# Patient Record
Sex: Female | Born: 1950
Health system: Southern US, Community
[De-identification: ages and names within clinical notes are randomized; demographics above are authoritative.]

## PROBLEM LIST (undated history)

## (undated) DIAGNOSIS — Z803 Family history of malignant neoplasm of breast: Secondary | ICD-10-CM

## (undated) DIAGNOSIS — I499 Cardiac arrhythmia, unspecified: Secondary | ICD-10-CM

## (undated) DIAGNOSIS — I10 Essential (primary) hypertension: Secondary | ICD-10-CM

## (undated) DIAGNOSIS — F419 Anxiety disorder, unspecified: Secondary | ICD-10-CM

## (undated) DIAGNOSIS — Z8049 Family history of malignant neoplasm of other genital organs: Secondary | ICD-10-CM

## (undated) DIAGNOSIS — N189 Chronic kidney disease, unspecified: Secondary | ICD-10-CM

## (undated) HISTORY — DX: Family history of malignant neoplasm of other genital organs: Z80.49

## (undated) HISTORY — DX: Hereditary hemochromatosis: E83.110

## (undated) HISTORY — DX: Family history of malignant neoplasm of breast: Z80.3

## (undated) HISTORY — DX: Essential (primary) hypertension: I10

## (undated) HISTORY — PX: WRIST SURGERY: SHX841

---

## 1981-07-04 HISTORY — PX: BREAST EXCISIONAL BIOPSY: SUR124

## 1998-12-30 ENCOUNTER — Other Ambulatory Visit: Admission: RE | Admit: 1998-12-30 | Discharge: 1998-12-30 | Payer: Self-pay | Admitting: Family Medicine

## 1999-09-10 ENCOUNTER — Encounter: Admission: RE | Admit: 1999-09-10 | Discharge: 1999-09-10 | Payer: Self-pay | Admitting: Family Medicine

## 1999-09-10 ENCOUNTER — Encounter: Payer: Self-pay | Admitting: Family Medicine

## 2000-09-11 ENCOUNTER — Encounter: Admission: RE | Admit: 2000-09-11 | Discharge: 2000-09-11 | Payer: Self-pay | Admitting: Family Medicine

## 2000-09-11 ENCOUNTER — Encounter: Payer: Self-pay | Admitting: Family Medicine

## 2001-07-11 ENCOUNTER — Other Ambulatory Visit: Admission: RE | Admit: 2001-07-11 | Discharge: 2001-07-11 | Payer: Self-pay | Admitting: Family Medicine

## 2001-07-12 ENCOUNTER — Encounter: Payer: Self-pay | Admitting: Family Medicine

## 2001-07-12 ENCOUNTER — Ambulatory Visit (HOSPITAL_COMMUNITY): Admission: RE | Admit: 2001-07-12 | Discharge: 2001-07-12 | Payer: Self-pay | Admitting: Family Medicine

## 2001-09-12 ENCOUNTER — Encounter: Payer: Self-pay | Admitting: Family Medicine

## 2001-09-12 ENCOUNTER — Encounter: Admission: RE | Admit: 2001-09-12 | Discharge: 2001-09-12 | Payer: Self-pay | Admitting: Family Medicine

## 2002-09-17 ENCOUNTER — Encounter: Payer: Self-pay | Admitting: Family Medicine

## 2002-09-17 ENCOUNTER — Encounter: Admission: RE | Admit: 2002-09-17 | Discharge: 2002-09-17 | Payer: Self-pay | Admitting: Family Medicine

## 2002-11-28 ENCOUNTER — Encounter: Payer: Self-pay | Admitting: Family Medicine

## 2002-11-28 ENCOUNTER — Encounter: Admission: RE | Admit: 2002-11-28 | Discharge: 2002-11-28 | Payer: Self-pay | Admitting: Family Medicine

## 2003-08-27 ENCOUNTER — Other Ambulatory Visit: Admission: RE | Admit: 2003-08-27 | Discharge: 2003-08-27 | Payer: Self-pay | Admitting: Family Medicine

## 2003-10-31 ENCOUNTER — Ambulatory Visit (HOSPITAL_COMMUNITY): Admission: RE | Admit: 2003-10-31 | Discharge: 2003-10-31 | Payer: Self-pay | Admitting: Gastroenterology

## 2004-08-27 ENCOUNTER — Other Ambulatory Visit: Admission: RE | Admit: 2004-08-27 | Discharge: 2004-08-27 | Payer: Self-pay | Admitting: Family Medicine

## 2005-02-11 ENCOUNTER — Encounter: Admission: RE | Admit: 2005-02-11 | Discharge: 2005-02-11 | Payer: Self-pay | Admitting: Family Medicine

## 2006-03-14 ENCOUNTER — Encounter: Admission: RE | Admit: 2006-03-14 | Discharge: 2006-03-14 | Payer: Self-pay | Admitting: Family Medicine

## 2007-03-19 ENCOUNTER — Encounter: Admission: RE | Admit: 2007-03-19 | Discharge: 2007-03-19 | Payer: Self-pay | Admitting: Family Medicine

## 2008-03-19 ENCOUNTER — Encounter: Admission: RE | Admit: 2008-03-19 | Discharge: 2008-03-19 | Payer: Self-pay | Admitting: Family Medicine

## 2009-03-20 ENCOUNTER — Encounter: Admission: RE | Admit: 2009-03-20 | Discharge: 2009-03-20 | Payer: Self-pay | Admitting: Family Medicine

## 2009-03-26 ENCOUNTER — Encounter: Admission: RE | Admit: 2009-03-26 | Discharge: 2009-03-26 | Payer: Self-pay | Admitting: Family Medicine

## 2009-04-27 ENCOUNTER — Other Ambulatory Visit: Admission: RE | Admit: 2009-04-27 | Discharge: 2009-04-27 | Payer: Self-pay | Admitting: Family Medicine

## 2009-09-18 ENCOUNTER — Encounter: Admission: RE | Admit: 2009-09-18 | Discharge: 2009-09-18 | Payer: Self-pay | Admitting: Family Medicine

## 2010-03-26 ENCOUNTER — Encounter: Admission: RE | Admit: 2010-03-26 | Discharge: 2010-03-26 | Payer: Self-pay | Admitting: Family Medicine

## 2010-11-19 NOTE — Op Note (Signed)
NAMEMCKENNA, GAMM                       ACCOUNT NO.:  [id number]   MEDICAL RECORD NO.:  [id number]                   PATIENT TYPE:  AMB   LOCATION:  ENDO                                 FACILITY:  [facility]   PHYSICIAN:  Petra Kuba, M.D.                 DATE OF BIRTH:  1951-04-24   DATE OF PROCEDURE:  10/31/2003  DATE OF DISCHARGE:                                 OPERATIVE REPORT   PROCEDURE:  Colonoscopy.   INDICATIONS FOR PROCEDURE:  Screening.   Consent was signed after risks, benefits, methods, and options were  thoroughly discussed in the office.   MEDICINES USED:  Demerol 70, Versed 7.   DESCRIPTION OF PROCEDURE:  Rectal inspection was pertinent for external  hemorrhoids, small. Digital exam was negative. The pediatric video  adjustable colonoscope was inserted and with abdominal pressure able to be  advanced around the colon to the cecum.  No abnormality was seen on  insertion. The cecum was identified by the appendiceal orifice and the  ileocecal valve. The prep was adequate. There was minimal liquid stool that  required washing and suctioning. On slow withdrawal through the colon, no  abnormalities were seen specifically no polyps, tumors, masses or  diverticula.  Once back in the rectum, anal rectal pullthrough and  retroflexion confirmed some small hemorrhoids.  The scope was reinserted a  short ways up the left side of the colon, air was suctioned, scope removed.  The patient tolerated the procedure well. There was no obvious or immediate  complications.   ENDOSCOPIC DIAGNOSIS:  1. Internal and external small hemorrhoids.  2. Otherwise within normal limits to the cecum.   PLAN:  Yearly rectals and guaiacs per Dr. Cliffton Asters.  Happy to see back p.r.n.  otherwise repeat screening in five years.                                               Petra Kuba, M.D.    MEM/MEDQ  D:  10/31/2003  T:  10/31/2003  Job:  [id number]   cc:   Stacie Acres. White, M.D.  [address]  [place]  [place] [address]  Fax: [phone]

## 2011-03-08 ENCOUNTER — Other Ambulatory Visit: Payer: Self-pay | Admitting: Family Medicine

## 2011-03-08 DIAGNOSIS — Z1231 Encounter for screening mammogram for malignant neoplasm of breast: Secondary | ICD-10-CM

## 2011-03-29 ENCOUNTER — Ambulatory Visit
Admission: RE | Admit: 2011-03-29 | Discharge: 2011-03-29 | Disposition: A | Discharge status: Home / Self Care | Payer: PRIVATE HEALTH INSURANCE | Source: Ambulatory Visit | Attending: Family Medicine | Admitting: Family Medicine

## 2011-03-29 DIAGNOSIS — Z1231 Encounter for screening mammogram for malignant neoplasm of breast: Secondary | ICD-10-CM

## 2012-04-12 ENCOUNTER — Other Ambulatory Visit: Payer: Self-pay | Admitting: Family Medicine

## 2012-04-12 DIAGNOSIS — Z1231 Encounter for screening mammogram for malignant neoplasm of breast: Secondary | ICD-10-CM

## 2012-05-09 ENCOUNTER — Ambulatory Visit
Admission: RE | Admit: 2012-05-09 | Discharge: 2012-05-09 | Disposition: A | Discharge status: Home / Self Care | Payer: BC Managed Care – PPO | Source: Ambulatory Visit | Attending: Family Medicine | Admitting: Family Medicine

## 2012-05-09 DIAGNOSIS — Z1231 Encounter for screening mammogram for malignant neoplasm of breast: Secondary | ICD-10-CM

## 2013-08-09 ENCOUNTER — Other Ambulatory Visit: Payer: Self-pay

## 2013-08-09 DIAGNOSIS — Z1231 Encounter for screening mammogram for malignant neoplasm of breast: Secondary | ICD-10-CM

## 2013-08-28 ENCOUNTER — Ambulatory Visit
Admission: RE | Admit: 2013-08-28 | Discharge: 2013-08-28 | Disposition: A | Discharge status: Home / Self Care | Payer: Managed Care, Other (non HMO) | Source: Ambulatory Visit

## 2013-08-28 DIAGNOSIS — Z1231 Encounter for screening mammogram for malignant neoplasm of breast: Secondary | ICD-10-CM

## 2013-11-20 ENCOUNTER — Other Ambulatory Visit (HOSPITAL_COMMUNITY)
Admission: RE | Admit: 2013-11-20 | Discharge: 2013-11-20 | Disposition: A | Discharge status: Home / Self Care | Payer: 59 | Source: Ambulatory Visit | Attending: Family Medicine | Admitting: Family Medicine

## 2013-11-20 ENCOUNTER — Other Ambulatory Visit: Payer: Self-pay | Admitting: Family Medicine

## 2013-11-20 DIAGNOSIS — Z0000 Encounter for general adult medical examination without abnormal findings: Secondary | ICD-10-CM | POA: Diagnosis not present

## 2013-12-05 ENCOUNTER — Telehealth: Payer: Self-pay | Admitting: Genetic Counselor

## 2013-12-05 NOTE — Telephone Encounter (Signed)
LEFT MESSAGE FOR PATIENT TO RETURN CALL TO SCHEDULE GENETIC APPT.  °

## 2013-12-11 ENCOUNTER — Telehealth: Payer: Self-pay | Admitting: Genetic Counselor

## 2013-12-11 NOTE — Telephone Encounter (Signed)
LEFT MESSAGE FOR PATIENT TO RETURN CALL TO SCHEDULE GENETIC APPT.  °

## 2013-12-24 ENCOUNTER — Telehealth: Payer: Self-pay | Admitting: Genetic Counselor

## 2013-12-24 NOTE — Telephone Encounter (Signed)
LEFT MESSAGE FOR PATIENT TO RETURN CALL TO SCHEDULE GENETIC APPT.  °

## 2014-01-30 ENCOUNTER — Encounter: Payer: Self-pay | Admitting: Genetic Counselor

## 2014-01-30 ENCOUNTER — Ambulatory Visit (HOSPITAL_BASED_OUTPATIENT_CLINIC_OR_DEPARTMENT_OTHER): Payer: Managed Care, Other (non HMO) | Admitting: Genetic Counselor

## 2014-01-30 ENCOUNTER — Other Ambulatory Visit: Payer: Managed Care, Other (non HMO)

## 2014-01-30 DIAGNOSIS — IMO0002 Reserved for concepts with insufficient information to code with codable children: Secondary | ICD-10-CM

## 2014-01-30 DIAGNOSIS — Z803 Family history of malignant neoplasm of breast: Secondary | ICD-10-CM | POA: Insufficient documentation

## 2014-01-30 DIAGNOSIS — Z8049 Family history of malignant neoplasm of other genital organs: Secondary | ICD-10-CM | POA: Insufficient documentation

## 2014-01-30 NOTE — Progress Notes (Signed)
Patient Name: Shelby Harrington Patient Age: 63 y.o. Encounter Date: 01/30/2014  Referring Physician: [name], MD    Shelby Harrington, a 63 y.o. female, is being seen at the [facility] due to a family history of breast and uterine cancers.  She presents to clinic today to discuss the possibility of a hereditary predisposition to cancer and discuss whether genetic testing is warranted.  HISTORY OF PRESENT ILLNESS: Shelby Harrington has no personal history of cancer and is in generally good health. She states she undergoes a yearly mammogram, clinical breast exam and gynecologic exam. She stated that a colonoscopy about 5 years ago revealed 3 polyps. We do not have pathology records available today.  Past Medical History  Diagnosis Date  . Family history of malignant neoplasm of breast   . Family history of uterine cancer     History   Social History  . Marital Status: Married    Spouse Name: N/A    Number of Children: N/A  . Years of Education: N/A   Social History Main Topics  . Smoking status: Not on file  . Smokeless tobacco: Not on file  . Alcohol Use: Not on file  . Drug Use: Not on file  . Sexual Activity: Not on file   Other Topics Concern  . Not on file   Social History Narrative  . No narrative on file     FAMILY HISTORY:   During the visit, a 4-generation pedigree was obtained. Significant diagnoses include the following:  Family History  Problem Relation Age of Onset  . Breast cancer Mother 43    deceased at 37  . Skin cancer Father     currently 32; no full siblings  . Uterine cancer Sister 71    currently 31  . Cancer Maternal Grandmother     "female cancer"; deceased 27    Shelby Harrington's only sister had uterine cancer at a young age (37). They are not in contact and she does not believe her sister had genetic testing. Shelby Harrington mother had no sisters; only two brothers. Her father had no full siblings at all.  Ms.  Harrington ancestry is English. There is no known Jewish ancestry and no consanguinity.  ASSESSMENT AND PLAN: Shelby Harrington is a 63 y.o. female with a family history of breast and uterine cancers. This history is not highly suggestive of a hereditary predisposition to cancer, but given her mother's young age at breast cancer diagnosis and paucity of women, genetic testing is recommended. We discussed BRCA1 and BRCA2, but also testing for Lynch syndrome due to her sister's uterine cancer and the "female cancer" she reported in her maternal grandmother. We reviewed the characteristics, features and inheritance patterns of hereditary cancer syndromes. We also discussed genetic testing, including the other appropriate family members to test (her sister), the process of testing, insurance coverage and implications of results.   Shelby Harrington wished to pursue genetic testing and a blood sample will be sent to [facility] for analysis of the 9 genes on the GYNPlus gene panel: BRCA1, BRCA2, EPCAM, MLH1, MSH2, MSH6, PMS2, PTEN, and TP53. We discussed the implications of a positive, negative and/ or Variant of Uncertain Significance (VUS) result. Results should be available in approximately 4-5 weeks, at which point we will contact her and address implications for her as well as address genetic testing for at-risk family members, if needed.    We encouraged Shelby Harrington to remain in contact with Cancer Genetics  annually so that we can update the family history and inform her of any changes in cancer genetics and testing that may be of benefit for this family. Ms.  Harrington questions were answered to her satisfaction today.   Thank you for the referral and allowing [place] to share in the care of your patient.   The patient was seen for a total of 30 minutes, greater than 50% of which was spent face-to-face counseling. This patient was discussed with the overseeing provider who agrees with the above.   Shelby Berg,  MS, [place] Certified Genetic Counseor phone: [phone] [email or web address]

## 2014-02-21 ENCOUNTER — Encounter: Payer: Self-pay | Admitting: Genetic Counselor

## 2014-02-21 NOTE — Progress Notes (Signed)
Referring Physician: [name], MD   Shelby Harrington was called today to discuss genetic test results. Please see the Genetics note from her visit on 01/30/14 for a detailed discussion of her personal and family history.  GENETIC TESTING: At the time of Shelby Harrington's visit, we recommended she pursue genetic testing of multiple genes on the GYNplus gene panel. This test, which included sequencing and deletion/duplication analysis of 9 genes, was performed at [facility]. Testing was normal and did not reveal a mutation in these genes. The genes tested were BRCA1, BRCA2, EPCAM, MLH1, MSH2, MSH6, PMS2, PTEN, and TP53.  We discussed with Shelby Harrington that since the current test is not perfect, it is possible there may be a gene mutation that current testing cannot detect, but that chance is small. We also discussed that it is possible that a different genetic factor, which was not part of this testing or has not yet been discovered, is responsible for the cancer diagnoses in the family. Lastly, it may be that there is an identifiable mutation in her family that she did not inherit. We continue to recommend that her sister who had uterine cancer at age 62 get tested. Unfortunately, Shelby Harrington is estranged from her sister.  CANCER SCREENING: This normal result is generally reassuring and indicates that Shelby Harrington does not likely have an increased risk of cancer due to a mutation in one of these genes. However, her risk is elevated about that of the general population simply due to the family history. We recommended Shelby Harrington continue to follow the cancer screening guidelines provided by her primary physician which includes a yearly mammogram, clinical breast exam and gynecologic exam. She stated that her colonoscopy 5 years ago revealed 3 polyps. We will defer to her GI as to when to have her next screening.  FAMILY MEMBERS: As stated, we recommended further genetic testing in Shelby Harrington family  as such testing might help [place] be even more confident in interpreting Shelby Harrington's own results. Genetic testing is best initiated in someone who has had cancer.  In this family, her sister who had uterine cancer would be the most informative person to test. Please let us know if we can help facilitate testing. Genetic counselors can be located in other cities, by visiting the website of the [employer] (ArtistMovie.se) and Field seismologist for a Dietitian by zip code.  Lastly, we discussed with Shelby Harrington that cancer genetics is a rapidly advancing field and it is possible that new genetic tests will be appropriate for her in the future. We encouraged her to remain in contact with [place] on an annual basis so we can update her personal and family histories, and let her know of advances in cancer genetics that may benefit the family. Our contact number was provided. Shelby Harrington questions were answered to her satisfaction today, and she knows she is welcome to call anytime with additional questions.    Steele Berg, MS, [place] Certified Genetic Counseor phone: [phone] [email or web address]

## 2014-05-21 ENCOUNTER — Encounter: Payer: Self-pay | Admitting: *Deleted

## 2014-10-13 ENCOUNTER — Other Ambulatory Visit: Payer: Self-pay

## 2014-10-13 DIAGNOSIS — Z1231 Encounter for screening mammogram for malignant neoplasm of breast: Secondary | ICD-10-CM

## 2014-10-22 ENCOUNTER — Ambulatory Visit
Admission: RE | Admit: 2014-10-22 | Discharge: 2014-10-22 | Disposition: A | Discharge status: Home / Self Care | Payer: 59 | Source: Ambulatory Visit

## 2014-10-22 DIAGNOSIS — Z1231 Encounter for screening mammogram for malignant neoplasm of breast: Secondary | ICD-10-CM

## 2015-02-05 ENCOUNTER — Encounter: Payer: Self-pay | Admitting: Physical Therapy

## 2015-02-05 ENCOUNTER — Ambulatory Visit: Payer: 59 | Attending: Urology | Admitting: Physical Therapy

## 2015-02-05 DIAGNOSIS — N8189 Other female genital prolapse: Secondary | ICD-10-CM

## 2015-02-05 NOTE — Therapy (Signed)
[facility] [address], [place], [place], [address] Phone: [phone]   Fax:  [phone]  Physical Therapy Evaluation  Patient Details  Name: Shelby Harrington MRN: [id number] Date of Birth: 1951-02-05 Referring Provider:  Bjorn Loser, MD  Encounter Date: 02/05/2015      PT End of Session - 02/05/15 1308    Visit Number 1   Date for PT Re-Evaluation 04/30/15   PT Start Time 1230   PT Stop Time 1308   PT Time Calculation (min) 38 min   Activity Tolerance Patient tolerated treatment well   Behavior During Therapy [facility] for tasks assessed/performed      Past Medical History  Diagnosis Date  . Family history of malignant neoplasm of breast   . Family history of uterine cancer   . Hypertension     History reviewed. No pertinent past surgical history.  There were no vitals filed for this visit.  Visit Diagnosis:  Pelvic floor weakness in female - Plan: PT plan of care cert/re-cert      Subjective Assessment - 02/05/15 1237    Subjective Patient reports urinary leakage for 5 years.  Patient reports leakage is getting worse over time and tired of dealing of it. Patient wears 1 pad per day. Pain with intercourse due to dryness.    Patient Stated Goals No leakage of urine   Currently in Pain? Yes   Pain Score 2    Pain Location Vagina   Pain Orientation Mid   Pain Descriptors / Indicators --  friction   Pain Type Chronic pain   Pain Onset More than a month ago   Pain Frequency Intermittent   Aggravating Factors  intercourse   Pain Relieving Factors no intercourse   Multiple Pain Sites No            [facility] PT Assessment - 02/05/15 0001    Assessment   Medical Diagnosis N39.3 female stress incontinence   Onset Date/Surgical Date 07/04/09   Prior Therapy None   Precautions   Precautions None   Balance Screen   Has the patient fallen in the past 6 months No   Has the patient had a decrease in  activity level because of a fear of falling?  No   Is the patient reluctant to leave their home because of a fear of falling?  No   Prior Function   Level of Independence Independent   Vocation Full time employment   Vocation Requirements sitting   Leisure recumbent bike 3-4x/wk for 35-45 min.    Cognition   Overall Cognitive Status Within Functional Limits for tasks assessed   Observation/Other Assessments   Focus on Therapeutic Outcomes (FOTO)  47% limitation for urinary problem   Posture/Postural Control   Posture/Postural Control Postural limitations   Postural Limitations Rounded Shoulders;Forward head   ROM / Strength   AROM / PROM / Strength AROM;Strength   AROM   Lumbar Extension decreased by 25%   Strength   Right Hip External Rotation  --  4/5   Right Hip Internal Rotation  --  4/5   Left Hip External Rotation  --  4/5   Left Hip Internal Rotation  --  4/5   Palpation   Palpation comment Pelvis in correct alignment   Balance   Balance Assessed --  single leg stance bil. 3 sec.                 Pelvic Floor Special Questions -  02/05/15 0001    Prior Pregnancies Yes   Number of Pregnancies 2   Number of Vaginal Deliveries 2   Episiotomy Performed Yes   Currently Sexually Active Yes   Is this Painful Yes  not all the time   Marinoff Scale discomfort that does not affect completion   Urinary Leakage Yes   Pad use 1   Activities that cause leaking Coughing;Sneezing   Urinary urgency Yes  when get up to go to the bathroom she waited too long   Urinary frequency every 3 hours   Fluid intake drinks mostly water   Falling out feeling (prolapse) No   Pelvic Floor Internal Exam Patient confirms identification and approves physical therapist to assess pelvic floor muscle strength and muscle integrity.    Exam Type Vaginal   Palpation tenderness located on right iliococcygeus and right puborectalis   Strength weak squeeze, no lift   Strength # of reps 3    Strength # of seconds 4                  PT Education - 02/05/15 1300    Education provided Yes   Education Details pelvic floor contraction; bladder irritants   Person(s) Educated Patient   Methods Explanation;Demonstration;Tactile cues;Verbal cues;Handout   Comprehension Verbalized understanding;Returned demonstration          PT Short Term Goals - 02/05/15 1315    PT SHORT TERM GOAL #1   Title understand what bladder irritants are and how they affect the bladder.    Time 4   Period Weeks   Status New   PT SHORT TERM GOAL #2   Title pain with intercourse decreased >/= 25%   Time 4   Period Weeks   Status New           PT Long Term Goals - 02/05/15 1316    PT LONG TERM GOAL #1   Title independent with HEP and understand how to progress herself.    Time 12   Period Weeks   Status New   PT LONG TERM GOAL #2   Title urinary leakage with coughing and sneezing decreased >/= 75%   Time 12   Period Weeks   Status New   PT LONG TERM GOAL #3   Title not have to wear a pad due to decreased urinary leakage   Time 12   Period Weeks   Status New   PT LONG TERM GOAL #4   Title pain with intercourse decreased >/= 50%   Time 12   Period Weeks   Status New   PT LONG TERM GOAL #5   Title pelvic floor strength >/= 4/5   Time 12   Period Weeks   Status New               Plan - 02/05/15 1310    Clinical Impression Statement Patient is a 64 year old female with stress incontinence for the past 5 years.  Patient reports she has pain with intercourse at level 2/10 vaginally due to dryness.  Pelvic floor strength is 2/5 with more posterior contraction and when patient laughs she will bear down.  Palpable tenderness located in right iliococcygeus and right puborectalis and posterior introitus. Patient reports she wears 1 pad per day. Patient leaks urine with coughing and sneezing and when she has been waiting to go to the bathroom too long. FOTO score for urinary  problem is 47% limitation.  Patient would benefit from physical  therapy to improve pelvic floor strenth to reduce urinary leakage and decrease pelvic pain with intercourse.    Pt will benefit from skilled therapeutic intervention in order to improve on the following deficits Pain;Decreased strength   Rehab Potential Excellent   PT Frequency 1x / week   PT Duration 12 weeks   PT Treatment/Interventions Biofeedback;Neuromuscular re-education;Therapeutic exercise;Therapeutic activities;Patient/family education;Manual techniques         Problem List Patient Active Problem List   Diagnosis Date Noted  . Family history of malignant neoplasm of breast   . Family history of uterine cancer     Londell Noll,PT 02/05/2015, 1:20 PM  [facility] [address], [place], [place], [address] Phone: [phone]   Fax:  [phone]

## 2015-02-05 NOTE — Patient Instructions (Signed)
Adduction: Hip - Knees Together (Hook-Lying)   Lie with hips and knees bent, towel roll between knees. Push knees together. Hold for _5__ seconds. Rest for _5__ seconds. Repeat _10__ times. Do _3__ times a day.  Then 5 quick flicks Copyright  VHI. All rights reserved.  Certain foods and liquids will decrease the pH making the urine more acidic.  Urinary urgency increases when the urine has a low pH.  Most common irritants: alcohol, carbonated beverages and caffinated beverages.  Foods to avoid: apple juice, apples, ascorbic acid, canteloupes, chili, citrus fruits, coffee, cranberries, grapes, guava, peaches, pepper, pineapple, plums, strawberries, tea, tomatoes, and vinegar.  Drinking plenty of water may help to increase the pH and dilute out any of the effects of specific irritants.  Foods that are NOT irritating to the bladder include: Pears, papayas, sun-brewed teas, watermelons, non-citrus herbal teas, apricots, kava and low-acid instant drinks (Postum)  [facility] [address], [place], [place] [address] Phone # [phone] Fax [phone]

## 2015-02-19 ENCOUNTER — Ambulatory Visit: Payer: 59 | Admitting: Physical Therapy

## 2015-02-19 ENCOUNTER — Encounter: Payer: Self-pay | Admitting: Physical Therapy

## 2015-02-19 DIAGNOSIS — N8189 Other female genital prolapse: Secondary | ICD-10-CM | POA: Diagnosis not present

## 2015-02-19 NOTE — Therapy (Signed)
[facility] [address] [place], [place], [address] Phone: [phone]   Fax:  [phone]  Physical Therapy Treatment  Patient Details  Name: Shelby Harrington MRN: [id number] Date of Birth: [birth year] Referring Provider:  Bjorn Loser, MD  Encounter Date: 02/19/2015      PT End of Session - 02/19/15 0802    Visit Number 2   Date for PT Re-Evaluation 04/30/15   PT Start Time 0800   PT Stop Time 0840   PT Time Calculation (min) 40 min   Activity Tolerance Patient tolerated treatment well   Behavior During Therapy [facility] for tasks assessed/performed      Past Medical History  Diagnosis Date  . Family history of malignant neoplasm of breast   . Family history of uterine cancer   . Hypertension     History reviewed. No pertinent past surgical history.  There were no vitals filed for this visit.  Visit Diagnosis:  Pelvic floor weakness in female      Subjective Assessment - 02/19/15 0803    Subjective I was only able to do exercises 2 times per day. My leakage feels better. My pads are more dry   Patient Stated Goals No leakage of urine   Currently in Pain? Yes   Pain Score 2    Pain Location Vagina   Pain Orientation Mid   Pain Descriptors / Indicators --  friction    Pain Type Chronic pain   Pain Onset More than a month ago   Pain Frequency Intermittent   Aggravating Factors  intercourse   Pain Relieving Factors No intercourse   Multiple Pain Sites No                      Pelvic Floor Special Questions - 02/19/15 0001    Pelvic Floor Internal Exam Patient confirms identification and approves physical therapist to assess pelvic floor muscle strength and muscle integrity.    Exam Type Vaginal   Palpation tenderness located in right obturator internist and puborectalis   Strength fair squeeze, definite lift  lift and circular           [facility] Adult PT Treatment/Exercise -  02/19/15 0001    Self-Care   Self-Care Other Self-Care Comments  lubricants and moisturizers for vaginal area, bladder irrita   Exercises   Exercises Lumbar   Lumbar Exercises: Supine   Ab Set 10 reps;5 seconds  pelvic floor contraction   Clam 10 reps  left, right, with pelvic floor contraction   Manual Therapy   Manual Therapy Internal Pelvic Floor   Internal Pelvic Floor to right obturator internist and puborectalis                PT Education - 02/19/15 0805    Education Details drinking water, bladder irritants, lubricants, moisturizers, pelvic floor contraction with abdominal bracing, pelvic floor contraction with clam, pelvic floor contraction with marching   Person(s) Educated Patient   Methods Explanation;Demonstration;Verbal cues;Handout   Comprehension Verbalized understanding;Returned demonstration          PT Short Term Goals - 02/19/15 0254    PT SHORT TERM GOAL #1   Title understand what bladder irritants are and how they affect the bladder.    Time 4   Period Weeks   Status Achieved   PT SHORT TERM GOAL #2   Title pain with intercourse decreased >/= 25%   Time 4   Period Weeks  Status On-going           PT Long Term Goals - 02/05/15 1316    PT LONG TERM GOAL #1   Title independent with HEP and understand how to progress herself.    Time 12   Period Weeks   Status New   PT LONG TERM GOAL #2   Title urinary leakage with coughing and sneezing decreased >/= 75%   Time 12   Period Weeks   Status New   PT LONG TERM GOAL #3   Title not have to wear a pad due to decreased urinary leakage   Time 12   Period Weeks   Status New   PT LONG TERM GOAL #4   Title pain with intercourse decreased >/= 50%   Time 12   Period Weeks   Status New   PT LONG TERM GOAL #5   Title pelvic floor strength >/= 4/5   Time 12   Period Weeks   Status New               Plan - 02/19/15 0834    Clinical Impression Statement Patient is a 64 year old  female with stress incontinence.  Patient pelvic floor strength has increased to 3/5 with circular contraction and lift.  Patient has achieved STG #1 due to understanding what bladder irritants are.  Patient is learning abdominal exercises. Patient reports decreased  urinary leakage but no change with coughing. Patient will benefit from physical therapy to increase pelvic floor strength and reduce urinary leakage.    Pt will benefit from skilled therapeutic intervention in order to improve on the following deficits Pain;Decreased strength   Rehab Potential Excellent   Clinical Impairments Affecting Rehab Potential None   PT Frequency 1x / week   PT Duration 12 weeks   PT Treatment/Interventions Biofeedback;Neuromuscular re-education;Therapeutic exercise;Therapeutic activities;Patient/family education;Manual techniques   PT Next Visit Plan soft tissue work, abdominal exercise, hip exercise   PT Home Exercise Plan progress as needed   Recommended Other Services None   Consulted and Agree with Plan of Care Patient        Problem List Patient Active Problem List   Diagnosis Date Noted  . Family history of malignant neoplasm of breast   . Family history of uterine cancer     Sharisse Rantz,PT 02/19/2015, 8:39 AM  [facility] [address] [place], [place], [address] Phone: [phone]   Fax:  [phone]

## 2015-02-19 NOTE — Patient Instructions (Addendum)
Lubrication . Used for intercourse to reduce friction . Avoid ones that have glycerin, warming gels, tingling gels, icing or cooling gel, scented . May need to be reapplied once or several times during sexual activity . Can be applied to both partners genitals prior to vaginal penetration to minimize friction or irritation . Prevent irritation and mucosal tears that cause post coital pain and increased the risk of vaginal and urinary tract infections . Oil-based lubricants cannot be used with condoms due to breaking them down.  Least likely to irritate vaginal tissue.  . Plant based-lubes are safe . Silicone-based lubrication are thicker and last long and used for post-menopausal women Types of Lubricants . Good Clean Love . Slippery Stuff . Sylk . Blossom Organics . Samul Dada . Coconut oil . Aloe Vera . Sliquid  Moisturizers . They are used in the vagina to hydrate the mucous membrane that make up the vaginal canal. . Designed to keep a more normal acid balance (ph) . Once placed in the vagina, it will last between two to three days.  . Use 2-3 times per week at bedtime and last longer than 60 min. . Ingredients to avoid is glycerin and fragrance, can increase chance of infection . Should not be used just before sex due to causing irritation . Most are gels administered either in a tampon-shaped applicator or as a vaginal suppository. They are non-hormonal.   Types of Moisturizers . Replens- inserted . Luvena- inserted . Vitamin E vaginal suppositories . Moist Again- inserted . Feminease- inserted . Coconut oil- can break down condoms . Hyalo- inserted  Things to avoid in the vaginal area . Do not use things to irritate the vulvar area . No lotions . No soaps; can use Aveeno or Calendula cleanser if needed. Must be gentle . No deodorants . No douches . Good to sleep without underwear to let the vaginal area to air out . No scrubbing: spread the lips to let warm water rinse  over labias and pat dry .   Lower abdominal/core stability exercises  1. Practice your breathing technique: Inhale through your nose expanding your belly and rib cage. Try not to breathe into your chest. Exhale slowly and gradually out your mouth feeling a sense of softness to your body. Practice multiple times. This can be performed unlimited.  2. Finding the lower abdominals. Laying on your back with the knees bent, place your fingers just below your belly button. Using your breathing technique from above, on your exhale gently pull the belly button away from your fingertips without tensing any other muscles. Practice this 5x. Next, as you exhale, draw belly button inwards and hold onto it...then feel as if you are pulling that muscle across your pelvis like you are tightening a belt. This can be hard to do at first so be patient and practice. Do 5-10 reps 1-3 x day. Always recognize quality over quantity; if your abdominal muscles become tired you will notice you may tighten/contract other muscles. This is the time to take a break.   Practice this first laying on your back, then in sitting, progressing to standing and finally adding it to all your daily movements.   3. Finding your pelvic floor. Using the breathing technique above, when your exhale, this time draw your pelvic floor muscles up as if you were attempting to stop the flow of urination. Be careful NOT to tense any other muscles. This can be hard, BE PATIENT. Try to hold up to 10 seconds  repeating 10x. Try 2x a day. Once you feel you are doing this well, add this contraction to exercise #2. First contracting your pelvic floor followed by lower abdominals.   4. Adding leg movements. Add the following leg movements to challenge your ability to keep your core stable:  1. Single leg drop outs: Laying on your back with knees bent feet flat. Inhale,  dropping one knee outward KEEPING YOUR PELVIS STILL. Exhale as you bring the leg back,  simultaneously performing your lower abdominal contraction. Do 5-10 on each leg.   2. Marching: While keeping your pelvis still, lift the right foot a few inches, put it down then lift left foot. This will mimic a march. Start slow to establish control. Once you have control you may speed it up. Do 10-20x. You MUST keep your lower abdominlas contracted while you march. Breathe naturally    [facility] [address], [place], [place] [address] Phone # [phone] Fax [phone]

## 2015-03-04 ENCOUNTER — Encounter: Payer: 59 | Admitting: Physical Therapy

## 2015-03-11 ENCOUNTER — Encounter: Payer: Self-pay | Admitting: Physical Therapy

## 2015-03-11 ENCOUNTER — Ambulatory Visit: Payer: 59 | Attending: Urology | Admitting: Physical Therapy

## 2015-03-11 DIAGNOSIS — N8189 Other female genital prolapse: Secondary | ICD-10-CM | POA: Diagnosis not present

## 2015-03-11 NOTE — Therapy (Signed)
[facility] [address] [place], [place], [address] Phone: [phone]   Fax:  [phone]  Physical Therapy Treatment  Patient Details  Name: Shelby Harrington MRN: [id number] Date of Birth: 1950-12-12 Referring Provider:  Bjorn Loser, MD  Encounter Date: 03/11/2015      PT End of Session - 03/11/15 1235    Visit Number 3   Date for PT Re-Evaluation 04/30/15   PT Start Time 1230   PT Stop Time 1310   PT Time Calculation (min) 40 min   Activity Tolerance Patient tolerated treatment well   Behavior During Therapy [facility] for tasks assessed/performed      Past Medical History  Diagnosis Date  . Family history of malignant neoplasm of breast   . Family history of uterine cancer   . Hypertension     History reviewed. No pertinent past surgical history.  There were no vitals filed for this visit.  Visit Diagnosis:  Pelvic floor weakness in female      Subjective Assessment - 03/11/15 1235    Subjective Things are good.  2 days last week without urinary leakage. Today is my last visit due to insurance not going to pay.    Patient Stated Goals No leakage of urine   Currently in Pain? No/denies            [facility] PT Assessment - 03/11/15 0001    Assessment   Medical Diagnosis N39.3 female stress incontinence   Onset Date/Surgical Date 07/04/09   Prior Therapy None   Precautions   Precautions None   Balance Screen   Has the patient fallen in the past 6 months No   Has the patient had a decrease in activity level because of a fear of falling?  No   Is the patient reluctant to leave their home because of a fear of falling?  No   Prior Function   Level of Independence Independent   Cognition   Overall Cognitive Status Within Functional Limits for tasks assessed   Observation/Other Assessments   Focus on Therapeutic Outcomes (FOTO)  46% limitation                              PT Education - 03/11/15 1302    Education provided Yes   Education Details pelvic floor strengthening and how to progress her exercises, how to use a tampon to see if she is contracting pelvic floor correctly, hip theraband exercises   Person(s) Educated Patient   Methods Explanation;Demonstration;Tactile cues;Verbal cues;Handout   Comprehension Returned demonstration;Verbalized understanding          PT Short Term Goals - 03/11/15 1313    PT SHORT TERM GOAL #1   Title understand what bladder irritants are and how they affect the bladder.    Time 4   Period Weeks   Status Achieved   PT SHORT TERM GOAL #2   Title pain with intercourse decreased >/= 25%   Time 4   Period Weeks   Status Achieved           PT Long Term Goals - 03/11/15 1305    PT LONG TERM GOAL #1   Title independent with HEP and understand how to progress herself.    Time 12   Period Weeks   Status Achieved   PT LONG TERM GOAL #2   Title urinary leakage with coughing and sneezing decreased >/= 75%  Time 12   Period Weeks   Status Achieved   PT LONG TERM GOAL #3   Title not have to wear a pad due to decreased urinary leakage   Time 12   Period Weeks   Status Not Met  1 pad   PT LONG TERM GOAL #4   Title pain with intercourse decreased >/= 50%   Time 12   Period Weeks   Status Achieved   PT LONG TERM GOAL #5   Title pelvic floor strength >/= 4/5   Time 12   Period Weeks   Status Partially Met               Problem List Patient Active Problem List   Diagnosis Date Noted  . Family history of malignant neoplasm of breast   . Family history of uterine cancer     Wing Gfeller,PT 03/11/2015, 1:15 PM  [facility] [address] [place], [place], [address] Phone: [phone]   Fax:  [phone]  PHYSICAL THERAPY DISCHARGE SUMMARY  Visits from Start of Care: 3 Current functional level related to goals / functional  outcomes: See above. Patient will wear 1 pad instead of no pads.  Pelvic floor strength is 3/5 instead of 4/5.    Remaining deficits: See above.    Education / Equipment: HEP Plan: Patient agrees to discharge.  Patient goals were partially met. Patient is being discharged due to financial reasons. Thank you for the referral. Earlie Counts, PT 03/11/2015 1:17 PM

## 2015-03-11 NOTE — Patient Instructions (Signed)
Quick Contraction: Gravity Resisted (Sitting)   Sitting, quickly squeeze then fully relax pelvic floor. Perform __1_ sets of _5__. Rest for __1_ seconds between sets. Do _2__ times a day.  Copyright  VHI. All rights reserved.  Slow Contraction: Gravity Resisted (Sitting)   Sitting, slowly squeeze pelvic floor for _10-15__ seconds. Rest for _5__ seconds. Repeat __10_ times. Do _3__ times a day.  Copyright  VHI. All rights reserved.   Once you can do the above exercises without difficulty progress to standing.  Quick Contraction: Gravity Resisted (Standing)   Standing, quickly squeeze then fully relax pelvic floor. Perform _1__ sets of _5__. Rest for _1__ seconds between sets. Do __2_ times a day.  Copyright  VHI. All rights reserved.  Slow Contraction: Gravity Resisted (Standing)   Standing, slowly squeeze pelvic floor for _10__ seconds. Rest for _5__ seconds. Repeat _10__ times. Do __2_ times a day.  Copyright  VHI. All rights reserved.    Functional tasks: Cough: Phase 4 (Standing)   Squeeze pelvic floor and hold. Inhale. Lean shoulders forward. Cough repeatedly. Relax. Repeat _3__ times. Do _1__ times a day.  Copyright  VHI. All rights reserved.  Laugh: Phase 4 (Sitting)   Squeeze pelvic floor and hold. Inhale. Lean shoulders forward. Laugh repeatedly. Relax. Repeat _3__ times. Do _1__ times a day.  Copyright  VHI. All rights reserved.  Sneeze: Phase 4 (Sitting)   Squeeze pelvic floor and hold. Inhale. Lean shoulders forward. Pretend to sneeze repeatedly. Relax. Repeat _3__ times. Do _1__ times a day.  Copyright  VHI. All rights reserved.      Strengthening: Hip Abduction - Resisted   With tubing around right leg, other side toward anchor, extend leg out from side. Repeat _10___ times per set. Do _1___ sets per session. Do __1__ sessions per day.  http://orth.exer.us/634   Copyright  VHI. All rights reserved.  Strengthening: Hip Extension -  Resisted   With tubing around right ankle, face anchor and pull leg straight back. Repeat __10__ times per set. Do ___1_ sets per session. Do __1__ sessions per day.  http://orth.exer.us/636   Copyright  VHI. All rights reserved.  Strengthening: Hip Adduction - Resisted   With tubing around left leg, bring leg across body. Repeat _10___ times per set. Do _1___ sets per session. Do _1___ sessions per day.  http://orth.exer.us/632   Copyright  VHI. All rights reserved.  [facility] [address] [place] [employer] [place], [place] [address] Phone # [phone] Fax [phone]

## 2015-08-04 ENCOUNTER — Other Ambulatory Visit: Payer: Self-pay | Admitting: Family Medicine

## 2015-08-04 ENCOUNTER — Ambulatory Visit
Admission: RE | Admit: 2015-08-04 | Discharge: 2015-08-04 | Disposition: A | Discharge status: Home / Self Care | Payer: 59 | Source: Ambulatory Visit | Attending: Family Medicine | Admitting: Family Medicine

## 2015-08-04 DIAGNOSIS — R059 Cough, unspecified: Secondary | ICD-10-CM

## 2015-08-04 DIAGNOSIS — R05 Cough: Secondary | ICD-10-CM

## 2015-08-25 ENCOUNTER — Other Ambulatory Visit: Payer: Self-pay | Admitting: Family Medicine

## 2015-08-25 ENCOUNTER — Ambulatory Visit
Admission: RE | Admit: 2015-08-25 | Discharge: 2015-08-25 | Disposition: A | Discharge status: Home / Self Care | Payer: 59 | Source: Ambulatory Visit | Attending: Family Medicine | Admitting: Family Medicine

## 2015-08-25 DIAGNOSIS — J189 Pneumonia, unspecified organism: Secondary | ICD-10-CM

## 2015-09-15 ENCOUNTER — Encounter: Payer: Self-pay | Admitting: Internal Medicine

## 2015-09-15 ENCOUNTER — Ambulatory Visit (INDEPENDENT_AMBULATORY_CARE_PROVIDER_SITE_OTHER): Payer: 59 | Admitting: Internal Medicine

## 2015-09-15 VITALS — BP 160/90 | HR 74 | Ht 59.75 in | Wt 162.0 lb

## 2015-09-15 DIAGNOSIS — J45991 Cough variant asthma: Secondary | ICD-10-CM | POA: Diagnosis not present

## 2015-09-15 DIAGNOSIS — R0600 Dyspnea, unspecified: Secondary | ICD-10-CM | POA: Diagnosis not present

## 2015-09-15 MED ORDER — BENZONATATE 200 MG PO CAPS: 200.0000 mg | ORAL_CAPSULE | Freq: Three times a day (TID) | ORAL | Status: DC | PRN

## 2015-09-15 NOTE — Progress Notes (Signed)
Subjective:    Patient ID: Shelby Harrington, female    DOB: [birth year]    MRN: [id number]  HPI  32 yowf never smoker ? 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    09/15/2015  1st [facility] office visit/ Shelby Harrington   Chief Complaint  Patient presents with  . Pulmonary Consult    Referred by Dr. [name]. Pt c/o SOB and cough x 1 yr. Cough has been worse for the past month. Cough is non prod and worse when she lies down and when she talks alot. She gets winded walking up and incline or doing housework such as making her bed.       doe chronic / daily   = MMRC1 = can walk nl pace, flat grade, can't hurry or go uphills or steps s sob  And so far no better on symbicort though hfa technique not ideal (see a/p) and on ppi but not sure of dose, taking pc  No obvious other patterns in day to day or daytime variabilty or assoc excess/ purulent sputum or mucus plugs   or cp or chest tightness, subjective wheeze overt sinus or hb symptoms. No unusual exp hx or h/o childhood pna/ asthma or knowledge of premature birth.  Sleeping ok without nocturnal  or early am exacerbation  of respiratory  c/o's or need for noct saba. Also denies any obvious fluctuation of symptoms with weather or environmental changes or other aggravating or alleviating factors except as outlined above   Current Medications, Allergies, Complete Past Medical History, Past Surgical History, Family History, and Social History were reviewed in [employer] record.              Review of Systems  Constitutional: Negative for  fever, chills and unexpected weight change.  HENT: Positive for sore throat. Negative for congestion, dental problem, ear pain, nosebleeds, postnasal drip, rhinorrhea, sinus pressure, sneezing, trouble swallowing and voice change.   Eyes: Negative for visual disturbance.  Respiratory: Positive for cough and shortness of breath. Negative for choking.   Cardiovascular: Negative for chest pain and leg swelling.  Gastrointestinal: Negative for vomiting, abdominal pain and diarrhea.  Genitourinary: Negative for difficulty urinating.  Musculoskeletal: Negative for arthralgias.  Skin: Negative for rash.  Neurological: Negative for tremors, syncope and headaches.  Hematological: Does not bruise/bleed easily.       Objective:   Physical Exam  amb wf nad  Wt Readings from Last 3 Encounters:  09/15/15 162 lb (73.483 kg)    Vital signs reviewed  HEENT: nl dentition, turbinates, and oropharynx. Nl external ear canals without cough reflex   NECK :  without JVD/Nodes/TM/ nl carotid upstrokes bilaterally   LUNGS: no acc muscle use,  Nl contour chest which is clear to A and P bilaterally without cough on insp or exp maneuvers   CV:  RRR  no s3 or murmur or increase in P2, no edema   ABD:  soft and nontender with nl inspiratory excursion in the supine position. No bruits or organomegaly, bowel sounds nl  MS:  Nl gait/ ext  warm without deformities, calf tenderness, cyanosis or clubbing No obvious joint restrictions   SKIN: warm and dry without lesions    NEURO:  alert, approp, nl sensorium with  no motor deficits       I personally reviewed images and agree with radiology impression as follows:  CXR:  08/25/15 No acute cardiopulmonary disease.        Assessment & Plan:

## 2015-09-15 NOTE — Patient Instructions (Addendum)
Omeprazole should be 40 mg Take 30-60 min before first meal of the day and take pepcid ac 20 mg one hour before bed with zyrtec   GERD (REFLUX)  is an extremely common cause of respiratory symptoms just like yours , many times with no obvious heartburn at all.    It can be treated with medication, but also with lifestyle changes including elevation of the head of your bed (ideally with 6 inch  bed blocks),  Smoking cessation, avoidance of late meals, excessive alcohol, and avoid fatty foods, chocolate, peppermint, colas, red wine, and acidic juices such as orange juice.  NO MINT OR MENTHOL PRODUCTS SO NO COUGH DROPS  USE SUGARLESS CANDY INSTEAD (Jolley ranchers or Stover's or Life Savers) or even ice chips will also do - the key is to swallow to prevent all throat clearing. NO OIL BASED VITAMINS - use powdered substitutes.  Continue symbicort 160 Take 2 puffs first thing in am and then another 2 puffs about 12 hours later (take pm dose at least 15 min before your exercise and if not better after 2 weeks stop it  Work on inhaler technique:  relax and gently blow all the way out then take a nice smooth deep breath back in, triggering the inhaler at same time you start breathing in.  Hold for up to 5 seconds if you can. Blow out thru nose. Rinse and gargle with water when done  For cough > tessilon 200 mg up to every 8 hours as needed (won't make you sleepy)  Please schedule a follow up office visit in 4 weeks, sooner if needed

## 2015-09-16 ENCOUNTER — Encounter: Payer: Self-pay | Admitting: Internal Medicine

## 2015-09-16 DIAGNOSIS — R0600 Dyspnea, unspecified: Secondary | ICD-10-CM | POA: Insufficient documentation

## 2015-09-16 DIAGNOSIS — J45991 Cough variant asthma: Secondary | ICD-10-CM | POA: Insufficient documentation

## 2015-09-16 NOTE — Assessment & Plan Note (Signed)
- The proper method of use, as well as anticipated side effects, of a metered-dose inhaler are discussed and demonstrated to the patient. Improved effectiveness after extensive coaching during this visit to a level of approximately 75 % from a baseline of 50 %   The most common causes of chronic cough in immunocompetent adults include the following: upper airway cough syndrome (UACS), previously referred to as postnasal drip syndrome (PNDS), which is caused by variety of rhinosinus conditions; (2) asthma; (3) GERD; (4) chronic bronchitis from cigarette smoking or other inhaled environmental irritants; (5) nonasthmatic eosinophilic bronchitis; and (6) bronchiectasis.   These conditions, singly or in combination, have accounted for up to 94% of the causes of chronic cough in prospective studies.   Other conditions have constituted no >6% of the causes in prospective studies These have included bronchogenic carcinoma, chronic interstitial pneumonia, sarcoidosis, left ventricular failure, ACEI-induced cough, and aspiration from a condition associated with pharyngeal dysfunction.    Chronic cough is often simultaneously caused by more than one condition. A single cause has been found from 38 to 82% of the time, multiple causes from 18 to 62%. Multiply caused cough has been the result of three diseases up to 42% of the time.       Based on hx and exam, this is most likely:   Cough variant asthma refractory to symbicort  vs  Upper airway cough syndrome, so named because it's frequently impossible to sort out how much is  CR/sinusitis with freq throat clearing (which can be related to primary GERD)   vs  causing  secondary (" extra esophageal")  GERD from wide swings in gastric pressure that occur with throat clearing, often  promoting self use of mint and menthol lozenges that reduce the lower esophageal sphincter tone and exacerbate the problem further in a cyclical fashion.   These are the same pts (now  being labeled as having "irritable larynx syndrome" by some cough centers) who not infrequently have a history of having failed to tolerate ace inhibitors,  dry powder inhalers or biphosphonates or report having atypical reflux symptoms that don't respond to standard doses of PPI , and are easily confused as having aecopd or asthma flares by even experienced allergists/ pulmonologists.   The first step is to maximize GERD Rx and eliminate cyclical coughing with tessilon if possible  then regroup in 4 weeks.  To sort out whether this is cough variant asthma rec max rx with symbicort x 2 weeks then try back off- may need methacholine challenge testing to sort out.  I had an extended discussion with the patient reviewing all relevant studies completed to date and  lasting 35 min  1) Explained: The standardized cough guidelines published in Chest by Lissa Morales in 2006 are still the best available and consist of a multiple step process (up to 12!) , not a single office visit,  and are intended  to address this problem logically,  with an alogrithm dependent on response to empiric treatment at  each progressive step  to determine a specific diagnosis with  minimal addtional testing needed. Therefore if adherence is an issue or can't be accurately verified,  it's very unlikely the standard evaluation and treatment will be successful here.    Furthermore, response to therapy (other than acute cough suppression, which should only be used short term with avoidance of narcotic containing cough syrups if possible), can be a gradual process for which the patient may perceive immediate benefit.  Unlike going  to an eye doctor where the best perscription is almost always the first one and is immediately effective, this is almost never the case in the management of chronic cough syndromes. Therefore the patient needs to commit up front to consistently adhere to recommendations  for up to 6 weeks of therapy directed at  the likely underlying problem(s) before the response can be reasonably evaluated.     2) Each maintenance medication was reviewed in detail including most importantly the difference between maintenance and prns and under what circumstances the prns are to be triggered using an action plan format that is not reflected in the computer generated alphabetically organized AVS.    Please see instructions for details which were reviewed in writing and the patient given a copy highlighting the part that I personally wrote and discussed at today's ov.   See instructions for specific recommendations which were reviewed directly with the patient who was given a copy with highlighter outlining the key components.

## 2015-09-16 NOTE — Assessment & Plan Note (Signed)
Reported lifelong and will need pfts to sort out once we have the cough under control but in the meantime encouraged continued daily (if possible) ex on recumbent bike which neutralizes the component that is related to obesity.

## 2015-09-21 ENCOUNTER — Encounter: Payer: Self-pay | Admitting: Hematology & Oncology

## 2015-09-30 ENCOUNTER — Other Ambulatory Visit: Payer: Self-pay

## 2015-09-30 DIAGNOSIS — Z1231 Encounter for screening mammogram for malignant neoplasm of breast: Secondary | ICD-10-CM

## 2015-10-08 ENCOUNTER — Ambulatory Visit: Payer: 59

## 2015-10-08 ENCOUNTER — Encounter: Payer: Self-pay | Admitting: Hematology & Oncology

## 2015-10-08 ENCOUNTER — Other Ambulatory Visit (HOSPITAL_BASED_OUTPATIENT_CLINIC_OR_DEPARTMENT_OTHER): Payer: 59

## 2015-10-08 ENCOUNTER — Ambulatory Visit (HOSPITAL_BASED_OUTPATIENT_CLINIC_OR_DEPARTMENT_OTHER): Payer: 59 | Admitting: Hematology & Oncology

## 2015-10-08 LAB — COMPREHENSIVE METABOLIC PANEL
ALBUMIN: 4 g/dL (ref 3.5–5.0)
ALK PHOS: 96 U/L (ref 40–150)
ALT: 29 U/L (ref 0–55)
AST: 28 U/L (ref 5–34)
Anion Gap: 8 mEq/L (ref 3–11)
BILIRUBIN TOTAL: 0.3 mg/dL (ref 0.20–1.20)
BUN: 17.4 mg/dL (ref 7.0–26.0)
CO2: 27 meq/L (ref 22–29)
Calcium: 9.4 mg/dL (ref 8.4–10.4)
Chloride: 109 mEq/L (ref 98–109)
Creatinine: 1.1 mg/dL (ref 0.6–1.1)
EGFR: 55 mL/min/{1.73_m2} — AB (ref >90)
GLUCOSE: 89 mg/dL (ref 70–140)
Potassium: 4.1 mEq/L (ref 3.5–5.1)
SODIUM: 143 meq/L (ref 136–145)
TOTAL PROTEIN: 7.3 g/dL (ref 6.4–8.3)

## 2015-10-08 LAB — CBC WITH DIFFERENTIAL (CANCER CENTER ONLY)
BASO#: 0.1 10*3/uL (ref 0.0–0.2)
BASO%: 0.7 % (ref 0.0–2.0)
EOS ABS: 0.3 10*3/uL (ref 0.0–0.5)
EOS%: 2.9 % (ref 0.0–7.0)
HEMATOCRIT: 48.7 % — AB (ref 34.8–46.6)
HEMOGLOBIN: 17.3 g/dL — AB (ref 11.6–15.9)
LYMPH#: 2.9 10*3/uL (ref 0.9–3.3)
LYMPH%: 33 % (ref 14.0–48.0)
MCH: 31.9 pg (ref 26.0–34.0)
MCHC: 35.5 g/dL (ref 32.0–36.0)
MCV: 90 fL (ref 81–101)
MONO#: 0.7 10*3/uL (ref 0.1–0.9)
MONO%: 7.8 % (ref 0.0–13.0)
NEUT#: 4.9 10*3/uL (ref 1.5–6.5)
NEUT%: 55.6 % (ref 39.6–80.0)
Platelets: 192 10*3/uL (ref 145–400)
RBC: 5.43 10*6/uL — AB (ref 3.70–5.32)
RDW: 12.4 % (ref 11.1–15.7)
WBC: 8.7 10*3/uL (ref 3.9–10.0)

## 2015-10-08 LAB — CHCC SATELLITE - SMEAR

## 2015-10-08 NOTE — Progress Notes (Signed)
Referral MD  Reason for Referral: Heterozygous hemochromatosis-H63D  Chief Complaint  Patient presents with  . Other    New Patient  : My blood is high.   HPI: Ms. Doughten is a very nice 65 year old white female. She has been in pretty good health. She currently is doing with some cough. She may have some reflux.  She has been is found to have a mutation for hemochromatosis. Not sure as to why this test was actually done. I cannot find any iron studies that have been done on her. However, she was found to be heterozygous for one minor mutations for hemochromatosis. She has one gene for H63D mutation.  She has been noted to have some erythrocytosis. Go back to 2014, she had a minimally elevated hemoglobin and hematocrit. Her platelet count and white cell count were okay.  She stopped her monthly cycles back when she was 65 years old.  There is no obvious family history of hemochromatosis. Her mother died of breast cancer. There is uterine cancer in the family.  In June 2015, her hemoglobin was 15.6 hematocrit 47.8.  She's had no joint problems outside of back issues.  She does not smoke. She has an occasional glass of wine.  She does have some secondhand smoke exposure. This is back when she started working.  She really has not had any surgery.  Overall, her performance status is ECOG 0.    Past Medical History  Diagnosis Date  . Family history of malignant neoplasm of breast   . Family history of uterine cancer   . Hypertension   :  No past surgical history on file.:   Current outpatient prescriptions:  .  aspirin 81 MG tablet, Take 81 mg by mouth daily., Disp: , Rfl:  .  budesonide-formoterol (SYMBICORT) 160-4.5 MCG/ACT inhaler, Inhale 2 puffs into the lungs 2 (two) times daily., Disp: , Rfl:  .  CALCIUM PO, Take 1 capsule by mouth daily., Disp: , Rfl:  .  cetirizine (ZYRTEC) 10 MG tablet, Take 10 mg by mouth daily., Disp: , Rfl:  .  Cholecalciferol (SM VITAMIN D3)  4000 units CAPS, Take 1 capsule by mouth daily., Disp: , Rfl:  .  cyclobenzaprine (FLEXERIL) 10 MG tablet, Take 10 mg by mouth at bedtime as needed for muscle spasms. , Disp: , Rfl:  .  esomeprazole (NEXIUM) 40 MG capsule, Take 40 mg by mouth daily at 12 noon., Disp: , Rfl:  .  fluticasone (FLONASE) 50 MCG/ACT nasal spray, Place 2 sprays into both nostrils daily., Disp: , Rfl:  .  Glucosamine-Chondroitin (GLUCOSAMINE CHONDR COMPLEX PO), Take 1 capsule by mouth daily., Disp: , Rfl:  .  metoprolol succinate (TOPROL-XL) 25 MG 24 hr tablet, Take 25 mg by mouth daily., Disp: , Rfl:  .  Multiple Vitamins-Minerals (CENTRUM SILVER PO), Take 1 tablet by mouth daily., Disp: , Rfl:  .  benzonatate (TESSALON) 200 MG capsule, Take 1 capsule (200 mg total) by mouth 3 (three) times daily as needed for cough. (Patient not taking: Reported on 10/08/2015), Disp: 40 capsule, Rfl: 2:  :  Allergies  Allergen Reactions  . Codeine   . Latex   . Penicillins   . Prednisone Palpitations  :  Family History  Problem Relation Age of Onset  . Breast cancer Mother 51    deceased at 23  . Hypertension Mother   . Skin cancer Father     currently 22; no full siblings  . Hypertension Father   . Diabetes  Father   . Uterine cancer Sister 40    currently 66  . Emphysema Mother     smoked  :  Social History   Social History  . Marital Status: Married    Spouse Name: N/A  . Number of Children: N/A  . Years of Education: N/A   Occupational History  . Not on file.   Social History Main Topics  . Smoking status: Never Smoker   . Smokeless tobacco: Never Used  . Alcohol Use: 0.0 oz/week    0 Standard drinks or equivalent per week     Comment: glass of wine on the weekends  . Drug Use: No  . Sexual Activity: Not on file   Other Topics Concern  . Not on file   Social History Narrative  :  Pertinent items are noted in HPI.  Exam: @[facility]   Well-developed and well-nourished white female in no obvious  distress. Vital signs show a temperature of 98.2. Pulse 60. Blood pressure 166/77. Weight is 106 1 pounds. Head and neck exam shows no ocular or oral lesions. There are no palpable cervical or supraclavicular lymph nodes. Lungs are clear. Cardiac exam regular rate and rhythm with no murmurs, rubs or bruits. Abdomen is soft. She has good bowel sounds. There is no fluid wave. There is no palpable liver or spleen tip. Back exam shows no tenderness over the spine, ribs or hips. Extremities shows no clubbing, cyanosis or edema. Neurological exam shows no focal neurological deficits. Skin exam shows no rashes, ecchymoses or petechia.       Recent Labs  10/08/15 1316  WBC 8.7  HGB 17.3*  HCT 48.7*  PLT 192   No results for input(s): NA, K, CL, CO2, GLUCOSE, BUN, CREATININE, CALCIUM in the last 72 hours.  Blood smear review:  None   Pathology: None     Assessment and Plan:  Ms. Hoselton is a 65 year old white female. She is heterozygous for a minor mutation for hemochromatosis.  She does have some slight erythrocytosis.  I really don't suspect that she has any hematologic malignancy. I don't believe she has any myeloproliferative disorder. Her hemoglobin has been slightly elevated.  We will have to see what her iron studies show. This will be helpful.  We will see what her ferritin is. I cannot imagine that it would be all that high. Again she is only heterozygous for a minor mutation so I would not think that she would be retaining too much iron.   I told her that her 2 daughters by would need to be checked at some point. Since they are having their monthly cycles, I would not think that they would be having issues with iron overload.  I really don't think she is a bone marrow test. I don't think we had to send off any type of genetic studies for polycythemia.  We will have to see what her ferritin and iron studies show. We can certainly get her back depending on the levels of ferritin and  her iron saturation.  I spent about 45 minutes with her.

## 2015-10-09 LAB — IRON AND TIBC
%SAT: 26 % (ref 21–57)
Iron: 91 ug/dL (ref 41–142)
TIBC: 343 ug/dL (ref 236–444)
UIBC: 252 ug/dL (ref 120–384)

## 2015-10-09 LAB — RETICULOCYTES: Reticulocyte Count: 1.3 % (ref 0.6–2.6)

## 2015-10-09 LAB — AFP TUMOR MARKER: AFP, Serum, Tumor Marker: 2.8 ng/mL (ref 0.0–8.3)

## 2015-10-09 LAB — FERRITIN: Ferritin: 88 ng/ml (ref 9–269)

## 2015-10-09 LAB — ERYTHROPOIETIN: Erythropoietin: 18.7 m[IU]/mL — ABNORMAL HIGH (ref 2.6–18.5)

## 2015-10-12 LAB — HEMOGLOBINOPATHY EVALUATION
HEMOGLOBIN A2 QUANTITATION: 2.3 % (ref 0.7–3.1)
HEMOGLOBIN F QUANTITATION: 0 % (ref 0.0–2.0)
HGB A: 97.7 % (ref 94.0–98.0)
HGB C: 0 %
HGB S: 0 %

## 2015-10-15 LAB — HEMOCHROMATOSIS DNA-PCR(C282Y,H63D)

## 2015-10-23 ENCOUNTER — Telehealth: Payer: Self-pay | Admitting: *Deleted

## 2015-10-23 ENCOUNTER — Encounter: Payer: Self-pay | Admitting: Internal Medicine

## 2015-10-23 ENCOUNTER — Ambulatory Visit (INDEPENDENT_AMBULATORY_CARE_PROVIDER_SITE_OTHER): Payer: 59 | Admitting: Internal Medicine

## 2015-10-23 VITALS — BP 148/90 | HR 60 | Ht 60.0 in | Wt 161.8 lb

## 2015-10-23 DIAGNOSIS — J45991 Cough variant asthma: Secondary | ICD-10-CM

## 2015-10-23 LAB — NITRIC OXIDE: NITRIC OXIDE: 8

## 2015-10-23 MED ORDER — BUDESONIDE-FORMOTEROL FUMARATE 80-4.5 MCG/ACT IN AERO: INHALATION_SPRAY | RESPIRATORY_TRACT | Status: DC

## 2015-10-23 NOTE — Progress Notes (Signed)
Subjective:    Patient ID: Shelby Harrington, female    DOB: 22-Aug-1950    MRN: [id number]    Brief patient profile:  59 yowf never smoker ? Born prematurely with "bronchitis at birth" and poor ex tolerance always last to finish any physical activity in groups as long as she can remember but able to do recumbent bike since summer 2016 and in meantime in Feb 2016 developed a daytime and hs cough pattern that has persisted despite symbicort 160 which did not really improve ex tol so referred to pulmonary clinic 09/15/2015 by Shelby Harrington for further pulmonary eval.     History of Present Illness  09/15/2015  1st [place] [facility] office visit/ Shelby Harrington   Chief Complaint  Patient presents with  . Pulmonary Consult    Referred by Shelby Harrington. Pt c/o SOB and cough x 1 yr. Cough has been worse for the past month. Cough is non prod and worse when she lies down and when she talks alot. She gets winded walking up and incline or doing housework such as making her bed.   doe chronic / daily   = MMRC1 = can walk nl pace, flat grade, can't hurry or go uphills or steps s sob  And so far no better on symbicort though hfa technique not ideal (see a/p) and on ppi but not sure of dose, taking pc rec Omeprazole should be 40 mg Take 30-60 min before first meal of the day and take pepcid ac 20 mg one hour before bed with zyrtec  GERD   Continue symbicort 160 Take 2 puffs first thing in am and then another 2 puffs about 12 hours later (take pm dose at least 15 min before your exercise and if not better after 2 weeks stop it Work on inhaler technique:   For cough > tessilon 200 mg up to every 8 hours as needed (won't make you sleepy)    10/23/2015  f/u ov/Shelby Harrington re:  ppi ac / pepcid 20 mg not on tessilon  Chief Complaint  Patient presents with  . Follow-up    Breathing is unchanged. She states she is coughing less. No new co's today.   still constant sense of something stuck in throat and needing to clear  it  Kouffman Reflux v Neurogenic Cough Differentiator Reflux Comments  Do you awaken from a sound sleep coughing violently?                            With trouble breathing? occ assoc with sob    Do you have choking episodes when you cannot  Get enough air, gasping for air ?              past   Do you usually cough when you lie down into  The bed, or when you just lie down to rest ?                          Yes    Do you usually cough after meals or eating?         no   Do you cough when (or after) you bend over?    no   GERD SCORE     Kouffman Reflux v Neurogenic Cough Differentiator Neurogenic   Do you more-or-less cough all day long? Not now   Does change of temperature make you cough? yes  Does laughing or chuckling cause you to cough? no   Do fumes (perfume, automobile fumes, burned  Toast, etc.,) cause you to cough ?      no   Does speaking, singing, or talking on the phone cause you to cough   ?               Yes    Neurogenic/Airway score       No obvious patterns in day to day or daytime variability or assoc excess/ purulent sputum or mucus plugs   or cp or chest tightness, subjective wheeze or overt sinus or hb symptoms. No unusual exp hx or h/o childhood pna/ asthma or knowledge of premature birth.  Sleeping ok without nocturnal  or early am exacerbation  of respiratory  c/o's or need for noct saba. Also denies any obvious fluctuation of symptoms with weather or environmental changes or other aggravating or alleviating factors except as outlined above   Current Medications, Allergies, Complete Past Medical History, Past Surgical History, Family History, and Social History were reviewed in [employer] record.  ROS  The following are not active complaints unless bolded sore throat, dysphagia, dental problems, itching, sneezing,  nasal congestion or excess/ purulent secretions, ear ache,   fever, chills, sweats, unintended wt loss, classically pleuritic  or exertional cp, hemoptysis,  orthopnea pnd or leg swelling, presyncope, palpitations, abdominal pain, anorexia, nausea, vomiting, diarrhea  or change in bowel or bladder habits, change in stools or urine, dysuria,hematuria,  rash, arthralgias, visual complaints, headache, numbness, weakness or ataxia or problems with walking or coordination,  change in mood/affect or memory.                       Objective:   Physical Exam  amb wf nad  Wt Readings from Last 3 Encounters:  10/23/15 161 lb 12.8 oz (73.392 kg)  10/08/15 161 lb (73.029 kg)  09/15/15 162 lb (73.483 kg)    Vital signs reviewed   HEENT: nl dentition, turbinates, and oropharynx. Nl external ear canals without cough reflex   NECK :  without JVD/Nodes/TM/ nl carotid upstrokes bilaterally   LUNGS: no acc muscle use,  Nl contour chest which is clear to A and P bilaterally without cough on insp or exp maneuvers   CV:  RRR  no s3 or murmur or increase in P2, no edema   ABD:  soft and nontender with nl inspiratory excursion in the supine position. No bruits or organomegaly, bowel sounds nl  MS:  Nl gait/ ext warm without deformities, calf tenderness, cyanosis or clubbing No obvious joint restrictions   SKIN: warm and dry without lesions    NEURO:  alert, approp, nl sensorium with  no motor deficits       I personally reviewed images and agree with radiology impression as follows:  CXR:  08/25/15 No acute cardiopulmonary disease.        Assessment & Plan:

## 2015-10-23 NOTE — Telephone Encounter (Signed)
Patient calling to get lab results and see when her next appointment needs to be scheduled.  Per Dr Marin Olp, her labs looked good with no areas of concern at this time. He would like patient to return in 3 months with labs. Message sent to scheduler and patient aware of plan.

## 2015-10-23 NOTE — Patient Instructions (Addendum)
Omeprazole continue  40 mg Take 30-60 min before first meal of the day and take pepcid ac 20 mg one hour before bed    Stop zyrtec and For drainage / throat tickle try take CHLORPHENIRAMINE  4 mg - take one every 4 hours as needed - available over the counter- may cause drowsiness so start with just a bedtime dose or two and see how you tolerate it before trying in daytime    Continue symbicort 80 Take 1-2 puffs first thing in am and then another 2 puffs about 12 hours later (take pm dose at least 15 min before your exercise and if not better after 2 weeks stop it  Work on inhaler technique:  relax and gently blow all the way out then take a nice smooth deep breath back in, triggering the inhaler at same time you start breathing in.  Hold for up to 5 seconds if you can. Blow out thru nose. Rinse and gargle with water when done  For cough > tessilon 200 mg up to every 6 hours as needed (won't make you sleepy and goal is to eliminate all coughing and all  throat clearing   Please schedule a follow up office visit in 4 weeks, sooner if needed

## 2015-10-24 NOTE — Assessment & Plan Note (Addendum)
10/23/2015  extensive coaching HFA effectiveness =    75% > try change symbicort to 80 2bid NO 10/23/2015  = 11 - Spirometry 10/23/2015  wnl including fef 25-75 p symbicort with reported ongoing doe - 10/23/2015  Walked RA x 3 laps @ 185 ft each stopped due to  Sob/cough s desat at very fast  pace   Symptoms are completely non-specific per cough questionaire and markedly disproportionate to objective findings and not clear this is a lung problem but pt does appear to have difficult airway management issues. DDX of  difficult airways management almost all start with A and  include Adherence, Ace Inhibitors, Acid Reflux, Active Sinus Disease, Alpha 1 Antitripsin deficiency, Anxiety masquerading as Airways dz,  ABPA,  Allergy(esp in young), Aspiration (esp in elderly), Adverse effects of meds,  Active smokers, A bunch of PE's (a small clot burden can't cause this syndrome unless there is already severe underlying pulm or vascular dz with poor reserve) plus two Bs  = Bronchiectasis and Beta blocker use..and one C= CHF  Adherence is always the initial "prime suspect" and is a multilayered concern that requires a "trust but verify" approach in every patient - starting with knowing how to use medications, especially inhalers, correctly, keeping up with refills and understanding the fundamental difference between maintenance and prns vs those medications only taken for a very short course and then stopped and not refilled.  - The proper method of use, as well as anticipated side effects, of a metered-dose inhaler are discussed and demonstrated to the patient. Improved effectiveness after extensive coaching during this visit to a level of approximately 75 % from a baseline of 50 %  - not adherent with tessilon > try again with goal of elimination of all throat clearing  ? Acid (or non-acid) GERD > always difficult to exclude as up to 75% of pts in some series report no assoc GI/ Heartburn symptoms> rec continue max  (24h)  acid suppression and diet restrictions/ reviewed     ? Allergy/asthma > very unlikely this represents refractory allergic asthma specifically or even asthma in general so try reducing symbicort to 80  1-2 bid with goal of weaning off completely if not helping  ? Anxiety > usually at the bottom of this list of usual suspects but should be much higher on this pt's based on H and P   ? BB > very unlikely just on low dose toprol  I had an extended discussion with the patient reviewing all relevant studies completed to date and  lasting 15 to 20 minutes of a 25 minute visit    Each maintenance medication was reviewed in detail including most importantly the difference between maintenance and prns and under what circumstances the prns are to be triggered using an action plan format that is not reflected in the computer generated alphabetically organized AVS.    Please see instructions for details which were reviewed in writing and the patient given a copy highlighting the part that I personally wrote and discussed at today's ov.

## 2015-10-26 ENCOUNTER — Ambulatory Visit
Admission: RE | Admit: 2015-10-26 | Discharge: 2015-10-26 | Disposition: A | Discharge status: Home / Self Care | Payer: 59 | Source: Ambulatory Visit

## 2015-10-26 DIAGNOSIS — Z1231 Encounter for screening mammogram for malignant neoplasm of breast: Secondary | ICD-10-CM

## 2015-11-24 ENCOUNTER — Ambulatory Visit (INDEPENDENT_AMBULATORY_CARE_PROVIDER_SITE_OTHER): Payer: 59 | Admitting: Internal Medicine

## 2015-11-24 ENCOUNTER — Encounter: Payer: Self-pay | Admitting: Internal Medicine

## 2015-11-24 VITALS — BP 132/82 | HR 72 | Ht 60.0 in | Wt 161.0 lb

## 2015-11-24 DIAGNOSIS — J45991 Cough variant asthma: Secondary | ICD-10-CM

## 2015-11-24 MED ORDER — GABAPENTIN 100 MG PO CAPS: 100.0000 mg | ORAL_CAPSULE | Freq: Three times a day (TID) | ORAL | Status: DC

## 2015-11-24 NOTE — Progress Notes (Signed)
Subjective:    Patient ID: Shelby Harrington, female    DOB: [birth year]    MRN: [id number]    Brief patient profile:  17 yowf never smoker ? Born prematurely with "bronchitis at birth" and poor ex tolerance always last to finish any physical activity in groups as long as she can remember but able to do recumbent bike since summer 2016 and in meantime in Feb 2016 developed a daytime and hs cough pattern that has persisted despite symbicort 160 which did not really improve ex tol so referred to pulmonary clinic 09/15/2015 by Dr Shelby Harrington for further pulmonary eval.     History of Present Illness  09/15/2015  1st [place] [facility] office visit/ Shelby Harrington   Chief Complaint  Patient presents with  . Pulmonary Consult    Referred by Dr. Harlan Harrington. Pt c/o SOB and cough x 1 yr. Cough has been worse for the past month. Cough is non prod and worse when she lies down and when she talks alot. She gets winded walking up and incline or doing housework such as making her bed.   doe chronic / daily   = MMRC1 = can walk nl pace, flat grade, can't hurry or go uphills or steps s sob  And so far no better on symbicort though hfa technique not ideal (see a/p) and on ppi but not sure of dose, taking pc rec Omeprazole should be 40 mg Take 30-60 min before first meal of the day and take pepcid ac 20 mg one hour before bed with zyrtec  GERD   Continue symbicort 160 Take 2 puffs first thing in am and then another 2 puffs about 12 hours later (take pm dose at least 15 min before your exercise and if not better after 2 weeks stop it Work on inhaler technique:   For cough > tessilon 200 mg up to every 8 hours as needed (won't make you sleepy)    10/23/2015  f/u ov/Shelby Harrington re:  ppi ac / pepcid 20 mg not on tessilon  Chief Complaint  Patient presents with  . Follow-up    Breathing is unchanged. She states she is coughing less. No new co's today.   still constant sense of something stuck in throat and needing to clear  it rec Omeprazole continue  40 mg Take 30-60 min before first meal of the day and take pepcid ac 20 mg one hour before bed   Stop zyrtec and For drainage / throat tickle try take CHLORPHENIRAMINE  4 mg - take one every 4 hours as needed - available over the counter- may cause drowsiness so start with just a bedtime dose or two and see how you tolerate it before trying in daytime   Continue symbicort 80 Take 1-2 puffs first thing in am and then another 2 puffs about 12 hours later (take pm dose at least 15 min before your exercise and if not better after 2 weeks stop it Work on inhaler technique:  relax and gently blow all the way out then take a nice smooth deep breath back in, triggering the inhaler at same time you start breathing in.  Hold for up to 5 seconds if you can. Blow out thru nose. Rinse and gargle with water when done For cough > tessilon 200 mg up to every 6 hours as needed (won't make you sleepy and goal is to eliminate all coughing and all  throat clearing    11/24/2015  f/u ov/Shelby Harrington re: cough since  feb 2016 on symb 80 2bid and max gerd rx  Chief Complaint  Patient presents with  . Follow-up    pt c/o sob w/activity, cont non prod cough, occ chest heaviness.   did not try the h1 daytime at all  But overall better    Kouffman Reflux v Neurogenic Cough Differentiator Reflux Comments  Do you awaken from a sound sleep coughing violently?                            With trouble breathing? gone    Do you have choking episodes when you cannot  Get enough air, gasping for air ?              gone   Do you usually cough when you lie down into  The bed, or when you just lie down to rest ?                          Better ,min   Do you usually cough after meals or eating?         no   Do you cough when (or after) you bend over?    no   GERD SCORE     Kouffman Reflux v Neurogenic Cough Differentiator Neurogenic   Do you more-or-less cough all day long? No   Does change of temperature  make you cough? Some still   Does laughing or chuckling cause you to cough? occ   Do fumes (perfume, automobile fumes, burned  Toast, etc.,) cause you to cough ?      no   Does speaking, singing, or talking on the phone cause you to cough   ?               Yes talking    Neurogenic/Airway score       No obvious patterns in day to day or daytime variability or assoc excess/ purulent sputum or mucus plugs   or cp or chest tightness, subjective wheeze or overt sinus or hb symptoms. No unusual exp hx or h/o childhood pna/ asthma or knowledge of premature birth.  Sleeping ok without nocturnal  or early am exacerbation  of respiratory  c/o's or need for noct saba. Also denies any obvious fluctuation of symptoms with weather or environmental changes or other aggravating or alleviating factors except as outlined above   Current Medications, Allergies, Complete Past Medical History, Past Surgical History, Family History, and Social History were reviewed in [employer] record.  ROS  The following are not active complaints unless bolded sore throat, dysphagia, dental problems, itching, sneezing,  nasal congestion or excess/ purulent secretions, ear ache,   fever, chills, sweats, unintended wt loss, classically pleuritic or exertional cp, hemoptysis,  orthopnea pnd or leg swelling, presyncope, palpitations, abdominal pain, anorexia, nausea, vomiting, diarrhea  or change in bowel or bladder habits, change in stools or urine, dysuria,hematuria,  rash, arthralgias, visual complaints, headache, numbness, weakness or ataxia or problems with walking or coordination,  change in mood/affect or memory.                    Objective:   Physical Exam  amb wf nad still occ throat clearing   11/24/2015        161  10/23/15 161 lb 12.8 oz (73.392 kg)  10/08/15 161 lb (73.029 kg)  09/15/15 162 lb (73.483 kg)  Vital signs reviewed   HEENT: nl dentition, turbinates, and oropharynx. Nl  external ear canals without cough reflex   NECK :  without JVD/Nodes/TM/ nl carotid upstrokes bilaterally   LUNGS: no acc muscle use,  Nl contour chest which is clear to A and P bilaterally without cough on insp or exp maneuvers   CV:  RRR  no s3 or murmur or increase in P2, no edema   ABD:  soft and nontender with nl inspiratory excursion in the supine position. No bruits or organomegaly, bowel sounds nl  MS:  Nl gait/ ext warm without deformities, calf tenderness, cyanosis or clubbing No obvious joint restrictions   SKIN: warm and dry without lesions    NEURO:  alert, approp, nl sensorium with  no motor deficits       I personally reviewed images and agree with radiology impression as follows:  CXR:  08/25/15 No acute cardiopulmonary disease.        Assessment & Plan:   Outpatient Encounter Prescriptions as of 11/24/2015  Medication Sig  . aspirin 81 MG tablet Take 81 mg by mouth daily.  . benzonatate (TESSALON) 200 MG capsule Take 1 capsule (200 mg total) by mouth 3 (three) times daily as needed for cough.  Marland Kitchen CALCIUM PO Take 1 capsule by mouth daily.  . Cholecalciferol (SM VITAMIN D3) 4000 units CAPS Take 1 capsule by mouth daily.  . cyclobenzaprine (FLEXERIL) 10 MG tablet Take 10 mg by mouth at bedtime as needed for muscle spasms.   . famotidine (PEPCID) 20 MG tablet Take 20 mg by mouth at bedtime.  . fluticasone (FLONASE) 50 MCG/ACT nasal spray Place 2 sprays into both nostrils daily.  . Glucosamine-Chondroitin (GLUCOSAMINE CHONDR COMPLEX PO) Take 1 capsule by mouth daily.  . metoprolol succinate (TOPROL-XL) 25 MG 24 hr tablet Take 25 mg by mouth daily.  . Multiple Vitamins-Minerals (CENTRUM SILVER PO) Take 1 tablet by mouth daily.  Marland Kitchen omeprazole (PRILOSEC) 40 MG capsule Take 40 mg by mouth daily before breakfast.  . [DISCONTINUED] budesonide-formoterol (SYMBICORT) 80-4.5 MCG/ACT inhaler Take 2 puffs first thing in am and then another 2 puffs about 12 hours later.  .  gabapentin (NEURONTIN) 100 MG capsule Take 1 capsule (100 mg total) by mouth 3 (three) times daily. One three times daily  . [DISCONTINUED] cetirizine (ZYRTEC) 10 MG tablet Take 10 mg by mouth daily. Reported on 11/24/2015   No facility-administered encounter medications on file as of 11/24/2015.

## 2015-11-24 NOTE — Patient Instructions (Addendum)
Try off symbicort and on the chlorpheniramine during the day and if still coughing after memorial day the next step is:   Add gabapentin 100 mg three times daily x 2 weeks then call if not better at that point - if better stay on it for 6 weeks and return her to simplify your treatment plan

## 2015-11-26 ENCOUNTER — Encounter: Payer: Self-pay | Admitting: Internal Medicine

## 2015-11-26 NOTE — Assessment & Plan Note (Signed)
10/23/2015  extensive coaching HFA effectiveness =    75% > try change symbicort to 80 2bid NO 10/23/2015  = 11 - Spirometry 10/23/2015  wnl including fef 25-75 p symbicort with reported ongoing doe - 10/23/2015  Walked RA x 3 laps @ 185 ft each stopped due to  Sob/cough s desat at very fast  pace  - 11/24/2015 trial off symbicort due to uacs features   Not really clear the symbicort helped here and the cough has much more prominent upper airway features at present which favors uacs over asthma   Upper airway cough syndrome, so named because it's frequently impossible to sort out how much is  CR/sinusitis with freq throat clearing (which can be related to primary GERD)   vs  causing  secondary (" extra esophageal")  GERD from wide swings in gastric pressure that occur with throat clearing, often  promoting self use of mint and menthol lozenges that reduce the lower esophageal sphincter tone and exacerbate the problem further in a cyclical fashion.   These are the same pts (now being labeled as having "irritable larynx syndrome" by some cough centers) who not infrequently have a history of having failed to tolerate ace inhibitors,  dry powder inhalers or biphosphonates or report having atypical reflux symptoms that don't respond to standard doses of PPI , and are easily confused as having aecopd or asthma flares by even experienced allergists/ pulmonologists.   rec try off symb/ on 1st gen H1 per guidelines and then trial of gabapentin with methacholine challenge an option is flares off symbicort  I had an extended discussion with the patient reviewing all relevant studies completed to date and  lasting 15 to 20 minutes of a 25 minute visit    Each maintenance medication was reviewed in detail including most importantly the difference between maintenance and prns and under what circumstances the prns are to be triggered using an action plan format that is not reflected in the computer generated  alphabetically organized AVS.    Please see instructions for details which were reviewed in writing and the patient given a copy highlighting the part that I personally wrote and discussed at today's ov.

## 2015-12-03 ENCOUNTER — Telehealth: Payer: Self-pay | Admitting: Internal Medicine

## 2015-12-03 NOTE — Telephone Encounter (Signed)
Try off symbicort and on the chlorpheniramine during the day and if still coughing after memorial day the next step is:   Add gabapentin 100 mg three times daily x 2 weeks then call if not better at that point - if better stay on it for 6 weeks and return her to simplify your treatment plan   I spoke with the pt  She is still c/o cough despite taking chlortabs  She states "it's not bad but still there" She has not started the gabapentin yet, so I advised her again of the above instructions and that med is already at [facility] waiting on her  She verbalized understanding  Will give this 2 wks and call if not improving

## 2015-12-07 ENCOUNTER — Telehealth: Payer: Self-pay | Admitting: Internal Medicine

## 2015-12-07 NOTE — Telephone Encounter (Signed)
Spoke with pt. She had a question about whether she should continue Chlorpheniramine. Advised her that she could use this medication as needed. She agreed and verbalized understanding. Nothing further was needed.

## 2015-12-23 ENCOUNTER — Telehealth: Payer: Self-pay | Admitting: Internal Medicine

## 2015-12-23 DIAGNOSIS — J45991 Cough variant asthma: Secondary | ICD-10-CM

## 2015-12-23 NOTE — Telephone Encounter (Signed)
Patient says that she takes the Gabapentin, it stops the cough for about 6 hours, then the cough comes right back after 6 hours.   Patient Instructions     Try off symbicort and on the chlorpheniramine during the day and if still coughing after memorial day the next step is:   Add gabapentin 100 mg three times daily x 2 weeks then call if not better at that point - if better stay on it for 6 weeks and return her to simplify your treatment plan

## 2015-12-24 MED ORDER — GABAPENTIN 100 MG PO CAPS: 100.0000 mg | ORAL_CAPSULE | Freq: Four times a day (QID) | ORAL | Status: DC

## 2015-12-24 NOTE — Telephone Encounter (Signed)
rec qid dosing and f/u ov to titrate further/ regroup - may need extra pills to get her through to [facility]

## 2015-12-24 NOTE — Telephone Encounter (Signed)
Pt returning michelle's call. [phone] ext [phone]

## 2015-12-24 NOTE — Telephone Encounter (Signed)
Left message for patient to call back  

## 2015-12-24 NOTE — Telephone Encounter (Signed)
This is actually a "starter dose" (ie the lowest dose) and as long as doing well on it in these low doses would continue until next ov, but I'm happy to see her sooner if she has additional concerns

## 2015-12-24 NOTE — Telephone Encounter (Signed)
Patient notified of Dr. Gustavus Bryant recommendations.  First available appointment is not until July 25th.  Patient wants to know if July would be too long for her to be taking a higher dose of this medication.  She wants to know if Dr. Melvyn Novas needs to see her sooner.  If so, Dr. Melvyn Novas, where can we add to your schedule?  Patient prefers earliest morning or latest in the evening.   Dr. Melvyn Novas, please advise.

## 2015-12-24 NOTE — Telephone Encounter (Signed)
Patient notified of Dr. Gustavus Bryant recommendations. Patient scheduled to see Dr. Melvyn Novas on 01/27/16 at 430pm. Patient states she will call back if she has trouble and will be seen sooner. Nothing further needed.

## 2016-01-22 ENCOUNTER — Other Ambulatory Visit: Payer: Self-pay

## 2016-01-25 ENCOUNTER — Encounter: Payer: Self-pay | Admitting: Hematology & Oncology

## 2016-01-25 ENCOUNTER — Ambulatory Visit (HOSPITAL_BASED_OUTPATIENT_CLINIC_OR_DEPARTMENT_OTHER): Payer: 59 | Admitting: Hematology & Oncology

## 2016-01-25 ENCOUNTER — Other Ambulatory Visit (HOSPITAL_BASED_OUTPATIENT_CLINIC_OR_DEPARTMENT_OTHER): Payer: 59

## 2016-01-25 HISTORY — DX: Hereditary hemochromatosis: E83.110

## 2016-01-25 LAB — CMP (CANCER CENTER ONLY)
ALBUMIN: 3.9 g/dL (ref 3.3–5.5)
ALK PHOS: 89 U/L — AB (ref 26–84)
ALT: 32 U/L (ref 10–47)
AST: 30 U/L (ref 11–38)
BILIRUBIN TOTAL: 0.8 mg/dL (ref 0.20–1.60)
BUN, Bld: 17 mg/dL (ref 7–22)
CALCIUM: 9.4 mg/dL (ref 8.0–10.3)
CO2: 31 meq/L (ref 18–33)
CREATININE: 1.1 mg/dL (ref 0.6–1.2)
Chloride: 105 mEq/L (ref 98–108)
Glucose, Bld: 92 mg/dL (ref 73–118)
Potassium: 5.2 mEq/L — ABNORMAL HIGH (ref 3.3–4.7)
SODIUM: 140 meq/L (ref 128–145)
TOTAL PROTEIN: 6.8 g/dL (ref 6.4–8.1)

## 2016-01-25 LAB — CBC WITH DIFFERENTIAL (CANCER CENTER ONLY)
BASO#: 0.1 10*3/uL (ref 0.0–0.2)
BASO%: 0.7 % (ref 0.0–2.0)
EOS%: 4.6 % (ref 0.0–7.0)
Eosinophils Absolute: 0.3 10*3/uL (ref 0.0–0.5)
HEMATOCRIT: 48.1 % — AB (ref 34.8–46.6)
HEMOGLOBIN: 16.6 g/dL — AB (ref 11.6–15.9)
LYMPH#: 2.8 10*3/uL (ref 0.9–3.3)
LYMPH%: 38.7 % (ref 14.0–48.0)
MCH: 31.9 pg (ref 26.0–34.0)
MCHC: 34.5 g/dL (ref 32.0–36.0)
MCV: 93 fL (ref 81–101)
MONO#: 0.8 10*3/uL (ref 0.1–0.9)
MONO%: 11.3 % (ref 0.0–13.0)
NEUT%: 44.7 % (ref 39.6–80.0)
NEUTROS ABS: 3.2 10*3/uL (ref 1.5–6.5)
Platelets: 190 10*3/uL (ref 145–400)
RBC: 5.2 10*6/uL (ref 3.70–5.32)
RDW: 12.4 % (ref 11.1–15.7)
WBC: 7.1 10*3/uL (ref 3.9–10.0)

## 2016-01-25 NOTE — Progress Notes (Signed)
Hematology and Oncology Follow Up Visit  MALIKIA BERGS [id number] 1951/07/01 65 y.o. 01/25/2016   Principle Diagnosis:   Hemochromatosis - Heterozygous H63D mutation   Current Therapy:    Phlebotomy to keep ferritin < 100     Interim History:  Ms. Rodd is back for follow-up. We first saw her back in April. At that point time, she had a hemochromatosis genetic assay. She was only heterozygous for a minor mutation-H63D.  When we saw her, her ferritin was 88 with iron saturation of 26%.   She has had no problems. She has some swelling about her ankles. This is localized swelling that is not red or warm.  There's been no nausea or vomiting. She is still working.  She and her [place] go [place] the mountains. They enjoy this.  She told me that a nurse that comes out to the company twice a month to draw blood work. This will save her a lot of money from him to come here area and I think it would be worthwhile to get the nurse to draw blood. I gave Ms. Blanchette a prescription for what we wanted.  She's had no cough or shortness of breath. His been no rashes.  Now, her performance status is ECOG 0.  Medications:  Current Outpatient Prescriptions:  .  aspirin 81 MG tablet, Take 81 mg by mouth daily., Disp: , Rfl:  .  CALCIUM PO, Take 1 capsule by mouth daily., Disp: , Rfl:  .  Cholecalciferol (SM VITAMIN D3) 4000 units CAPS, Take 1 capsule by mouth daily., Disp: , Rfl:  .  cyclobenzaprine (FLEXERIL) 10 MG tablet, Take 10 mg by mouth at bedtime as needed for muscle spasms. , Disp: , Rfl:  .  famotidine (PEPCID) 20 MG tablet, Take 20 mg by mouth at bedtime., Disp: , Rfl:  .  gabapentin (NEURONTIN) 100 MG capsule, Take 1 capsule (100 mg total) by mouth 4 (four) times daily., Disp: 120 capsule, Rfl: 0 .  Glucosamine-Chondroitin (GLUCOSAMINE CHONDR COMPLEX PO), Take 1 capsule by mouth daily., Disp: , Rfl:  .  metoprolol succinate (TOPROL-XL) 50 MG 24 hr tablet, , Disp: , Rfl:  .   Multiple Vitamins-Minerals (CENTRUM SILVER PO), Take 1 tablet by mouth daily., Disp: , Rfl:  .  omeprazole (PRILOSEC) 40 MG capsule, Take 40 mg by mouth daily before breakfast., Disp: , Rfl:   Allergies:  Allergies  Allergen Reactions  . Codeine   . Latex   . Penicillins   . Prednisone Palpitations    Past Medical History, Surgical history, Social history, and Family History were reviewed and updated.  Review of Systems: As above  Physical Exam:  height is 5' (1.524 m) and weight is 162 lb (73.5 kg). Her oral temperature is 97.9 F (36.6 C). Her blood pressure is 168/89 (abnormal) and her pulse is 53 (abnormal). Her respiration is 18.   Wt Readings from Last 3 Encounters:  01/25/16 162 lb (73.5 kg)  11/24/15 161 lb (73 kg)  10/23/15 161 lb 12.8 oz (73.4 kg)     Head and neck exam shows no ocular or oral lesions. There are no palpable cervical or supraclavicular lymph nodes. Lungs are clear. Cardiac exam regular rate and rhythm with no murmurs, rubs or bruits. Abdomen is soft. She has good bowel sounds. There is no fluid wave. There is no palpable liver or spleen tip. Back exam shows no tenderness over the spine, ribs or hips. Extremities shows no clubbing, cyanosis or edema.  Neurological exam shows no focal neurological deficits. Skin exam shows no rashes, ecchymoses or petechia.  Lab Results  Component Value Date   WBC 7.1 01/25/2016   HGB 16.6 (H) 01/25/2016   HCT 48.1 (H) 01/25/2016   MCV 93 01/25/2016   PLT 190 01/25/2016     Chemistry      Component Value Date/Time   NA 140 01/25/2016 1146   NA 143 10/08/2015 1316   K 5.2 (H) 01/25/2016 1146   K 4.1 10/08/2015 1316   CL 105 01/25/2016 1146   CO2 31 01/25/2016 1146   CO2 27 10/08/2015 1316   BUN 17 01/25/2016 1146   BUN 17.4 10/08/2015 1316   CREATININE 1.1 01/25/2016 1146   CREATININE 1.1 10/08/2015 1316      Component Value Date/Time   CALCIUM 9.4 01/25/2016 1146   CALCIUM 9.4 10/08/2015 1316   ALKPHOS 89  (H) 01/25/2016 1146   ALKPHOS 96 10/08/2015 1316   AST 30 01/25/2016 1146   AST 28 10/08/2015 1316   ALT 32 01/25/2016 1146   ALT 29 10/08/2015 1316   BILITOT 0.80 01/25/2016 1146   BILITOT 0.30 10/08/2015 1316         Impression and Plan: Ms. Monegro is A 65 year old white female who is heterozygous for a minor hemochromatosis gene. We will see I studies show.  For now, we will have her labs drawn by the office nurse. This will make it a lot easier for Miss Ludd. I just want to make life easier for her.  We will call her with the iron studies.  Given that she is heterozygous for a minor mutation, I really don't think that she should have a lot of problems.  We can see her as needed. We will call her. The labs will be drawn every 3 months.   [name], MD 7/24/201712:31 PM

## 2016-01-26 LAB — IRON AND TIBC
%SAT: 35 % (ref 21–57)
Iron: 119 ug/dL (ref 41–142)
TIBC: 338 ug/dL (ref 236–444)
UIBC: 219 ug/dL (ref 120–384)

## 2016-01-26 LAB — RETICULOCYTES: RETICULOCYTE COUNT: 1.2 % (ref 0.6–2.6)

## 2016-01-26 LAB — FERRITIN: Ferritin: 88 ng/ml (ref 9–269)

## 2016-01-27 ENCOUNTER — Encounter: Payer: Self-pay | Admitting: Internal Medicine

## 2016-01-27 ENCOUNTER — Ambulatory Visit (INDEPENDENT_AMBULATORY_CARE_PROVIDER_SITE_OTHER): Payer: 59 | Admitting: Internal Medicine

## 2016-01-27 VITALS — BP 140/84 | HR 60 | Ht 59.75 in | Wt 164.0 lb

## 2016-01-27 DIAGNOSIS — J45991 Cough variant asthma: Secondary | ICD-10-CM | POA: Diagnosis not present

## 2016-01-27 MED ORDER — GABAPENTIN 100 MG PO CAPS: 100.0000 mg | ORAL_CAPSULE | Freq: Four times a day (QID) | ORAL | 2 refills | Status: DC

## 2016-01-27 NOTE — Patient Instructions (Signed)
Automatic = pepcid 20 mg at bedtime and gabapentin is 100 mg four times daily   For drainage / throat tickle try take CHLORPHENIRAMINE  4 mg - take one every 4 hours as needed - available over the counter- may cause drowsiness so start with just a bedtime dose or two and see how you tolerate it before trying in daytime    Please schedule a follow up office visit in 6 weeks, call sooner if needed

## 2016-01-27 NOTE — Progress Notes (Signed)
Subjective:    Patient ID: Shelby Harrington, female    DOB: 25-Apr-1951    MRN: [id number]    Brief patient profile:  67 yowf never smoker ? 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     History of Present Illness  09/15/2015  1st [facility] office visit/ [name]   Chief Complaint  Patient presents with  . Pulmonary Consult    Referred by Dr. [name]. Pt c/o SOB and cough x 1 yr. Cough has been worse for the past month. Cough is non prod and worse when she lies down and when she talks alot. She gets winded walking up and incline or doing housework such as making her bed.   doe chronic / daily   = MMRC1 = can walk nl pace, flat grade, can't hurry or go uphills or steps s sob  And so far no better on symbicort though hfa technique not ideal (see a/p) and on ppi but not sure of dose, taking pc rec Omeprazole should be 40 mg Take 30-60 min before first meal of the day and take pepcid ac 20 mg one hour before bed with zyrtec  GERD   Continue symbicort 160 Take 2 puffs first thing in am and then another 2 puffs about 12 hours later (take pm dose at least 15 min before your exercise and if not better after 2 weeks stop it Work on inhaler technique:   For cough > tessilon 200 mg up to every 8 hours as needed (won't make you sleepy)    10/23/2015  f/u ov/[name] re:  ppi ac / pepcid 20 mg not on tessilon  Chief Complaint  Patient presents with  . Follow-up    Breathing is unchanged. She states she is coughing less. No new co's today.   still constant sense of something stuck in throat and needing to clear  it rec Omeprazole continue  40 mg Take 30-60 min before first meal of the day and take pepcid ac 20 mg one hour before bed   Stop zyrtec and For drainage / throat tickle try take CHLORPHENIRAMINE  4 mg - take one every 4 hours as needed - available over the counter- may cause drowsiness so start with just a bedtime dose or two and see how you tolerate it before trying in daytime   Continue symbicort 80 Take 1-2 puffs first thing in am and then another 2 puffs about 12 hours later (take pm dose at least 15 min before your exercise and if not better after 2 weeks stop it Work on inhaler technique:  relax and gently blow all the way out then take a nice smooth deep breath back in, triggering the inhaler at same time you start breathing in.  Hold for up to 5 seconds if you can. Blow out thru nose. Rinse and gargle with water when done For cough > tessilon 200 mg up to every 6 hours as needed (won't make you sleepy and goal is to eliminate all coughing and all  throat clearing    11/24/2015  f/u ov/[name] re: cough since  feb 2016 on symb 80 2bid and max gerd rx  Chief Complaint  Patient presents with  . Follow-up    pt c/o sob w/activity, cont non prod cough, occ chest heaviness.  did not try the h1 daytime at all  But overall better    Kouffman Reflux v Neurogenic Cough Differentiator Reflux Comments  Do you awaken from a sound sleep coughing violently?                            With trouble breathing? gone    Do you have choking episodes when you cannot  Get enough air, gasping for air ?              gone   Do you usually cough when you lie down into  The bed, or when you just lie down to rest ?                          Better ,min   Do you usually cough after meals or eating?         no   Do you cough when (or after) you bend over?    no   GERD SCORE     Kouffman Reflux v Neurogenic Cough Differentiator Neurogenic   Do you more-or-less cough all day long? No   Does change of temperature  make you cough? Some still   Does laughing or chuckling cause you to cough? occ   Do fumes (perfume, automobile fumes, burned  Toast, etc.,) cause you to cough ?      no   Does speaking, singing, or talking on the phone cause you to cough   ?               Yes talking    Neurogenic/Airway score     rec Try off symbicort and on the chlorpheniramine during the day and if still coughing after memorial day the next step is: Add gabapentin 100 mg three times daily x 2 weeks then call if not better at that point - if better stay on it for 6 weeks and return her to simplify your treatment plan    01/27/2016  f/u ov/[name] re:  Chief Complaint  Patient presents with  . Follow-up    Cough has improved some, but not completely resolved. She also c/o PND.    on avg using h1 bid at most  Gabapentin 100 mg tid  Min noct cough  Breathing better off inhalers and Not limited by breathing from desired activities    No obvious day to day or daytime variability or assoc excess/ purulent sputum or mucus plugs or hemoptysis or cp or chest tightness, subjective wheeze or overt sinus or hb symptoms. No unusual exp hx or h/o childhood pna/ asthma or knowledge of premature birth.  Sleeping ok without nocturnal  or early am exacerbation  of respiratory  c/o's or need for noct saba. Also denies any obvious fluctuation of symptoms with weather or environmental changes or other aggravating or alleviating factors except as outlined above   Current Medications, Allergies, Complete Past Medical History, Past Surgical History, Family History, and Social History were reviewed in [employer] record.  ROS  The following are not active complaints unless bolded sore throat, dysphagia, dental problems, itching, sneezing,  nasal congestion or excess/ purulent secretions, ear ache,   fever, chills, sweats, unintended wt loss,  classically pleuritic or exertional cp,  orthopnea pnd or leg swelling, presyncope,  palpitations, abdominal pain, anorexia, nausea, vomiting, diarrhea  or change in bowel or bladder habits, change in stools or urine, dysuria,hematuria,  rash, arthralgias, visual complaints, headache, numbness, weakness or ataxia or problems with walking or coordination,  change in mood/affect or memory.            Objective:   Physical Exam  amb wf nad     01/27/2016  11/24/2015        161  10/23/15 161 lb 12.8 oz (73.392 kg)  10/08/15 161 lb (73.029 kg)  09/15/15 162 lb (73.483 kg)    Vital signs reviewed   HEENT: nl dentition, turbinates, and oropharynx. Nl external ear canals without cough reflex   NECK :  without JVD/Nodes/TM/ nl carotid upstrokes bilaterally   LUNGS: no acc muscle use,  Nl contour chest which is clear to A and P bilaterally without cough on insp or exp maneuvers   CV:  RRR  no s3 or murmur or increase in P2, no edema   ABD:  soft and nontender with nl inspiratory excursion in the supine position. No bruits or organomegaly, bowel sounds nl  MS:  Nl gait/ ext warm without deformities, calf tenderness, cyanosis or clubbing No obvious joint restrictions   SKIN: warm and dry without lesions    NEURO:  alert, approp, nl sensorium with  no motor deficits       I personally reviewed images and agree with radiology impression as follows:  CXR:  08/25/15 No acute cardiopulmonary disease.        Assessment & Plan:

## 2016-01-28 ENCOUNTER — Telehealth: Payer: Self-pay

## 2016-01-28 NOTE — Telephone Encounter (Signed)
-----   Message from [name], MD sent at 01/26/2016  1:37 PM EDT ----- Call - the iron level is still ok!!  NO need for a phlebotomy.  Shelby Harrington

## 2016-01-28 NOTE — Telephone Encounter (Signed)
VM states pt is not accepting messages. Will attempt to contact pt at a later time. dph

## 2016-02-02 ENCOUNTER — Encounter: Payer: Self-pay | Admitting: Internal Medicine

## 2016-02-02 NOTE — Assessment & Plan Note (Signed)
10/23/2015  extensive coaching HFA effectiveness =    75% > try change symbicort to 80 2bid NO 10/23/2015  = 11 - Spirometry 10/23/2015  wnl including fef 25-75 p symbicort with reported ongoing doe - 10/23/2015  Walked RA x 3 laps @ 185 ft each stopped due to  Sob/cough s desat at very fast  pace  - 11/24/2015 trial off symbicort due to uacs features  - 12/24/2015 called to report gabapentin 100 helps but only for 6 hours > rec qid dosing and f/u ov to titrate further> did not understand qid at ov 01/27/2016 > rechallnenge with qid and increase h1 as tol/needed    I had an extended discussion with the patient reviewing all relevant studies completed to date and  lasting 15 to 20 minutes of a 25 minute visit    Clearly improving off inhalers so this strongly favors uacs/ irritable larynx so key is elimination of cyclical cough as the first step and continue gerd rx until the cough is completely gone   Each maintenance medication was reviewed in detail including most importantly the difference between maintenance and prns and under what circumstances the prns are to be triggered using an action plan format that is not reflected in the computer generated alphabetically organized AVS.    Please see instructions for details which were reviewed in writing and the patient given a copy highlighting the part that I personally wrote and discussed at today's ov.

## 2016-03-11 ENCOUNTER — Encounter: Payer: Self-pay | Admitting: Genetic Counselor

## 2016-03-14 ENCOUNTER — Encounter: Payer: Self-pay | Admitting: Internal Medicine

## 2016-03-14 ENCOUNTER — Other Ambulatory Visit (INDEPENDENT_AMBULATORY_CARE_PROVIDER_SITE_OTHER): Payer: 59

## 2016-03-14 ENCOUNTER — Ambulatory Visit (INDEPENDENT_AMBULATORY_CARE_PROVIDER_SITE_OTHER): Payer: 59 | Admitting: Internal Medicine

## 2016-03-14 VITALS — BP 138/90 | HR 58 | Ht 59.75 in | Wt 160.8 lb

## 2016-03-14 DIAGNOSIS — J45991 Cough variant asthma: Secondary | ICD-10-CM

## 2016-03-14 LAB — CBC WITH DIFFERENTIAL/PLATELET
BASOS PCT: 0.4 % (ref 0.0–3.0)
Basophils Absolute: 0 10*3/uL (ref 0.0–0.1)
EOS ABS: 0.2 10*3/uL (ref 0.0–0.7)
EOS PCT: 3.1 % (ref 0.0–5.0)
HEMATOCRIT: 50 % — AB (ref 36.0–46.0)
HEMOGLOBIN: 17.1 g/dL — AB (ref 12.0–15.0)
LYMPHS PCT: 36.8 % (ref 12.0–46.0)
Lymphs Abs: 2.9 10*3/uL (ref 0.7–4.0)
MCHC: 34.2 g/dL (ref 30.0–36.0)
MCV: 91.6 fl (ref 78.0–100.0)
MONO ABS: 0.6 10*3/uL (ref 0.1–1.0)
Monocytes Relative: 8.2 % (ref 3.0–12.0)
NEUTROS ABS: 4.1 10*3/uL (ref 1.4–7.7)
Neutrophils Relative %: 51.5 % (ref 43.0–77.0)
PLATELETS: 191 10*3/uL (ref 150.0–400.0)
RBC: 5.46 Mil/uL — ABNORMAL HIGH (ref 3.87–5.11)
RDW: 13.1 % (ref 11.5–15.5)
WBC: 7.9 10*3/uL (ref 4.0–10.5)

## 2016-03-14 MED ORDER — MONTELUKAST SODIUM 10 MG PO TABS: 10.0000 mg | ORAL_TABLET | Freq: Every day | ORAL | 11 refills | Status: DC

## 2016-03-14 NOTE — Assessment & Plan Note (Signed)
10/23/2015  extensive coaching HFA effectiveness =    75% > try change symbicort to 80 2bid FENO 10/23/2015  = 11 - Spirometry 10/23/2015  wnl including fef 25-75 p symbicort with reported ongoing doe - 10/23/2015  Walked RA x 3 laps @ 185 ft each stopped due to  Sob/cough s desat at very fast  pace  - 11/24/2015 trial off symbicort due to uacs features  - 12/24/2015 called to report gabapentin 100 helps but only for 6 hours > rec qid dosing and f/u ov to titrate further> did not understand qid at ov 01/27/2016 > rechallnenge with qid and increase h1 as tol/needed  - Allergy profile 03/14/2016 >  Eos 0. /  IgE   - singulair trial 03/14/2016 >>>   I really see no evidence of excess pnds and not keeping her up at night but still sees benefit to prn h1 so reasonable to check allergy profile and try singulair x 6 weeks then d/c if not eliminating symptoms or need for h1  In meantime, no change in neurontin or hs h2 needed   I had an extended discussion with the patient reviewing all relevant studies completed to date and  lasting 15 to 20 minutes of a 25 minute visit    Each maintenance medication was reviewed in detail including most importantly the difference between maintenance and prns and under what circumstances the prns are to be triggered using an action plan format that is not reflected in the computer generated alphabetically organized AVS.    Please see instructions for details which were reviewed in writing and the patient given a copy highlighting the part that I personally wrote and discussed at today's ov.

## 2016-03-14 NOTE — Patient Instructions (Addendum)
.  Please remember to go to the lab  department downstairs for your tests - we will call you with the results when they are available.  Add singulair 10 mg each pm to present medications  For drainage / throat tickle try take CHLORPHENIRAMINE  4 mg - take one every 4 hours as needed -     Please schedule a follow up office visit in 6 weeks, call sooner if needed

## 2016-03-14 NOTE — Progress Notes (Signed)
Subjective:    Patient ID: Shelby Harrington, female    DOB: 08/03/1950    MRN: [id number]    Brief patient profile:  66 yowf never smoker ? Born prematurely with "bronchitis at birth" and poor ex tolerance always last to finish any physical activity in groups as long as she can remember but able to do recumbent bike since summer 2016 and in meantime in Feb 2016 developed a daytime and hs cough pattern that  persisted despite symbicort 160 which did not really improve ex tol so referred to pulmonary clinic 09/15/2015 by Dr Dema Severin for further pulmonary eval.     History of Present Illness  09/15/2015  1st [place] [facility] office visit/ Shelby Harrington   Chief Complaint  Patient presents with  . Pulmonary Consult    Referred by Dr. [name]. Pt c/o SOB and cough x 1 yr. Cough has been worse for the past month. Cough is non prod and worse when she lies down and when she talks alot. She gets winded walking up and incline or doing housework such as making her bed.   doe chronic / daily   = MMRC1 = can walk nl pace, flat grade, can't hurry or go uphills or steps s sob  And so far no better on symbicort though hfa technique not ideal (see a/p) and on ppi but not sure of dose, taking pc rec Omeprazole should be 40 mg Take 30-60 min before first meal of the day and take pepcid ac 20 mg one hour before bed with zyrtec  GERD   Continue symbicort 160 Take 2 puffs first thing in am and then another 2 puffs about 12 hours later (take pm dose at least 15 min before your exercise and if not better after 2 weeks stop it Work on inhaler technique:   For cough > tessilon 200 mg up to every 8 hours as needed (won't make you sleepy)    10/23/2015  f/u ov/Shelby Harrington re: cough since Feb 2016 on  ppi ac / pepcid 20 mg not on tessilon  Chief Complaint  Patient presents with  . Follow-up    Breathing is unchanged. She states she is coughing less. No new co's today.   still constant sense of something stuck in throat and  needing to clear it rec Omeprazole continue  40 mg Take 30-60 min before first meal of the day and take pepcid ac 20 mg one hour before bed   Stop zyrtec and For drainage / throat tickle try take CHLORPHENIRAMINE  4 mg - take one every 4 hours as needed - available over the counter- may cause drowsiness so start with just a bedtime dose or two and see how you tolerate it before trying in daytime   Continue symbicort 80 Take 1-2 puffs first thing in am and then another 2 puffs about 12 hours later (take pm dose at least 15 min before your exercise and if not better after 2 weeks stop it Work on inhaler technique:  relax and gently blow all the way out then take a nice smooth deep breath back in, triggering the inhaler at same time you start breathing in.  Hold for up to 5 seconds if you can. Blow out thru nose. Rinse and gargle with water when done For cough > tessilon 200 mg up to every 6 hours as needed (won't make you sleepy and goal is to eliminate all coughing and all  throat clearing    11/24/2015  f/u ov/Shelby Harrington re: cough since feb 2016 on symb 80 2bid and max gerd rx  Chief Complaint  Patient presents with  . Follow-up    pt c/o sob w/activity, cont non prod cough, occ chest heaviness.  did not try the h1 daytime at all  But overall better    Kouffman Reflux v Neurogenic Cough Differentiator Reflux Comments  Do you awaken from a sound sleep coughing violently?                            With trouble breathing? gone    Do you have choking episodes when you cannot  Get enough air, gasping for air ?              gone   Do you usually cough when you lie down into  The bed, or when you just lie down to rest ?                          Better ,min   Do you usually cough after meals or eating?         no   Do you cough when (or after) you bend over?    no   GERD SCORE     Kouffman Reflux v Neurogenic Cough Differentiator Neurogenic   Do you more-or-less cough all day long? No   Does change  of temperature make you cough? Some still   Does laughing or chuckling cause you to cough? occ   Do fumes (perfume, automobile fumes, burned  Toast, etc.,) cause you to cough ?      no   Does speaking, singing, or talking on the phone cause you to cough   ?               Yes talking    Neurogenic/Airway score     rec Try off symbicort and on the chlorpheniramine during the day and if still coughing after memorial day the next step is: Add gabapentin 100 mg three times daily x 2 weeks then call if not better at that point - if better stay on it for 6 weeks and return her to simplify your treatment plan    01/27/2016  f/u ov/Shelby Harrington re: cough since Feb 2016  Chief Complaint  Patient presents with  . Follow-up    Cough has improved some, but not completely resolved. She also c/o PND.    on avg using h1 bid at most  Gabapentin 100 mg tid  Min noct cough  Breathing better off inhalers and Not limited by breathing from desired activities   rec Automatic = pepcid 20 mg at bedtime and gabapentin is 100 mg four times daily  For drainage / throat tickle try take CHLORPHENIRAMINE  4 mg - take one every 4 hours as needed -    03/14/2016  f/u ov/Shelby Harrington re: cough since Feb 2017  Chief Complaint  Patient presents with  . Follow-up    Pt reports that the cough is much improved - still not resolved but seems to be getting better over time. Still taking all meds as directed.   taking pepcid 20 mg qhs / chlorpheniramine daily still needed for tickle despite on gabapentin 100 qid flonase for nose no better in past/ no better sense of drainage home vs mountains and helps a lot more than clariton.  Sleeping fine now with or without the chlorpheniramine  No obvious day to day or daytime variability or assoc sob or  excess/ purulent sputum or mucus plugs or hemoptysis or cp or chest tightness, subjective wheeze or overt sinus or hb symptoms. No unusual exp hx or h/o childhood pna/ asthma or knowledge of premature  birth.  Sleeping ok without nocturnal  or early am exacerbation  of respiratory  c/o's or need for noct saba. Also denies any obvious fluctuation of symptoms with weather or environmental changes or other aggravating or alleviating factors except as outlined above   Current Medications, Allergies, Complete Past Medical History, Past Surgical History, Family History, and Social History were reviewed in [employer] record.  ROS  The following are not active complaints unless bolded sore throat, dysphagia, dental problems, itching, sneezing,  nasal congestion or excess/ purulent secretions, ear ache,   fever, chills, sweats, unintended wt loss, classically pleuritic or exertional cp,  orthopnea pnd or leg swelling, presyncope, palpitations, abdominal pain, anorexia, nausea, vomiting, diarrhea  or change in bowel or bladder habits, change in stools or urine, dysuria,hematuria,  rash, arthralgias, visual complaints, headache, numbness, weakness or ataxia or problems with walking or coordination,  change in mood/affect or memory.            Objective:   Physical Exam  amb wf nad    03/14/2016        11/24/2015        161  10/23/15 161 lb 12.8 oz (73.392 kg)  10/08/15 161 lb (73.029 kg)  09/15/15 162 lb (73.483 kg)    Vital signs reviewed   HEENT: nl dentition, turbinates, and oropharynx which is pristine. Nl external ear canals without cough reflex   NECK :  without JVD/Nodes/TM/ nl carotid upstrokes bilaterally   LUNGS: no acc muscle use,  Nl contour chest which is clear to A and P bilaterally without cough on insp or exp maneuvers   CV:  RRR  no s3 or murmur or increase in P2, no edema   ABD:  soft and nontender with nl inspiratory excursion in the supine position. No bruits or organomegaly, bowel sounds nl  MS:  Nl gait/ ext warm without deformities, calf tenderness, cyanosis or clubbing No obvious joint restrictions   SKIN: warm and dry without lesions     NEURO:  alert, approp, nl sensorium with  no motor deficits       I personally reviewed images and agree with radiology impression as follows:  CXR:  08/25/15 No acute cardiopulmonary disease.        Assessment & Plan:

## 2016-03-15 LAB — RESPIRATORY ALLERGY PROFILE REGION II ~~LOC~~
ALLERGEN, CEDAR TREE, T6: 0.12 kU/L — AB
ALLERGEN, MOUSE U PROTEIN, E72: 0.24 kU/L — AB
ALLERGEN, OAK, T7: 0.14 kU/L — AB
Allergen, C. Herbarum, M2: <0.1 kU/L
Allergen, Cottonwood, t14: 0.17 kU/L — ABNORMAL HIGH
Allergen, D pternoyssinus,d7: <0.1 kU/L
Allergen, Mulberry, t76: <0.1 kU/L
BOX ELDER: 0.13 kU/L — AB
Bermuda Grass: 0.2 kU/L — ABNORMAL HIGH
COMMON RAGWEED: 0.22 kU/L — AB
Cat Dander: 3.07 kU/L — ABNORMAL HIGH
Cockroach: <0.1 kU/L
D. farinae: <0.1 kU/L
Dog Dander: 0.34 kU/L — ABNORMAL HIGH
Elm IgE: 0.17 kU/L — ABNORMAL HIGH
IGE (IMMUNOGLOBULIN E), SERUM: 335 kU/L — AB (ref <115)
Johnson Grass: 0.17 kU/L — ABNORMAL HIGH
ROUGH PIGWEED IGE: 0.13 kU/L — AB
SHEEP SORREL IGE: 0.14 kU/L — AB
TIMOTHY GRASS: 0.15 kU/L — AB

## 2016-03-16 ENCOUNTER — Telehealth: Payer: Self-pay | Admitting: Genetic Counselor

## 2016-03-16 NOTE — Telephone Encounter (Signed)
Pt called to reschedule appt after receiving her letter in mail. Due to her work schedule appt was change to 10/23

## 2016-03-16 NOTE — Progress Notes (Signed)
lmtcb

## 2016-03-17 NOTE — Progress Notes (Signed)
Spoke with pt and notified of results per Dr. Wert. Pt verbalized understanding and denied any questions. 

## 2016-03-24 ENCOUNTER — Encounter: Payer: 59 | Admitting: Genetic Counselor

## 2016-03-24 ENCOUNTER — Telehealth: Payer: Self-pay | Admitting: Genetic Counselor

## 2016-03-24 ENCOUNTER — Other Ambulatory Visit: Payer: 59

## 2016-03-24 NOTE — Telephone Encounter (Signed)
Ms. Faye had some questions regarding her upcoming genetic counseling appt.  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  We discussed that this would likely not be an option.  Usually we schedule patients for genetic counseling and then have them go to the lab for blood draw for testing afterward, and this allows [place] to keep track of the sample and submit all of the required paperwork to the lab.  Ms. Limbach also was wondering about getting a "28-gene test" that tests for several different cancer risks, as her daughter's doctor is wanting her to do this, so they can then have this on file for her daughter's history.  She had genetic testing in 2015, at which time she had analysis of 9 genes related to breast and uterine cancers.  We discussed that she could definitely have a more comprehensive test, if she is interested, but I warned her that insurance may not cover the cost of testing.  However, we also discussed that affordable self-pay testing options are available.  She will see Roma Kayser for genetic counseling on October 23rd, so Santiago Glad will update her family history and will review additional genetic testing options at that time.

## 2016-04-25 ENCOUNTER — Encounter: Payer: Self-pay | Admitting: Genetic Counselor

## 2016-04-25 ENCOUNTER — Ambulatory Visit (HOSPITAL_BASED_OUTPATIENT_CLINIC_OR_DEPARTMENT_OTHER): Payer: 59 | Admitting: Genetic Counselor

## 2016-04-25 ENCOUNTER — Other Ambulatory Visit: Payer: 59

## 2016-04-25 DIAGNOSIS — Z8049 Family history of malignant neoplasm of other genital organs: Secondary | ICD-10-CM

## 2016-04-25 DIAGNOSIS — Z803 Family history of malignant neoplasm of breast: Secondary | ICD-10-CM | POA: Diagnosis not present

## 2016-04-25 DIAGNOSIS — Z1379 Encounter for other screening for genetic and chromosomal anomalies: Secondary | ICD-10-CM | POA: Insufficient documentation

## 2016-04-25 NOTE — Progress Notes (Signed)
REFERRING PROVIDER: [name], MD [address] [place], [place] [address]  PRIMARY PROVIDER:  Vidal Schwalbe, MD  PRIMARY REASON FOR VISIT:  1. Family history of malignant neoplasm of breast   2. Family history of uterine cancer      HISTORY OF PRESENT ILLNESS:   Ms. Eberwein, a 66 y.o. female, was seen for a [facility] cancer genetics consultation at the request of Dr. Dema Severin due to a family history of cancer.  Ms. Dragon presents to clinic today to discuss the possibility of a hereditary predisposition to cancer, genetic testing, and to further clarify her future cancer risks, as well as potential cancer risks for family members.  Ms. Kruczek is a 65 y.o. female with no personal history of cancer.  She underwent genetic testing in 2015 through the [facility] panel at [facility].  She was negative for 9 high risk genes.  CANCER HISTORY:   No history exists.     HORMONAL RISK FACTORS:  Menarche was at age 18.  First live birth at age 28.  OCP use for approximately 5 years.  Ovaries intact: yes.  Hysterectomy: no.  Menopausal status: postmenopausal.  HRT use: 0 years. Colonoscopy: yes; 5 polyps on her last colonoscopy, on a 5 year schedule. Mammogram within the last year: yes. Number of breast biopsies: 1. Up to date with pelvic exams:  yes. Any excessive radiation exposure in the past:  no  Past Medical History:  Diagnosis Date  . Family history of malignant neoplasm of breast   . Family history of uterine cancer   . 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 Hypertension     History reviewed. No pertinent surgical history.  Social History   Social History  . Marital status: Married    Spouse name: N/A  . Number of children: N/A  . Years of education: N/A   Social History Main Topics  . Smoking status: Never Smoker  . Smokeless tobacco: Never Used  . Alcohol use 0.0 oz/week     Comment: glass of wine on the  weekends  . Drug use: No  . Sexual activity: Not Asked   Other Topics Concern  . None   Social History Narrative  . None     FAMILY HISTORY:  We obtained a detailed, 4-generation family history.  Significant diagnoses are listed below: Family History  Problem Relation Age of Onset  . Breast cancer Mother 54    deceased at 62  . Hypertension Mother   . Emphysema Mother     smoked  . Skin cancer Father     currently 49; no full siblings  . Hypertension Father   . Diabetes Father   . Uterine cancer Sister 87    currently 47    The patient has two daughters.  Her oldest daughter had a large fibroadenoma removed recently.  She has one sister who was diagnosed with uterine cancer at age 53.  The patient's mother had breast cancer at 47 and died at 63.  She had two maternal half brothers who are deceased. The patient's maternal grandmother had uterine cancer in her 65's.    The patient's father had melanoma in his 73's.  He had maternal half siblings, two brothers and two sisters.  There is no other known cancer in this side of the family.  Patient's maternal ancestors are of [place] and Native American descent, and paternal ancestors are of English descent. There is no reported  Ashkenazi Jewish ancestry. There is no known consanguinity.  GENETIC COUNSELING ASSESSMENT: WREATHA STURGEON is a 65 y.o. female with a family history of early onset uterine and breast cancer which is somewhat suggestive of a hereditary cancer syndrome and predisposition to cancer. We, therefore, discussed and recommended the following at today's visit.   DISCUSSION: WE discussed that about 5-10% of breast cancer and 3-5% of uterine cancer is hereditary.  Most cases of hereditary breast cancer are due to BRCA mutations, while most cases of hereditary uterine cancer are due to Lynch syndrome. We discussed other genes that could be associated with breast and uterine cancer, including PTEN mutations.  Ms. Bauer  was tested with the [facility] panel that looked at 9 genes in 2015.  These included BRCA1/2, PTEN and the Lynch syndrome genes, as well as TP53.  We discussed that sometimes we can see ATM, PALB2, and CHEK2 mutations that can explain early onset breast cancer, but is not typically associated with early onset uterine cancer.    Because she had genetic testing in the past., we would not be able to bill her insurance for a second test.  However, we can offer self pay testing through [facility].  They will charge $250 through a Pilot program that looks at 42 genes. We reviewed the characteristics, features and inheritance patterns of hereditary cancer syndromes. We also discussed genetic testing, including the appropriate family members to test, the process of testing, insurance coverage and turn-around-time for results. We discussed the implications of a negative, positive and/or variant of uncertain significant result. We recommended Ms. Heide Spark pursue genetic testing for the Common Hereditary gene panel. The Hereditary Gene Panel offered by [facility] includes sequencing and/or deletion duplication testing of the following 42 genes: APC, ATM, AXIN2, BARD1, BMPR1A, BRCA1, BRCA2, BRIP1, CDH1, CDKN2A, CHEK2, DICER1, EPCAM, GREM1, KIT, MEN1, MLH1, MSH2, MSH6, MUTYH, NBN, NF1, PALB2, PDGFRA, PMS2, POLD1, POLE, PTEN, RAD50, RAD51C, RAD51D, SDHA, SDHB, SDHC, SDHD, SMAD4, SMARCA4. STK11, TP53, TSC1, TSC2, and VHL.     PLAN: After considering the risks, benefits, and limitations, Ms. Heide Spark  provided informed consent to pursue genetic testing and the blood sample was sent to [facility] for analysis of the Common Hereditary gene panel. Results should be available within approximately 2-3 weeks' time, at which point they will be disclosed by telephone to Ms. Heide Spark, as will any additional recommendations warranted by these results. Ms. Meisenheimer will receive a summary of her genetic counseling visit  and a copy of her results once available. This information will also be available in Epic. We encouraged Ms. Twombly to remain in contact with cancer genetics annually so that we can continuously update the family history and inform her of any changes in cancer genetics and testing that may be of benefit for her family. Ms. Minium questions were answered to her satisfaction today. Our contact information was provided should additional questions or concerns arise.  Lastly, we encouraged Ms. Veale to remain in contact with cancer genetics annually so that we can continuously update the family history and inform her of any changes in cancer genetics and testing that may be of benefit for this family.   Ms.  Selman questions were answered to her satisfaction today. Our contact information was provided should additional questions or concerns arise. Thank you for the referral and allowing [place] to share in the care of your patient.   Corleen Otwell P. Florene Glen, [place], [facility] Certified Genetic Counselor [email or web address] phone: [phone]  The patient was  seen for a total of 30 minutes in face-to-face genetic counseling.  This patient was discussed with Drs. Magrinat, Lindi Adie and/or Burr Medico who agrees with the above.    _______________________________________________________________________ For Office Staff:  Number of people involved in session: 1 Was an Intern/ student involved with case: yes Lady Deutscher

## 2016-04-26 ENCOUNTER — Ambulatory Visit: Payer: 59 | Admitting: Internal Medicine

## 2016-05-06 ENCOUNTER — Encounter: Payer: Self-pay | Admitting: Internal Medicine

## 2016-05-06 ENCOUNTER — Ambulatory Visit (INDEPENDENT_AMBULATORY_CARE_PROVIDER_SITE_OTHER): Payer: 59 | Admitting: Internal Medicine

## 2016-05-06 VITALS — BP 124/86 | HR 51 | Ht 59.0 in | Wt 160.6 lb

## 2016-05-06 DIAGNOSIS — J45991 Cough variant asthma: Secondary | ICD-10-CM

## 2016-05-06 NOTE — Patient Instructions (Addendum)
flonase one puff in each nostril at bedtime and avoid close contact with dogs   Call me if not satisfied and I will be happy to refer you to a good allergist   For drainage / throat tickle try take CHLORPHENIRAMINE  4 mg - take one every 4 hours as needed - available over the counter- may cause drowsiness so start with just a bedtime dose or two and see how you tolerate it before trying in daytime    Please schedule a follow up visit in 3 months but call sooner if needed

## 2016-05-06 NOTE — Progress Notes (Signed)
Subjective:    Patient ID: Shelby Harrington, female    DOB: 08-Apr-1951    MRN: [id number]    Brief patient profile:  6 yowf never smoker ? Born prematurely with "bronchitis at birth" and poor ex tolerance always last to finish any physical activity in groups as long as she can remember but able to do recumbent bike since summer 2016 and in meantime in Feb 2016 developed a daytime and hs cough pattern that  persisted despite symbicort 160 which did not really improve ex tol so referred to pulmonary clinic 09/15/2015 by Dr Dema Severin for further pulmonary eval.       History of Present Illness  09/15/2015  1st [facility] office visit/ Shelby Harrington   Chief Complaint  Patient presents with  . Pulmonary Consult    Referred by Dr. [name]. Pt c/o SOB and cough x 1 yr. Cough has been worse for the past month. Cough is non prod and worse when she lies down and when she talks alot. She gets winded walking up and incline or doing housework such as making her bed.   doe chronic / daily   = MMRC1 = can walk nl pace, flat grade, can't hurry or go uphills or steps s sob  And so far no better on symbicort though hfa technique not ideal (see a/p) and on ppi but not sure of dose, taking pc rec Omeprazole should be 40 mg Take 30-60 min before first meal of the day and take pepcid ac 20 mg one hour before bed with zyrtec  GERD   Continue symbicort 160 Take 2 puffs first thing in am and then another 2 puffs about 12 hours later (take pm dose at least 15 min before your exercise and if not better after 2 weeks stop it Work on inhaler technique:   For cough > tessilon 200 mg up to every 8 hours as needed (won't make you sleepy)    10/23/2015  f/u ov/Shelby Harrington re: cough since Feb 2016 on  ppi ac / pepcid 20 mg not on tessilon  Chief Complaint  Patient presents with  . Follow-up    Breathing is unchanged. She states she is coughing less. No new co's today.   still constant sense of something stuck in throat and  needing to clear it rec Omeprazole continue  40 mg Take 30-60 min before first meal of the day and take pepcid ac 20 mg one hour before bed   Stop zyrtec and For drainage / throat tickle try take CHLORPHENIRAMINE  4 mg - take one every 4 hours as needed - available over the counter- may cause drowsiness so start with just a bedtime dose or two and see Harrington you tolerate it before trying in daytime   Continue symbicort 80 Take 1-2 puffs first thing in am and then another 2 puffs about 12 hours later (take pm dose at least 15 min before your exercise and if not better after 2 weeks stop it Work on inhaler technique:  relax and gently blow all the way out then take a nice smooth deep breath back in, triggering the inhaler at same time you start breathing in.  Hold for up to 5 seconds if you can. Blow out thru nose. Rinse and gargle with water when done For cough > tessilon 200 mg up to every 6 hours as needed (won't make you sleepy and goal is to eliminate all coughing and all  throat clearing  11/24/2015  f/u ov/Shelby Harrington re: cough since feb 2016 on symb 80 2bid and max gerd rx  Chief Complaint  Patient presents with  . Follow-up    pt c/o sob w/activity, cont non prod cough, occ chest heaviness.  did not try the h1 daytime at all  But overall better    Kouffman Reflux v Neurogenic Cough Differentiator Reflux Comments  Do you awaken from a sound sleep coughing violently?                            With trouble breathing? gone    Do you have choking episodes when you cannot  Get enough air, gasping for air ?              gone   Do you usually cough when you lie down into  The bed, or when you just lie down to rest ?                          Better ,min   Do you usually cough after meals or eating?         no   Do you cough when (or after) you bend over?    no   GERD SCORE     Kouffman Reflux v Neurogenic Cough Differentiator Neurogenic   Do you more-or-less cough all day long? No   Does change  of temperature make you cough? Some still   Does laughing or chuckling cause you to cough? occ   Do fumes (perfume, automobile fumes, burned  Toast, etc.,) cause you to cough ?      no   Does speaking, singing, or talking on the phone cause you to cough   ?               Yes talking    Neurogenic/Airway score     rec Try off symbicort and on the chlorpheniramine during the day and if still coughing after memorial day the next step is: Add gabapentin 100 mg three times daily x 2 weeks then call if not better at that point - if better stay on it for 6 weeks and return her to simplify your treatment plan    01/27/2016  f/u ov/Shelby Harrington re: cough since Feb 2016  Chief Complaint  Patient presents with  . Follow-up    Cough has improved some, but not completely resolved. She also c/o PND.    on avg using h1 bid at most  Gabapentin 100 mg tid  Min noct cough  Breathing better off inhalers and Not limited by breathing from desired activities   rec Automatic = pepcid 20 mg at bedtime and gabapentin is 100 mg four times daily  For drainage / throat tickle try take CHLORPHENIRAMINE  4 mg - take one every 4 hours as needed -    03/14/2016  f/u ov/Shelby Harrington re: cough since Feb 2016  Chief Complaint  Patient presents with  . Follow-up    Pt reports that the cough is much improved - still not resolved but seems to be getting better over time. Still taking all meds as directed.   taking pepcid 20 mg qhs / chlorpheniramine daily still needed for tickle despite on gabapentin 100 qid flonase for nose no better in past/ no better sense of drainage home vs mountains and helps a lot more than clariton.  Sleeping fine now with or without the  chlorpheniramine rec  Add singulair 10 mg each pm to present medications For drainage / throat tickle try take CHLORPHENIRAMINE  4 mg - take one every 4 hours as needed  Allergy testing IgE 335 > Pos for Cats > dogs/ grass / ragweed    05/06/2016  f/u ov/Shelby Harrington re: cough present  since Feb 2016 singulair/ gabapentin / pepcid at hs - Dog in bedroom Chief Complaint  Patient presents with  . Follow-up    6wk rov. pt states breathing is doing well. c/o non prod cough but has improved since last ov.    Not limited by breathing from desired activities  / has not tried chlorpheniramine  No obvious day to day or daytime variability or assoc sob or  excess/ purulent sputum or mucus plugs or hemoptysis or cp or chest tightness, subjective wheeze or overt sinus or hb symptoms. No unusual exp hx or h/o childhood pna/ asthma or knowledge of premature birth.  Sleeping ok without nocturnal  or early am exacerbation  of respiratory  c/o's or need for noct saba. Also denies any obvious fluctuation of symptoms with weather or environmental changes or other aggravating or alleviating factors except as outlined above   Current Medications, Allergies, Complete Past Medical History, Past Surgical History, Family History, and Social History were reviewed in [employer] record.  ROS  The following are not active complaints unless bolded sore throat, dysphagia, dental problems, itching, sneezing,  nasal congestion mostly in ams or excess/ purulent secretions, ear ache,   fever, chills, sweats, unintended wt loss, classically pleuritic or exertional cp,  orthopnea pnd or leg swelling, presyncope, palpitations, abdominal pain, anorexia, nausea, vomiting, diarrhea  or change in bowel or bladder habits, change in stools or urine, dysuria,hematuria,  rash, arthralgias, visual complaints, headache, numbness, weakness or ataxia or problems with walking or coordination,  change in mood/affect or memory.            Objective:   Physical Exam  amb wf nad    05/06/2016    161     11/24/2015        161  10/23/15 161 lb 12.8 oz (73.392 kg)  10/08/15 161 lb (73.029 kg)  09/15/15 162 lb (73.483 kg)    Vital signs reviewed  - Note on arrival 02 sats  98% on RA  HEENT: nl  dentition, turbinates, and oropharynx s cobblestoning or excess pnd. Nl external ear canals without cough reflex   NECK :  without JVD/Nodes/TM/ nl carotid upstrokes bilaterally   LUNGS: no acc muscle use,  Nl contour chest which is clear to A and P bilaterally without cough on insp or exp maneuvers   CV:  RRR  no s3 or murmur or increase in P2, no edema   ABD:  soft and nontender with nl inspiratory excursion in the supine position. No bruits or organomegaly, bowel sounds nl  MS:  Nl gait/ ext warm without deformities, calf tenderness, cyanosis or clubbing No obvious joint restrictions   SKIN: warm and dry without lesions    NEURO:  alert, approp, nl sensorium with  no motor deficits             Assessment & Plan:

## 2016-05-07 ENCOUNTER — Encounter: Payer: Self-pay | Admitting: Internal Medicine

## 2016-05-07 NOTE — Assessment & Plan Note (Addendum)
10/23/2015  extensive coaching HFA effectiveness =    75% > try change symbicort to 80 2bid FENO 10/23/2015  = 11 - Spirometry 10/23/2015  wnl including fef 25-75 p symbicort with reported ongoing doe - 10/23/2015  Walked RA x 3 laps @ 185 ft each stopped due to  Sob/cough s desat at very fast  pace  - 11/24/2015 trial off symbicort due to uacs features  - 12/24/2015 called to report gabapentin 100 helps but only for 6 hours > rec qid dosing and f/u ov to titrate further> did not understand qid at ov 01/27/2016 > rechallnenge with qid and increase h1 as tol/needed  - Allergy profile 03/14/2016 >  Eos 0.2 /  IgE  335 RAST pos Cat > dog, grass, ragweed    - singulair trial 03/14/2016 > improved 05/06/2016 despite dog still in bedroom, wakes up with nasal congestion > rec avoid dog in bedroom, try otc flonase with afrin x 5 days limit  Overall much improved despite not observing avoidance measures, main symptoms are nasal so rec no change in other meds x 3 m then consier gabapentin taper/ reviewed use /recs for h1 for dripping nose also  I had an extended discussion with the patient reviewing all relevant studies completed to date and  lasting 15 to 20 minutes of a 25 minute visit    Each maintenance medication was reviewed in detail including most importantly the difference between maintenance and prns and under what circumstances the prns are to be triggered using an action plan format that is not reflected in the computer generated alphabetically organized AVS.    Please see instructions for details which were reviewed in writing and the patient given a copy highlighting the part that I personally wrote and discussed at today's ov.

## 2016-05-13 ENCOUNTER — Encounter: Payer: Self-pay | Admitting: Hematology & Oncology

## 2016-05-13 ENCOUNTER — Other Ambulatory Visit: Payer: Self-pay | Admitting: Internal Medicine

## 2016-05-13 DIAGNOSIS — J45991 Cough variant asthma: Secondary | ICD-10-CM

## 2016-05-16 ENCOUNTER — Other Ambulatory Visit: Payer: Self-pay | Admitting: Family

## 2016-05-16 DIAGNOSIS — D582 Other hemoglobinopathies: Secondary | ICD-10-CM

## 2016-05-19 ENCOUNTER — Ambulatory Visit (HOSPITAL_BASED_OUTPATIENT_CLINIC_OR_DEPARTMENT_OTHER): Payer: 59

## 2016-05-19 ENCOUNTER — Other Ambulatory Visit: Payer: 59

## 2016-05-19 ENCOUNTER — Ambulatory Visit (HOSPITAL_BASED_OUTPATIENT_CLINIC_OR_DEPARTMENT_OTHER): Payer: 59 | Admitting: Family

## 2016-05-19 ENCOUNTER — Ambulatory Visit: Payer: 59 | Admitting: Family

## 2016-05-19 DIAGNOSIS — Z7982 Long term (current) use of aspirin: Secondary | ICD-10-CM

## 2016-05-19 DIAGNOSIS — D582 Other hemoglobinopathies: Secondary | ICD-10-CM

## 2016-05-19 LAB — CBC WITH DIFFERENTIAL (CANCER CENTER ONLY)
BASO#: 0 10*3/uL (ref 0.0–0.2)
BASO%: 0.6 % (ref 0.0–2.0)
EOS%: 3.6 % (ref 0.0–7.0)
Eosinophils Absolute: 0.2 10*3/uL (ref 0.0–0.5)
HCT: 46.9 % — ABNORMAL HIGH (ref 34.8–46.6)
HGB: 16.5 g/dL — ABNORMAL HIGH (ref 11.6–15.9)
LYMPH#: 2.4 10*3/uL (ref 0.9–3.3)
LYMPH%: 35.7 % (ref 14.0–48.0)
MCH: 32 pg (ref 26.0–34.0)
MCHC: 35.2 g/dL (ref 32.0–36.0)
MCV: 91 fL (ref 81–101)
MONO#: 0.8 10*3/uL (ref 0.1–0.9)
MONO%: 11.7 % (ref 0.0–13.0)
NEUT#: 3.2 10*3/uL (ref 1.5–6.5)
NEUT%: 48.4 % (ref 39.6–80.0)
PLATELETS: 176 10*3/uL (ref 145–400)
RBC: 5.16 10*6/uL (ref 3.70–5.32)
RDW: 13.1 % (ref 11.1–15.7)
WBC: 6.7 10*3/uL (ref 3.9–10.0)

## 2016-05-19 LAB — CMP (CANCER CENTER ONLY)
ALK PHOS: 82 U/L (ref 26–84)
ALT: 34 U/L (ref 10–47)
AST: 34 U/L (ref 11–38)
Albumin: 4.1 g/dL (ref 3.3–5.5)
BILIRUBIN TOTAL: 0.5 mg/dL (ref 0.20–1.60)
BUN: 23 mg/dL — AB (ref 7–22)
CALCIUM: 9.2 mg/dL (ref 8.0–10.3)
CO2: 31 meq/L (ref 18–33)
Chloride: 103 mEq/L (ref 98–108)
Creat: 1 mg/dl (ref 0.6–1.2)
GLUCOSE: 99 mg/dL (ref 73–118)
POTASSIUM: 4.2 meq/L (ref 3.3–4.7)
Sodium: 140 mEq/L (ref 128–145)
Total Protein: 6.7 g/dL (ref 6.4–8.1)

## 2016-05-19 LAB — CHCC SATELLITE - SMEAR

## 2016-05-19 NOTE — Patient Instructions (Signed)
Therapeutic Phlebotomy Therapeutic phlebotomy is the controlled removal of blood from a person's body for the purpose of treating a medical condition. The procedure is similar to donating blood. Usually, about a pint (470 mL, or 0.47L) of blood is removed. The average adult has 9-12 pints (4.3-5.7 L) of blood. Therapeutic phlebotomy may be used to treat the following medical conditions:  Hemochromatosis. This is a condition in which the blood contains too much iron.  Polycythemia vera. This is a condition in which the blood contains too many red blood cells.  Porphyria cutanea tarda. This is a disease in which an important part of hemoglobin is not made properly. It results in the buildup of abnormal amounts of porphyrins in the body.  Sickle cell disease. This is a condition in which the red blood cells form an abnormal crescent shape rather than a round shape. Tell a health care provider about:  Any allergies you have.  All medicines you are taking, including vitamins, herbs, eye drops, creams, and over-the-counter medicines.  Any problems you or family members have had with anesthetic medicines.  Any blood disorders you have.  Any surgeries you have had.  Any medical conditions you have. What are the risks? Generally, this is a safe procedure. However, problems may occur, including:  Nausea or light-headedness.  Low blood pressure.  Soreness, bleeding, swelling, or bruising at the needle insertion site.  Infection. What happens before the procedure?  Follow instructions from your health care provider about eating or drinking restrictions.  Ask your health care provider about changing or stopping your regular medicines. This is especially important if you are taking diabetes medicines or blood thinners.  Wear clothing with sleeves that can be raised above the elbow.  Plan to have someone take you home after the procedure.  You may have a blood sample taken. What happens  during the procedure?  A needle will be inserted into one of your veins.  Tubing and a collection bag will be attached to that needle.  Blood will flow through the needle and tubing into the collection bag.  You may be asked to open and close your hand slowly and continually during the entire collection.  After the specified amount of blood has been removed from your body, the collection bag and tubing will be clamped.  The needle will be removed from your vein.  Pressure will be held on the site of the needle insertion to stop the bleeding.  A bandage (dressing) will be placed over the needle insertion site. The procedure may vary among health care providers and hospitals. What happens after the procedure?  Your recovery will be assessed and monitored.  You can return to your normal activities as directed by your health care provider. This information is not intended to replace advice given to you by your health care provider. Make sure you discuss any questions you have with your health care provider. Document Released: 11/22/2010 Document Revised: 02/20/2016 Document Reviewed: 06/16/2014 Elsevier Interactive Patient Education  2017 Elsevier Inc.  

## 2016-05-19 NOTE — Progress Notes (Signed)
Shelby Harrington presents today for phlebotomy per MD orders. Phlebotomy procedure started at 1640 and ended at 1650. 500 grams removed. Patient observed for 30 minutes after procedure without any incident. Patient tolerated procedure well. IV needle removed intact.

## 2016-05-19 NOTE — Progress Notes (Signed)
Hematology and Oncology Follow Up Visit  Shelby Harrington [id number] July 02, 1951 65 y.o. 05/19/2016   Principle Diagnosis:  Hemochromatosis - Heterozygous H63D mutation      Current Therapy:   Aspirin 81 mg PO daily  Phlebotomy to keep ferritin < 100    Interim History:  Shelby Harrington is here today for follow-up and phlebotomy. Her Hgb on recent lab work with her PCP was 17 with a Hct of 50.7. Her iron saturation was 33% with a ferritin of 99.  She has been symptomatic with HTN, palpitations and a few episodes of chest pain. She is on Lopressor and recently had her dose adjusted.   No falls or syncopal episodes. No history of blood clots or liver issues.  No fever, chills, n/v, rash, dizziness, abdominal pain or changes in bowel or bladder habits.  She has a history of SOB with exertion and sees pulmonology. She also has a chronic cough and takes Pepcid and Singulair. She also takes Neurontin for lung related nerve pain.  No swelling, tenderness, numbness or tingling in her extremities. No c/o pain.  She has maintained a good appetite and is staying well hydrated. Her weight is stable.   Medications:    Medication List       Accurate as of 05/19/16  4:52 PM. Always use your most recent med list.          aspirin 81 MG tablet Take 81 mg by mouth daily.   CALCIUM PO Take 1 capsule by mouth daily.   CENTRUM SILVER PO Take 1 tablet by mouth daily.   chlorpheniramine 4 MG tablet Commonly known as:  CHLOR-TRIMETON Take 4 mg by mouth every 4 (four) hours as needed for allergies.   cyclobenzaprine 10 MG tablet Commonly known as:  FLEXERIL Take 10 mg by mouth at bedtime as needed for muscle spasms.   famotidine 20 MG tablet Commonly known as:  PEPCID Take 20 mg by mouth at bedtime.   gabapentin 100 MG capsule Commonly known as:  NEURONTIN TAKE 1 CAPSULE (100 MG TOTAL) BY MOUTH 4 (FOUR) TIMES DAILY.   GLUCOSAMINE CHONDR COMPLEX PO Take 1 capsule by mouth daily.     metoprolol succinate 50 MG 24 hr tablet Commonly known as:  TOPROL-XL Take 50 mg by mouth daily.   montelukast 10 MG tablet Commonly known as:  SINGULAIR Take 1 tablet (10 mg total) by mouth at bedtime.   SM VITAMIN D3 4000 units Caps Generic drug:  Cholecalciferol Take 1 capsule by mouth daily.       Allergies:  Allergies  Allergen Reactions  . Codeine   . Latex   . Penicillins   . Prednisone Palpitations    Past Medical History, Surgical history, Social history, and Family History were reviewed and updated.  Review of Systems: All other 10 point review of systems is negative.   Physical Exam:  weight is 159 lb (72.1 kg). Her oral temperature is 98.2 F (36.8 C). Her blood pressure is 159/85 (abnormal) and her pulse is 57 (abnormal). Her respiration is 18 and oxygen saturation is 98%.   Wt Readings from Last 3 Encounters:  05/19/16 159 lb (72.1 kg)  05/06/16 160 lb 9.6 oz (72.8 kg)  03/14/16 160 lb 12.8 oz (72.9 kg)    Ocular: Sclerae unicteric, pupils equal, round and reactive to light Ear-nose-throat: Oropharynx clear, dentition fair Lymphatic: No cervical supraclavicular or axillary adenopathy Lungs no rales or rhonchi, good excursion bilaterally Heart regular rate and rhythm, no  murmur appreciated Abd soft, nontender, positive bowel sounds, no liver or spleen tip palpated on exam, no fluid wave  MSK no focal spinal tenderness, no joint edema Neuro: non-focal, well-oriented, appropriate affect Breasts: Deferred  Lab Results  Component Value Date   WBC 6.7 05/19/2016   HGB 16.5 (H) 05/19/2016   HCT 46.9 (H) 05/19/2016   MCV 91 05/19/2016   PLT 176 05/19/2016   Lab Results  Component Value Date   FERRITIN 88 01/25/2016   IRON 119 01/25/2016   TIBC 338 01/25/2016   UIBC 219 01/25/2016   IRONPCTSAT 35 01/25/2016   Lab Results  Component Value Date   RBC 5.16 05/19/2016   No results found for: KPAFRELGTCHN, LAMBDASER, KAPLAMBRATIO No results  found for: IGGSERUM, IGA, IGMSERUM No results found for: Odetta Pink, SPEI   Chemistry      Component Value Date/Time   NA 140 05/19/2016 1604   NA 143 10/08/2015 1316   K 4.2 05/19/2016 1604   K 4.1 10/08/2015 1316   CL 103 05/19/2016 1604   CO2 31 05/19/2016 1604   CO2 27 10/08/2015 1316   BUN 23 (H) 05/19/2016 1604   BUN 17.4 10/08/2015 1316   CREATININE 1.0 05/19/2016 1604   CREATININE 1.1 10/08/2015 1316      Component Value Date/Time   CALCIUM 9.2 05/19/2016 1604   CALCIUM 9.4 10/08/2015 1316   ALKPHOS 82 05/19/2016 1604   ALKPHOS 96 10/08/2015 1316   AST 34 05/19/2016 1604   AST 28 10/08/2015 1316   ALT 34 05/19/2016 1604   ALT 29 10/08/2015 1316   BILITOT 0.50 05/19/2016 1604   BILITOT 0.30 10/08/2015 1316     Impression and Plan: Shelby Harrington is a 65 yo white female with hemochromatosis - heterozygous for the H63D mutation. She has never been phlebotomized. So far her iron studies have remained stable however on recent blood work with her PCP her Hgb was elevated at 17.0. She is symptomatic with HTN, palpitations and occasional chest discomfort.  We will phlebotomize her today. She will continue taking her 1 baby aspirin daily.  We will see what her JAK2 study shows as well as her iron studies. For now, we will plan to see her back in 1 month for repeat lab work and follow-up.  She will contact us with any questions or concerns. We can certainly see her sooner if need be.   Eliezer Bottom, NP 11/16/20174:52 PM

## 2016-05-20 LAB — FERRITIN: Ferritin: 82 ng/ml (ref 9–269)

## 2016-05-20 LAB — IRON AND TIBC
%SAT: 22 % (ref 21–57)
Iron: 71 ug/dL (ref 41–142)
TIBC: 324 ug/dL (ref 236–444)
UIBC: 252 ug/dL (ref 120–384)

## 2016-05-31 ENCOUNTER — Telehealth: Payer: Self-pay | Admitting: Genetic Counselor

## 2016-05-31 ENCOUNTER — Ambulatory Visit: Payer: Self-pay | Admitting: Genetic Counselor

## 2016-05-31 DIAGNOSIS — Z803 Family history of malignant neoplasm of breast: Secondary | ICD-10-CM

## 2016-05-31 DIAGNOSIS — Z8049 Family history of malignant neoplasm of other genital organs: Secondary | ICD-10-CM

## 2016-05-31 DIAGNOSIS — Z1379 Encounter for other screening for genetic and chromosomal anomalies: Secondary | ICD-10-CM

## 2016-05-31 NOTE — Progress Notes (Signed)
HPI: [name] was previously seen in the [facility] due to a family history of cancer and concerns regarding a hereditary predisposition to cancer. Please refer to our prior cancer genetics clinic note for more information regarding Shelby Harrington's medical, social and family histories, and our assessment and recommendations, at the time. Shelby Harrington recent genetic test results were disclosed to her, as were recommendations warranted by these results. These results and recommendations are discussed in more detail below.  FAMILY HISTORY:  We obtained a detailed, 4-generation family history.  Significant diagnoses are listed below: Family History  Problem Relation Age of Onset  . Breast cancer Mother 77    deceased at 2  . Hypertension Mother   . Emphysema Mother     smoked  . Skin cancer Father     currently 110; no full siblings  . Hypertension Father   . Diabetes Father   . Uterine cancer Sister 58    currently 1    The patient has two daughters.  Her oldest daughter had a large fibroadenoma removed recently.  She has one sister who was diagnosed with uterine cancer at age 63.  The patient's mother had breast cancer at 73 and died at 21.  She had two maternal half brothers who are deceased. The patient's maternal grandmother had uterine cancer in her 10's.    The patient's father had melanoma in his 22's.  He had maternal half siblings, two brothers and two sisters.  There is no other known cancer in this side of the family.  Patient's maternal ancestors are of [place] and Native American descent, and paternal ancestors are of English descent. There is no reported Ashkenazi Jewish ancestry. There is no known consanguinity.  GENETIC TEST RESULTS: Genetic testing reported out on May 29, 2016 through the Common Hereditary cancer panel found no deleterious mutations.  The Hereditary Gene Panel offered by [facility] includes sequencing and/or deletion duplication  testing of the following 42 genes: APC, ATM, AXIN2, BARD1, BMPR1A, BRCA1, BRCA2, BRIP1, CDH1, CDKN2A, CHEK2, DICER1, EPCAM, GREM1, KIT, MEN1, MLH1, MSH2, MSH6, MUTYH, NBN, NF1, PALB2, PDGFRA, PMS2, POLD1, POLE, PTEN, RAD50, RAD51C, RAD51D, SDHA, SDHB, SDHC, SDHD, SMAD4, SMARCA4. STK11, TP53, TSC1, TSC2, and VHL.  The test report has been scanned into EPIC and is located under the Molecular Pathology section of the Results Review tab.   We discussed with Shelby Harrington that since the current genetic testing is not perfect, it is possible there may be a gene mutation in one of these genes that current testing cannot detect, but that chance is small. We also discussed, that it is possible that another gene that has not yet been discovered, or that we have not yet tested, is responsible for the cancer diagnoses in the family, and it is, therefore, important to remain in touch with cancer genetics in the future so that we can continue to offer Shelby Harrington the most up to date genetic testing.   CANCER SCREENING RECOMMENDATIONS: This normal result is reassuring and indicates that Shelby Harrington does not likely have an increased risk of cancer due to a mutation in one of these genes.  We, therefore, recommended  Shelby Harrington continue to follow the cancer screening guidelines provided by her primary healthcare providers.   RECOMMENDATIONS FOR FAMILY MEMBERS: Women in this family might be at some increased risk of developing cancer, over the general population risk, simply due to the family history of cancer. We recommended women in this family  have a yearly mammogram beginning at age 7, or 29 years younger than the earliest onset of cancer, an annual clinical breast exam, and perform monthly breast self-exams. Women in this family should also have a gynecological exam as recommended by their primary provider. All family members should have a colonoscopy by age 16.  FOLLOW-UP: Lastly, we discussed with Shelby Harrington  that cancer genetics is a rapidly advancing field and it is possible that new genetic tests will be appropriate for her and/or her family members in the future. We encouraged her to remain in contact with cancer genetics on an annual basis so we can update her personal and family histories and let her know of advances in cancer genetics that may benefit this family.   Our contact number was provided. Shelby Harrington questions were answered to her satisfaction, and she knows she is welcome to call us at anytime with additional questions or concerns.   Roma Kayser, MS, [facility] Certified Genetic Counselor [email or web address]

## 2016-05-31 NOTE — Telephone Encounter (Signed)
LM on VM with good news.  Asked that she CB. 

## 2016-05-31 NOTE — Telephone Encounter (Signed)
Revealed negative genetic testing on the common hereditary cancer panel.  Discussed that she had negative genetic testing in the past, and this added on additional 33 genes.  Asked that she keep in contact with [place] in the future in case there are additional genes that she may need to be tested for.  I released the results to her online.

## 2016-06-21 ENCOUNTER — Ambulatory Visit (HOSPITAL_BASED_OUTPATIENT_CLINIC_OR_DEPARTMENT_OTHER): Payer: 59 | Admitting: Family

## 2016-06-21 ENCOUNTER — Other Ambulatory Visit (HOSPITAL_BASED_OUTPATIENT_CLINIC_OR_DEPARTMENT_OTHER): Payer: 59

## 2016-06-21 LAB — IRON AND TIBC
%SAT: 62 % — AB (ref 21–57)
Iron: 228 ug/dL — ABNORMAL HIGH (ref 41–142)
TIBC: 370 ug/dL (ref 236–444)
UIBC: 142 ug/dL (ref 120–384)

## 2016-06-21 LAB — COMPREHENSIVE METABOLIC PANEL
ALBUMIN: 3.7 g/dL (ref 3.5–5.0)
ALT: 44 U/L (ref 0–55)
AST: 38 U/L — AB (ref 5–34)
Alkaline Phosphatase: 84 U/L (ref 40–150)
Anion Gap: 9 mEq/L (ref 3–11)
BUN: 23.6 mg/dL (ref 7.0–26.0)
CO2: 29 meq/L (ref 22–29)
Calcium: 9.2 mg/dL (ref 8.4–10.4)
Chloride: 105 mEq/L (ref 98–109)
Creatinine: 1.1 mg/dL (ref 0.6–1.1)
EGFR: 53 mL/min/{1.73_m2} — AB (ref >90)
GLUCOSE: 103 mg/dL (ref 70–140)
POTASSIUM: 3.6 meq/L (ref 3.5–5.1)
SODIUM: 143 meq/L (ref 136–145)
Total Bilirubin: 0.75 mg/dL (ref 0.20–1.20)
Total Protein: 6.7 g/dL (ref 6.4–8.3)

## 2016-06-21 LAB — CBC WITH DIFFERENTIAL (CANCER CENTER ONLY)
BASO#: 0.1 10*3/uL (ref 0.0–0.2)
BASO%: 0.8 % (ref 0.0–2.0)
EOS ABS: 0.3 10*3/uL (ref 0.0–0.5)
EOS%: 4.7 % (ref 0.0–7.0)
HCT: 47.7 % — ABNORMAL HIGH (ref 34.8–46.6)
HEMOGLOBIN: 16.5 g/dL — AB (ref 11.6–15.9)
LYMPH#: 1.9 10*3/uL (ref 0.9–3.3)
LYMPH%: 32.8 % (ref 14.0–48.0)
MCH: 32.5 pg (ref 26.0–34.0)
MCHC: 34.6 g/dL (ref 32.0–36.0)
MCV: 94 fL (ref 81–101)
MONO#: 0.6 10*3/uL (ref 0.1–0.9)
MONO%: 9.6 % (ref 0.0–13.0)
NEUT%: 52.1 % (ref 39.6–80.0)
NEUTROS ABS: 3.1 10*3/uL (ref 1.5–6.5)
PLATELETS: 183 10*3/uL (ref 145–400)
RBC: 5.07 10*6/uL (ref 3.70–5.32)
RDW: 13.3 % (ref 11.1–15.7)
WBC: 5.9 10*3/uL (ref 3.9–10.0)

## 2016-06-21 LAB — FERRITIN: Ferritin: 46 ng/ml (ref 9–269)

## 2016-06-21 NOTE — Progress Notes (Signed)
Hematology and Oncology Follow Up Visit  Shelby Harrington [id number] 30-May-1951 65 y.o. 06/21/2016   Principle Diagnosis:  Hemochromatosis - Heterozygous H63D mutation JAK-2 negative  Current Therapy:   Aspirin 81 mg PO daily  Phlebotomy to keep ferritin < 100    Interim History:  Shelby Harrington is here today for follow-up. She is doing fairly well. She has some occasional fatigue. Hct today is 47.7. Iron studies in November were stable. She did receive a phlebotomy in November for her Hct of 50.0.  She verbalized that she is taking her 1 baby aspirin daily.  No fever, chills, n/v, rash, abdominal pain or changes in bowel or bladder habits.  She has occasional dizziness due to vertigo.  She has a history of SOB with exertion and sees pulmonology. She also has a chronic cough and takes Pepcid and Singulair. She is still on Neurontin for lung related nerve pain.  No swelling, tenderness, numbness or tingling in her extremities. No c/o pain.  She has maintained a good appetite and is staying well hydrated. Her weight is stable.   Medications:  Allergies as of 06/21/2016      Reactions   Codeine    Latex    Penicillins    Prednisone Palpitations      Medication List       Accurate as of 06/21/16  8:54 AM. Always use your most recent med list.          aspirin 81 MG tablet Take 81 mg by mouth daily.   CALCIUM PO Take 1 capsule by mouth daily.   CENTRUM SILVER PO Take 1 tablet by mouth daily.   chlorpheniramine 4 MG tablet Commonly known as:  CHLOR-TRIMETON Take 4 mg by mouth every 4 (four) hours as needed for allergies.   cyclobenzaprine 10 MG tablet Commonly known as:  FLEXERIL Take 10 mg by mouth at bedtime as needed for muscle spasms.   famotidine 20 MG tablet Commonly known as:  PEPCID Take 20 mg by mouth at bedtime.   fluticasone 50 MCG/ACT nasal spray Commonly known as:  FLONASE daily.   gabapentin 100 MG capsule Commonly known as:  NEURONTIN TAKE 1  CAPSULE (100 MG TOTAL) BY MOUTH 4 (FOUR) TIMES DAILY.   GLUCOSAMINE CHONDR COMPLEX PO Take 1 capsule by mouth daily.   metoprolol succinate 50 MG 24 hr tablet Commonly known as:  TOPROL-XL Take 50 mg by mouth daily.   montelukast 10 MG tablet Commonly known as:  SINGULAIR Take 1 tablet (10 mg total) by mouth at bedtime.   SM VITAMIN D3 4000 units Caps Generic drug:  Cholecalciferol Take 1 capsule by mouth daily.       Allergies:  Allergies  Allergen Reactions  . Codeine   . Latex   . Penicillins   . Prednisone Palpitations    Past Medical History, Surgical history, Social history, and Family History were reviewed and updated.  Review of Systems: All other 10 point review of systems is negative.   Physical Exam:  vitals were not taken for this visit.  Wt Readings from Last 3 Encounters:  05/19/16 159 lb (72.1 kg)  05/06/16 160 lb 9.6 oz (72.8 kg)  03/14/16 160 lb 12.8 oz (72.9 kg)    Ocular: Sclerae unicteric, pupils equal, round and reactive to light Ear-nose-throat: Oropharynx clear, dentition fair Lymphatic: No cervical supraclavicular or axillary adenopathy Lungs no rales or rhonchi, good excursion bilaterally Heart regular rate and rhythm, no murmur appreciated Abd soft, nontender, positive  bowel sounds, no liver or spleen tip palpated on exam, no fluid wave  MSK no focal spinal tenderness, no joint edema Neuro: non-focal, well-oriented, appropriate affect Breasts: Deferred  Lab Results  Component Value Date   WBC 5.9 06/21/2016   HGB 16.5 (H) 06/21/2016   HCT 47.7 (H) 06/21/2016   MCV 94 06/21/2016   PLT 183 06/21/2016   Lab Results  Component Value Date   FERRITIN 82 05/19/2016   IRON 71 05/19/2016   TIBC 324 05/19/2016   UIBC 252 05/19/2016   IRONPCTSAT 22 05/19/2016   Lab Results  Component Value Date   RBC 5.07 06/21/2016   No results found for: KPAFRELGTCHN, LAMBDASER, KAPLAMBRATIO No results found for: IGGSERUM, IGA, IGMSERUM No  results found for: Odetta Pink, SPEI   Chemistry      Component Value Date/Time   NA 140 05/19/2016 1604   NA 143 10/08/2015 1316   K 4.2 05/19/2016 1604   K 4.1 10/08/2015 1316   CL 103 05/19/2016 1604   CO2 31 05/19/2016 1604   CO2 27 10/08/2015 1316   BUN 23 (H) 05/19/2016 1604   BUN 17.4 10/08/2015 1316   CREATININE 1.0 05/19/2016 1604   CREATININE 1.1 10/08/2015 1316      Component Value Date/Time   CALCIUM 9.2 05/19/2016 1604   CALCIUM 9.4 10/08/2015 1316   ALKPHOS 82 05/19/2016 1604   ALKPHOS 96 10/08/2015 1316   AST 34 05/19/2016 1604   AST 28 10/08/2015 1316   ALT 34 05/19/2016 1604   ALT 29 10/08/2015 1316   BILITOT 0.50 05/19/2016 1604   BILITOT 0.30 10/08/2015 1316     Impression and Plan: Shelby Harrington is a 65 yo white female with hemochromatosis - heterozygous for the H63D mutation. She was JAK-2 negative. Her Hct today is 47.7.  We will hold off on phlebotomizing her today and see what her iron studies show. We will bring her back in later this week for phlebotomy if needed.  She will continue on her 1 baby aspirin daily.  For now, we will plan to see her back in 3 months for repeat lab work and follow-up.  She will contact us with any questions or concerns. We can certainly see her sooner if need be.   Eliezer Bottom, NP 12/19/20178:54 AM

## 2016-06-23 ENCOUNTER — Other Ambulatory Visit: Payer: 59

## 2016-06-23 ENCOUNTER — Telehealth: Payer: Self-pay | Admitting: *Deleted

## 2016-06-23 ENCOUNTER — Ambulatory Visit: Payer: 59 | Admitting: Family

## 2016-06-23 NOTE — Telephone Encounter (Addendum)
Patient is aware of results. Appointment made  ----- Message from Eliezer Bottom, NP sent at 06/22/2016  4:39 PM EST ----- Regarding: Phlebotomy Iron is up, she will need 1 phlebotomy scheduled please. Thank you!  Sarah  ----- Message ----- From: Interface, Lab In Three Zero One Sent: 06/21/2016   8:22 AM To: Eliezer Bottom, NP

## 2016-06-28 ENCOUNTER — Ambulatory Visit (HOSPITAL_BASED_OUTPATIENT_CLINIC_OR_DEPARTMENT_OTHER): Payer: 59

## 2016-06-28 NOTE — Progress Notes (Signed)
Shelby Harrington presents today for phlebotomy per MD orders. Phlebotomy procedure started at 1500 and ended at 1523. 500 grams removed using 16 g phlebotomy kit. Patient observed for 30 minutes after procedure without any incident. Patient tolerated procedure well. IV needle removed intact.

## 2016-06-28 NOTE — Patient Instructions (Signed)
Therapeutic Phlebotomy Therapeutic phlebotomy is the controlled removal of blood from a person's body for the purpose of treating a medical condition. The procedure is similar to donating blood. Usually, about a pint (470 mL, or 0.47L) of blood is removed. The average adult has 9-12 pints (4.3-5.7 L) of blood. Therapeutic phlebotomy may be used to treat the following medical conditions:  Hemochromatosis. This is a condition in which the blood contains too much iron.  Polycythemia vera. This is a condition in which the blood contains too many red blood cells.  Porphyria cutanea tarda. This is a disease in which an important part of hemoglobin is not made properly. It results in the buildup of abnormal amounts of porphyrins in the body.  Sickle cell disease. This is a condition in which the red blood cells form an abnormal crescent shape rather than a round shape. Tell a health care provider about:  Any allergies you have.  All medicines you are taking, including vitamins, herbs, eye drops, creams, and over-the-counter medicines.  Any problems you or family members have had with anesthetic medicines.  Any blood disorders you have.  Any surgeries you have had.  Any medical conditions you have. What are the risks? Generally, this is a safe procedure. However, problems may occur, including:  Nausea or light-headedness.  Low blood pressure.  Soreness, bleeding, swelling, or bruising at the needle insertion site.  Infection. What happens before the procedure?  Follow instructions from your health care provider about eating or drinking restrictions.  Ask your health care provider about changing or stopping your regular medicines. This is especially important if you are taking diabetes medicines or blood thinners.  Wear clothing with sleeves that can be raised above the elbow.  Plan to have someone take you home after the procedure.  You may have a blood sample taken. What happens  during the procedure?  A needle will be inserted into one of your veins.  Tubing and a collection bag will be attached to that needle.  Blood will flow through the needle and tubing into the collection bag.  You may be asked to open and close your hand slowly and continually during the entire collection.  After the specified amount of blood has been removed from your body, the collection bag and tubing will be clamped.  The needle will be removed from your vein.  Pressure will be held on the site of the needle insertion to stop the bleeding.  A bandage (dressing) will be placed over the needle insertion site. The procedure may vary among health care providers and hospitals. What happens after the procedure?  Your recovery will be assessed and monitored.  You can return to your normal activities as directed by your health care provider. This information is not intended to replace advice given to you by your health care provider. Make sure you discuss any questions you have with your health care provider. Document Released: 11/22/2010 Document Revised: 02/20/2016 Document Reviewed: 06/16/2014 Elsevier Interactive Patient Education  2017 Elsevier Inc.  

## 2016-07-03 ENCOUNTER — Encounter: Payer: Self-pay | Admitting: Hematology & Oncology

## 2016-07-13 ENCOUNTER — Other Ambulatory Visit: Payer: Self-pay | Admitting: Family

## 2016-07-13 ENCOUNTER — Encounter: Payer: Self-pay | Admitting: Family

## 2016-07-14 ENCOUNTER — Encounter: Payer: Self-pay | Admitting: Hematology & Oncology

## 2016-07-26 ENCOUNTER — Other Ambulatory Visit: Payer: Self-pay | Admitting: Family Medicine

## 2016-07-26 DIAGNOSIS — N181 Chronic kidney disease, stage 1: Secondary | ICD-10-CM

## 2016-07-29 ENCOUNTER — Other Ambulatory Visit: Payer: Self-pay | Admitting: Family Medicine

## 2016-07-29 ENCOUNTER — Other Ambulatory Visit: Payer: Self-pay | Admitting: Family

## 2016-07-29 ENCOUNTER — Telehealth (HOSPITAL_COMMUNITY): Payer: Self-pay | Admitting: Family Medicine

## 2016-07-29 ENCOUNTER — Ambulatory Visit
Admission: RE | Admit: 2016-07-29 | Discharge: 2016-07-29 | Disposition: A | Discharge status: Home / Self Care | Payer: 59 | Source: Ambulatory Visit | Attending: Family Medicine | Admitting: Family Medicine

## 2016-07-29 DIAGNOSIS — N181 Chronic kidney disease, stage 1: Secondary | ICD-10-CM

## 2016-07-29 DIAGNOSIS — D751 Secondary polycythemia: Secondary | ICD-10-CM

## 2016-08-01 NOTE — Telephone Encounter (Signed)
07/29/16 Left Message - Called pt and lmsg for her to CB to get scheduled for an echo    By Verdene Rio

## 2016-08-05 ENCOUNTER — Other Ambulatory Visit: Payer: Self-pay | Admitting: Internal Medicine

## 2016-08-05 DIAGNOSIS — J45991 Cough variant asthma: Secondary | ICD-10-CM

## 2016-08-11 ENCOUNTER — Ambulatory Visit (HOSPITAL_COMMUNITY): Payer: 59 | Attending: Cardiology

## 2016-08-11 ENCOUNTER — Other Ambulatory Visit: Payer: Self-pay

## 2016-08-11 DIAGNOSIS — D751 Secondary polycythemia: Secondary | ICD-10-CM

## 2016-08-11 DIAGNOSIS — I5030 Unspecified diastolic (congestive) heart failure: Secondary | ICD-10-CM | POA: Diagnosis not present

## 2016-08-12 ENCOUNTER — Encounter: Payer: Self-pay | Admitting: Internal Medicine

## 2016-08-12 ENCOUNTER — Ambulatory Visit (INDEPENDENT_AMBULATORY_CARE_PROVIDER_SITE_OTHER): Payer: 59 | Admitting: Internal Medicine

## 2016-08-12 VITALS — BP 134/74 | HR 58 | Ht 59.75 in | Wt 157.4 lb

## 2016-08-12 DIAGNOSIS — J45991 Cough variant asthma: Secondary | ICD-10-CM

## 2016-08-12 MED ORDER — CETIRIZINE HCL 10 MG PO CAPS: ORAL_CAPSULE | ORAL | Status: AC

## 2016-08-12 MED ORDER — GABAPENTIN 300 MG PO CAPS: 300.0000 mg | ORAL_CAPSULE | Freq: Three times a day (TID) | ORAL | 5 refills | Status: DC

## 2016-08-12 NOTE — Patient Instructions (Addendum)
Ok to try zyrtec at bedtime for drainage instead of chlorpheniramine   Stop singulair   Change gabapentin to 300 mg three times a day   Please schedule a follow up visit in 3 months but call sooner if needed

## 2016-08-12 NOTE — Progress Notes (Signed)
Subjective:    Patient ID: JOSETT NICHOL, female    DOB: 1950-12-30    MRN: [id number]    Brief patient profile:  16 yowf never smoker ? Born prematurely with "bronchitis at birth" and poor ex tolerance always last to finish any physical activity in groups as long as she can remember but able to do recumbent bike since summer 2016 and in meantime in Feb 2016 developed a daytime and hs cough pattern that  persisted despite symbicort 160 which did not really improve ex tol so referred to pulmonary clinic 09/15/2015 by Dr Dema Severin for further pulmonary eval.       History of Present Illness  09/15/2015  1st [facility] office visit/ Mauriana Dann   Chief Complaint  Patient presents with  . Pulmonary Consult    Referred by Dr. [name]. Pt c/o SOB and cough x 1 yr. Cough has been worse for the past month. Cough is non prod and worse when she lies down and when she talks alot. She gets winded walking up and incline or doing housework such as making her bed.   doe chronic / daily   = MMRC1 = can walk nl pace, flat grade, can't hurry or go uphills or steps s sob  And so far no better on symbicort though hfa technique not ideal (see a/p) and on ppi but not sure of dose, taking pc rec Omeprazole should be 40 mg Take 30-60 min before first meal of the day and take pepcid ac 20 mg one hour before bed with zyrtec  GERD   Continue symbicort 160 Take 2 puffs first thing in am and then another 2 puffs about 12 hours later (take pm dose at least 15 min before your exercise and if not better after 2 weeks stop it Work on inhaler technique:   For cough > tessilon 200 mg up to every 8 hours as needed (won't make you sleepy)    10/23/2015  f/u ov/Tomio Kirk re: cough since Feb 2016 on  ppi ac / pepcid 20 mg not on tessilon  Chief Complaint  Patient presents with  . Follow-up    Breathing is unchanged. She states she is coughing less. No new co's today.   still constant sense of something stuck in throat and  needing to clear it rec Omeprazole continue  40 mg Take 30-60 min before first meal of the day and take pepcid ac 20 mg one hour before bed   Stop zyrtec and For drainage / throat tickle try take CHLORPHENIRAMINE  4 mg - take one every 4 hours as needed - available over the counter- may cause drowsiness so start with just a bedtime dose or two and see how you tolerate it before trying in daytime   Continue symbicort 80 Take 1-2 puffs first thing in am and then another 2 puffs about 12 hours later (take pm dose at least 15 min before your exercise and if not better after 2 weeks stop it Work on inhaler technique:  relax and gently blow all the way out then take a nice smooth deep breath back in, triggering the inhaler at same time you start breathing in.  Hold for up to 5 seconds if you can. Blow out thru nose. Rinse and gargle with water when done For cough > tessilon 200 mg up to every 6 hours as needed (won't make you sleepy and goal is to eliminate all coughing and all  throat clearing  11/24/2015  f/u ov/Angell Pincock re: cough since feb 2016 on symb 80 2bid and max gerd rx  Chief Complaint  Patient presents with  . Follow-up    pt c/o sob w/activity, cont non prod cough, occ chest heaviness.  did not try the h1 daytime at all  But overall better    Kouffman Reflux v Neurogenic Cough Differentiator Reflux Comments  Do you awaken from a sound sleep coughing violently?                            With trouble breathing? gone    Do you have choking episodes when you cannot  Get enough air, gasping for air ?              gone   Do you usually cough when you lie down into  The bed, or when you just lie down to rest ?                          Better ,min   Do you usually cough after meals or eating?         no   Do you cough when (or after) you bend over?    no   GERD SCORE     Kouffman Reflux v Neurogenic Cough Differentiator Neurogenic   Do you more-or-less cough all day long? No   Does change  of temperature make you cough? Some still   Does laughing or chuckling cause you to cough? occ   Do fumes (perfume, automobile fumes, burned  Toast, etc.,) cause you to cough ?      no   Does speaking, singing, or talking on the phone cause you to cough   ?               Yes talking    Neurogenic/Airway score     rec Try off symbicort and on the chlorpheniramine during the day and if still coughing after memorial day the next step is: Add gabapentin 100 mg three times daily x 2 weeks then call if not better at that point - if better stay on it for 6 weeks and return her to simplify your treatment plan    01/27/2016  f/u ov/Jerilynn Feldmeier re: cough since Feb 2016  Chief Complaint  Patient presents with  . Follow-up    Cough has improved some, but not completely resolved. She also c/o PND.    on avg using h1 bid at most  Gabapentin 100 mg tid  Min noct cough  Breathing better off inhalers and Not limited by breathing from desired activities   rec Automatic = pepcid 20 mg at bedtime and gabapentin is 100 mg four times daily  For drainage / throat tickle try take CHLORPHENIRAMINE  4 mg - take one every 4 hours as needed -    03/14/2016  f/u ov/Sorren Vallier re: cough since Feb 2016  Chief Complaint  Patient presents with  . Follow-up    Pt reports that the cough is much improved - still not resolved but seems to be getting better over time. Still taking all meds as directed.   taking pepcid 20 mg qhs / chlorpheniramine daily still needed for tickle despite on gabapentin 100 qid flonase for nose no better in past/ no better sense of drainage home vs mountains and helps a lot more than clariton.  Sleeping fine now with or without the  chlorpheniramine rec  Add singulair 10 mg each pm to present medications For drainage / throat tickle try take CHLORPHENIRAMINE  4 mg - take one every 4 hours as needed  Allergy testing IgE 335 > Pos for Cats > dogs/ grass / ragweed    05/06/2016  f/u ov/Jovee Dettinger re: cough present  since Feb 2016 singulair/ gabapentin / pepcid at hs - Dog in bedroom Chief Complaint  Patient presents with  . Follow-up    6wk rov. pt states breathing is doing well. c/o non prod cough but has improved since last ov.  Not limited by breathing from desired activities  / has not tried chlorpheniramine rec flonase one puff in each nostril at bedtime and avoid close contact with dogs  Call me if not satisfied and I will be happy to refer you to a good allergist  For drainage / throat tickle try take CHLORPHENIRAMINE  4 mg - take one every 4 hours as needed - available over the counter- may cause drowsiness so start with just a bedtime dose or two and see how you tolerate it before trying in daytime     08/12/2016  f/u ov/Mayia Megill re:   Cough since Feb 2016 maint on singulair / gabapentin/ pepcid 20 mg daily / dog still in bedroom Chief Complaint  Patient presents with  . Follow-up    f/u asthma, sick last week and cough got bad but it is better this week, pt reports she is doing well  symptoms are sporadic day > noct and more dry than wet  - chlorpheniramine seems to help but can't take in daytime/ has never tried zyrtec / not clear singulair helping at all  Not limited by breathing from desired activities    No obvious day to day or daytime variability or assoc sob  or cp or chest tightness, subjective wheeze or overt sinus or hb symptoms. No unusual exp hx or h/o childhood pna/ asthma or knowledge of premature birth.  Sleeping ok without nocturnal  or early am exacerbation  of respiratory  c/o's or need for noct saba. Also denies any obvious fluctuation of symptoms with weather or environmental changes or other aggravating or alleviating factors except as outlined above   Current Medications, Allergies, Complete Past Medical History, Past Surgical History, Family History, and Social History were reviewed in [employer] record.  ROS  The following are not active complaints  unless bolded sore throat, dysphagia, dental problems, itching, sneezing,  nasal congestion or excess/ purulent secretions, ear ache,   fever, chills, sweats, unintended wt loss, classically pleuritic or exertional cp, hemoptysis,  orthopnea pnd or leg swelling, presyncope, palpitations, abdominal pain, anorexia, nausea, vomiting, diarrhea  or change in bowel or bladder habits, change in stools or urine, dysuria,hematuria,  rash, arthralgias, visual complaints, headache, numbness, weakness or ataxia or problems with walking or coordination,  change in mood/affect or memory.                         Objective:   Physical Exam  amb wf nad    08/12/2016      157  05/06/2016    161     11/24/2015        161  10/23/15 161 lb 12.8 oz (73.392 kg)  10/08/15 161 lb (73.029 kg)  09/15/15 162 lb (73.483 kg)    Vital signs reviewed  - Note on arrival 02 sats  99% on RA  HEENT: nl  dentition, turbinates, and oropharynx s cobblestoning or excess pnd. Nl external ear canals without cough reflex   NECK :  without JVD/Nodes/TM/ nl carotid upstrokes bilaterally   LUNGS: no acc muscle use,  Nl contour chest which is clear to A and P bilaterally without cough on insp or exp maneuvers   CV:  RRR  no s3 or murmur or increase in P2, no edema   ABD:  soft and nontender with nl inspiratory excursion in the supine position. No bruits or organomegaly, bowel sounds nl  MS:  Nl gait/ ext warm without deformities, calf tenderness, cyanosis or clubbing No obvious joint restrictions   SKIN: warm and dry without lesions                 Assessment & Plan:

## 2016-08-13 NOTE — Assessment & Plan Note (Signed)
10/23/2015  extensive coaching HFA effectiveness =    75% > try change symbicort to 80 2bid FENO 10/23/2015  = 11 - Spirometry 10/23/2015  wnl including fef 25-75 p symbicort with reported ongoing doe - 10/23/2015  Walked RA x 3 laps @ 185 ft each stopped due to  Sob/cough s desat at very fast  pace  - 11/24/2015 trial off symbicort due to uacs features  - 12/24/2015 called to report gabapentin 100 helps but only for 6 hours > rec qid dosing and f/u ov to titrate further> did not understand qid at ov 01/27/2016 > rechallnenge with qid and increase h1 as tol/needed  - Allergy profile 03/14/2016 >  Eos 0.2 /  IgE  335 RAST pos Cat > dog, grass, ragweed  - singulair trial 03/14/2016 > improved 05/06/2016 despite dog still in bedroom, wakes up with nasal congestion > rec avoid dog in bedroom, try otc flonase  - 08/12/2016  rec increase gabapentin to 300 tid and stop singulair   At this point clearly has pnds/irritable larynx syndrome components  but not asthma and really not clear singulair helping so rec max gabapentin but change to zyrtec so gets 24 h h1 benefit s the drowsiness issue from chlorpheniramine and if the cough then flares off singulair add it back  I had an extended discussion with the patient reviewing all relevant studies completed to date and  lasting 15 to 20 minutes of a 25 minute visit    Each maintenance medication was reviewed in detail including most importantly the difference between maintenance and prns and under what circumstances the prns are to be triggered using an action plan format that is not reflected in the computer generated alphabetically organized AVS.    Please see AVS for specific instructions unique to this visit that I personally wrote and verbalized to the the pt in detail and then reviewed with pt  by my nurse highlighting any  changes in therapy recommended at today's visit to their plan of care.

## 2016-08-15 ENCOUNTER — Encounter (HOSPITAL_COMMUNITY): Payer: Self-pay

## 2016-09-06 ENCOUNTER — Telehealth: Payer: Self-pay

## 2016-09-06 NOTE — Telephone Encounter (Signed)
Lab results received via fax from patient. Reviewed by [name], NP.  Pam notified by phone that ferritin is at an ideal level. No new orders at this time. Keep appt as scheduled on 3/22.  Pt verbalizes understanding. Labs sent to be scanned in. dph

## 2016-09-07 ENCOUNTER — Encounter: Payer: Self-pay | Admitting: Hematology & Oncology

## 2016-09-22 ENCOUNTER — Encounter: Payer: Self-pay | Admitting: Hematology & Oncology

## 2016-09-22 ENCOUNTER — Ambulatory Visit (HOSPITAL_BASED_OUTPATIENT_CLINIC_OR_DEPARTMENT_OTHER): Payer: 59 | Admitting: Hematology & Oncology

## 2016-09-22 ENCOUNTER — Other Ambulatory Visit (HOSPITAL_BASED_OUTPATIENT_CLINIC_OR_DEPARTMENT_OTHER): Payer: 59

## 2016-09-22 DIAGNOSIS — R1011 Right upper quadrant pain: Secondary | ICD-10-CM

## 2016-09-22 LAB — COMPREHENSIVE METABOLIC PANEL
ALK PHOS: 85 U/L (ref 40–150)
ALT: 35 U/L (ref 0–55)
ANION GAP: 10 meq/L (ref 3–11)
AST: 30 U/L (ref 5–34)
Albumin: 3.9 g/dL (ref 3.5–5.0)
BILIRUBIN TOTAL: 0.6 mg/dL (ref 0.20–1.20)
BUN: 22.2 mg/dL (ref 7.0–26.0)
CALCIUM: 9.3 mg/dL (ref 8.4–10.4)
CO2: 25 meq/L (ref 22–29)
CREATININE: 1.2 mg/dL — AB (ref 0.6–1.1)
Chloride: 107 mEq/L (ref 98–109)
EGFR: 47 mL/min/{1.73_m2} — AB (ref >90)
Glucose: 85 mg/dl (ref 70–140)
Potassium: 4.1 mEq/L (ref 3.5–5.1)
Sodium: 142 mEq/L (ref 136–145)
TOTAL PROTEIN: 6.9 g/dL (ref 6.4–8.3)

## 2016-09-22 LAB — CBC WITH DIFFERENTIAL (CANCER CENTER ONLY)
BASO#: 0.1 10*3/uL (ref 0.0–0.2)
BASO%: 0.8 % (ref 0.0–2.0)
EOS%: 7.3 % — AB (ref 0.0–7.0)
Eosinophils Absolute: 0.4 10*3/uL (ref 0.0–0.5)
HCT: 47.4 % — ABNORMAL HIGH (ref 34.8–46.6)
HGB: 16.1 g/dL — ABNORMAL HIGH (ref 11.6–15.9)
LYMPH#: 2.1 10*3/uL (ref 0.9–3.3)
LYMPH%: 36.3 % (ref 14.0–48.0)
MCH: 31 pg (ref 26.0–34.0)
MCHC: 34 g/dL (ref 32.0–36.0)
MCV: 91 fL (ref 81–101)
MONO#: 0.6 10*3/uL (ref 0.1–0.9)
MONO%: 10 % (ref 0.0–13.0)
NEUT#: 2.7 10*3/uL (ref 1.5–6.5)
NEUT%: 45.6 % (ref 39.6–80.0)
PLATELETS: 194 10*3/uL (ref 145–400)
RBC: 5.2 10*6/uL (ref 3.70–5.32)
RDW: 13.4 % (ref 11.1–15.7)
WBC: 5.9 10*3/uL (ref 3.9–10.0)

## 2016-09-22 LAB — FERRITIN: FERRITIN: 21 ng/mL (ref 9–269)

## 2016-09-22 LAB — IRON AND TIBC
%SAT: 28 % (ref 21–57)
IRON: 123 ug/dL (ref 41–142)
TIBC: 436 ug/dL (ref 236–444)
UIBC: 314 ug/dL (ref 120–384)

## 2016-09-22 NOTE — Progress Notes (Signed)
Hematology and Oncology Follow Up Visit  Shelby Harrington [id number] 1951-05-30 66 y.o. 09/22/2016   Principle Diagnosis:  Hemochromatosis - Heterozygous H63D mutation  Current Therapy:   Aspirin 81 mg PO daily  Phlebotomy to keep ferritin < 100 and %sat <50%    Interim History:  Shelby Harrington is here today for follow-up. She was phlebotomized back in December. Her iron saturation was 62%. I want to make sure we keep this below 50%.  She is going to go camping over [place] weekend. She is again [place] to this.  She is still working. She is quite busy at work. She's complaining of some pain in the right upper quadrant of the belly. This is been going on for a few months. It is intermittent. It appears to radiate around to her back. There is no associated nausea or vomiting. She's had no change in bowel or bladder habits. She's had no fever. Is not related to eating. We probably should get an ultrasound of the abdomen to evaluate this.  She's had no leg swelling. She's had no rashes.  She's had no cough or shortness of breath.   Medications:  Allergies as of 09/22/2016      Reactions   Codeine    Latex    Penicillins    Prednisone Palpitations      Medication List       Accurate as of 09/22/16  8:20 AM. Always use your most recent med list.          aspirin 81 MG tablet Take 81 mg by mouth daily.   CALCIUM PO Take 1 capsule by mouth daily.   CENTRUM SILVER PO Take 1 tablet by mouth daily.   Cetirizine HCl 10 MG Caps Commonly known as:  ZYRTEC ALLERGY One at bedtime   cyclobenzaprine 10 MG tablet Commonly known as:  FLEXERIL Take 10 mg by mouth at bedtime as needed for muscle spasms.   famotidine 20 MG tablet Commonly known as:  PEPCID Take 20 mg by mouth at bedtime.   fluticasone 50 MCG/ACT nasal spray Commonly known as:  FLONASE daily.   gabapentin 300 MG capsule Commonly known as:  NEURONTIN Take 1 capsule (300 mg total) by mouth 3 (three) times daily.   GLUCOSAMINE CHONDR COMPLEX PO Take 1 capsule by mouth daily.   metoprolol succinate 50 MG 24 hr tablet Commonly known as:  TOPROL-XL Take 50 mg by mouth daily.   SM VITAMIN D3 4000 units Caps Generic drug:  Cholecalciferol Take 1 capsule by mouth daily.       Allergies:  Allergies  Allergen Reactions  . Codeine   . Latex   . Penicillins   . Prednisone Palpitations    Past Medical History, Surgical history, Social history, and Family History were reviewed and updated.  Review of Systems: All other 10 point review of systems is negative.   Physical Exam:  weight is 161 lb (73 kg). Her oral temperature is 98.1 F (36.7 C). Her blood pressure is 144/82 (abnormal) and her pulse is 54 (abnormal). Her respiration is 17.   Wt Readings from Last 3 Encounters:  09/22/16 161 lb (73 kg)  08/12/16 157 lb 6.4 oz (71.4 kg)  06/21/16 161 lb (73 kg)    Well-developed well-nourished white female in no obvious distress. Head and neck exam shows no ocular or oral lesions. There are no palpable cervical or supraclavicular lymph nodes. Lungs are clear. Cardiac exam regular rate and rhythm with no murmurs, rubs or  bruits. Abdomen is soft. She has good bowel sounds. There is some slight tenderness in the right quadrant. She has no fluid wave. There is no palpable hepatomegaly. Extremities shows no clubbing, cyanosis or edema. Skin exam shows no rashes, ecchymoses or petechia. Neurological exam shows no focal neurological deficits. Lab Results  Component Value Date   WBC 5.9 09/22/2016   HGB 16.1 (H) 09/22/2016   HCT 47.4 (H) 09/22/2016   MCV 91 09/22/2016   PLT 194 09/22/2016   Lab Results  Component Value Date   FERRITIN 46 06/21/2016   IRON 228 (H) 06/21/2016   TIBC 370 06/21/2016   UIBC 142 06/21/2016   IRONPCTSAT 62 (H) 06/21/2016   Lab Results  Component Value Date   RBC 5.20 09/22/2016   No results found for: KPAFRELGTCHN, LAMBDASER, KAPLAMBRATIO No results found for:  IGGSERUM, IGA, IGMSERUM No results found for: Odetta Pink, SPEI   Chemistry      Component Value Date/Time   NA 143 06/21/2016 0812   K 3.6 06/21/2016 0812   CL 103 05/19/2016 1604   CO2 29 06/21/2016 0812   BUN 23.6 06/21/2016 0812   CREATININE 1.1 06/21/2016 0812      Component Value Date/Time   CALCIUM 9.2 06/21/2016 0812   ALKPHOS 84 06/21/2016 0812   AST 38 (H) 06/21/2016 0812   ALT 44 06/21/2016 0812   BILITOT 0.75 06/21/2016 0812     Impression and Plan: Shelby Harrington is a 66 yo white female with hemochromatosis - heterozygous for the H63D mutation.   We will see her iron studies show. I will like to think that her iron saturation will be of low 50%. I would suspect that her ferritin is below 100.   We will get the ultrasound of the abdomen in about a week. We'll see what this shows. She may have some gallbladder issues. I don't think this is from her liver itself.   I will like to see her back in 3 months.   [name], MD 3/22/20188:20 AM

## 2016-09-23 ENCOUNTER — Telehealth: Payer: Self-pay | Admitting: *Deleted

## 2016-09-23 NOTE — Telephone Encounter (Addendum)
Patient is aware of results.   ----- Message from [name], MD sent at 09/22/2016 12:44 PM EDT ----- Call - iron level is fantastic!!  NO need for phlebotomy!!!  Have fun camping!!  Shelby Harrington

## 2016-09-29 ENCOUNTER — Ambulatory Visit (HOSPITAL_BASED_OUTPATIENT_CLINIC_OR_DEPARTMENT_OTHER)
Admission: RE | Admit: 2016-09-29 | Discharge: 2016-09-29 | Disposition: A | Discharge status: Home / Self Care | Payer: 59 | Source: Ambulatory Visit | Attending: Hematology & Oncology | Admitting: Hematology & Oncology

## 2016-09-29 DIAGNOSIS — N281 Cyst of kidney, acquired: Secondary | ICD-10-CM | POA: Diagnosis not present

## 2016-09-29 DIAGNOSIS — R932 Abnormal findings on diagnostic imaging of liver and biliary tract: Secondary | ICD-10-CM | POA: Insufficient documentation

## 2016-09-29 DIAGNOSIS — K7689 Other specified diseases of liver: Secondary | ICD-10-CM | POA: Diagnosis not present

## 2016-10-05 ENCOUNTER — Encounter: Payer: Self-pay | Admitting: Hematology & Oncology

## 2016-10-07 ENCOUNTER — Encounter: Payer: Self-pay | Admitting: Hematology & Oncology

## 2016-11-08 ENCOUNTER — Encounter: Payer: Self-pay | Admitting: Family

## 2016-11-10 ENCOUNTER — Other Ambulatory Visit (HOSPITAL_COMMUNITY): Payer: Self-pay | Admitting: Family Medicine

## 2016-11-10 DIAGNOSIS — R160 Hepatomegaly, not elsewhere classified: Secondary | ICD-10-CM

## 2016-11-11 ENCOUNTER — Encounter: Payer: Self-pay | Admitting: Internal Medicine

## 2016-11-11 ENCOUNTER — Ambulatory Visit (INDEPENDENT_AMBULATORY_CARE_PROVIDER_SITE_OTHER): Payer: 59 | Admitting: Internal Medicine

## 2016-11-11 VITALS — BP 116/74 | HR 58 | Ht 59.75 in | Wt 164.0 lb

## 2016-11-11 DIAGNOSIS — J45991 Cough variant asthma: Secondary | ICD-10-CM

## 2016-11-11 NOTE — Patient Instructions (Signed)
Try gabapentin 300 mg twice daily x 2 weeks then just 300 mg daily    If cough flares as you taper the gabapentn go back to the higher dose x one week, start prilosec otc Take 30-60 min before first meal of the day x one week, then resume taper of the gabapentin    Please schedule a follow up visit in 3 months but call sooner if needed

## 2016-11-11 NOTE — Progress Notes (Signed)
Subjective:    Patient ID: Shelby Harrington, female    DOB: 06-Jun-1951    MRN: [id number]    Brief patient profile:  [id number] never smoker ? Born prematurely with "bronchitis at birth" and poor ex tolerance always last to finish any physical activity in groups as long as she can remember but able to do recumbent bike since summer 2016 and in meantime in Feb 2016 developed a daytime and hs cough pattern that  persisted despite symbicort 160 which did not really improve ex tol so referred to pulmonary clinic 09/15/2015 by Dr Dema Severin for further pulmonary eval for cough most c/w irritable larynx syndrome and best response to gabapentin      History of Present Illness  09/15/2015  1st [facility] office visit/ Misti Towle   Chief Complaint  Patient presents with  . Pulmonary Consult    Referred by Dr. [name]. Pt c/o SOB and cough x 1 yr. Cough has been worse for the past month. Cough is non prod and worse when she lies down and when she talks alot. She gets winded walking up and incline or doing housework such as making her bed.   doe chronic / daily   = MMRC1 = can walk nl pace, flat grade, can't hurry or go uphills or steps s sob  And so far no better on symbicort though hfa technique not ideal (see a/p) and on ppi but not sure of dose, taking pc rec Omeprazole should be 40 mg Take 30-60 min before first meal of the day and take pepcid ac 20 mg one hour before bed with zyrtec  GERD   Continue symbicort 160 Take 2 puffs first thing in am and then another 2 puffs about 12 hours later (take pm dose at least 15 min before your exercise and if not better after 2 weeks stop it Work on inhaler technique:   For cough > tessilon 200 mg up to every 8 hours as needed (won't make you sleepy)    10/23/2015  f/u ov/Jermiah Soderman re: cough since Feb 2016 on  ppi ac / pepcid 20 mg not on tessilon  Chief Complaint  Patient presents with  . Follow-up    Breathing is unchanged. She states she is coughing less.  No new co's today.   still constant sense of something stuck in throat and needing to clear it rec Omeprazole continue  40 mg Take 30-60 min before first meal of the day and take pepcid ac 20 mg one hour before bed   Stop zyrtec and For drainage / throat tickle try take CHLORPHENIRAMINE  4 mg - take one every 4 hours as needed - available over the counter- may cause drowsiness so start with just a bedtime dose or two and see how you tolerate it before trying in daytime   Continue symbicort 80 Take 1-2 puffs first thing in am and then another 2 puffs about 12 hours later (take pm dose at least 15 min before your exercise and if not better after 2 weeks stop it Work on inhaler technique:  relax and gently blow all the way out then take a nice smooth deep breath back in, triggering the inhaler at same time you start breathing in.  Hold for up to 5 seconds if you can. Blow out thru nose. Rinse and gargle with water when done For cough > tessilon 200 mg up to every 6 hours as needed (won't make you sleepy and goal is to eliminate  all coughing and all  throat clearing    11/24/2015  f/u ov/Nhyira Leano re: cough since feb 2016 on symb 80 2bid and max gerd rx  Chief Complaint  Patient presents with  . Follow-up    pt c/o sob w/activity, cont non prod cough, occ chest heaviness.  did not try the h1 daytime at all  But overall better    Kouffman Reflux v Neurogenic Cough Differentiator Reflux Comments  Do you awaken from a sound sleep coughing violently?                            With trouble breathing? gone    Do you have choking episodes when you cannot  Get enough air, gasping for air ?              gone   Do you usually cough when you lie down into  The bed, or when you just lie down to rest ?                          Better ,min   Do you usually cough after meals or eating?         no   Do you cough when (or after) you bend over?    no   GERD SCORE     Kouffman Reflux v Neurogenic Cough  Differentiator Neurogenic   Do you more-or-less cough all day long? No   Does change of temperature make you cough? Some still   Does laughing or chuckling cause you to cough? occ   Do fumes (perfume, automobile fumes, burned  Toast, etc.,) cause you to cough ?      no   Does speaking, singing, or talking on the phone cause you to cough   ?               Yes talking    Neurogenic/Airway score     rec Try off symbicort and on the chlorpheniramine during the day and if still coughing after memorial day the next step is: Add gabapentin 100 mg three times daily x 2 weeks then call if not better at that point - if better stay on it for 6 weeks and return her to simplify your treatment plan    01/27/2016  f/u ov/Chantry Headen re: cough since Feb 2016  Chief Complaint  Patient presents with  . Follow-up    Cough has improved some, but not completely resolved. She also c/o PND.    on avg using h1 bid at most  Gabapentin 100 mg tid  Min noct cough  Breathing better off inhalers and Not limited by breathing from desired activities   rec Automatic = pepcid 20 mg at bedtime and gabapentin is 100 mg four times daily  For drainage / throat tickle try take CHLORPHENIRAMINE  4 mg - take one every 4 hours as needed -    03/14/2016  f/u ov/Abygayle Deltoro re: cough since Feb 2016  Chief Complaint  Patient presents with  . Follow-up    Pt reports that the cough is much improved - still not resolved but seems to be getting better over time. Still taking all meds as directed.   taking pepcid 20 mg qhs / chlorpheniramine daily still needed for tickle despite on gabapentin 100 qid flonase for nose no better in past/ no better sense of drainage home vs mountains and helps a lot more  than clariton.  Sleeping fine now with or without the chlorpheniramine rec  Add singulair 10 mg each pm to present medications For drainage / throat tickle try take CHLORPHENIRAMINE  4 mg - take one every 4 hours as needed  Allergy testing IgE  335 > Pos for Cats > dogs/ grass / ragweed    05/06/2016  f/u ov/British Moyd re: cough present since Feb 2016 singulair/ gabapentin / pepcid at hs - Dog in bedroom Chief Complaint  Patient presents with  . Follow-up    6wk rov. pt states breathing is doing well. c/o non prod cough but has improved since last ov.  Not limited by breathing from desired activities  / has not tried chlorpheniramine rec flonase one puff in each nostril at bedtime and avoid close contact with dogs  Call me if not satisfied and I will be happy to refer you to a good allergist  For drainage / throat tickle try take CHLORPHENIRAMINE  4 mg - take one every 4 hours as needed - available over the counter- may cause drowsiness so start with just a bedtime dose or two and see how you tolerate it before trying in daytime     08/12/2016  f/u ov/Yelena Metzer re:   Cough since Feb 2016 maint on singulair / gabapentin/ pepcid 20 mg daily / dog still in bedroom Chief Complaint  Patient presents with  . Follow-up    f/u asthma, sick last week and cough got bad but it is better this week, pt reports she is doing well  symptoms are sporadic day > noct and more dry than wet  - chlorpheniramine seems to help but can't take in daytime/ has never tried zyrtec / not clear singulair helping at all  Not limited by breathing from desired activities   rec Ok to try zyrtec at bedtime for drainage instead of chlorpheniramine  Stop singulair  Change gabapentin to 300 mg three times a day    . 11/11/2016  f/u ov/Fabiha Rougeau re: UACS best it's been since onset Feb 2016 on 300 mg tid gabapentin/h2 hs  Chief Complaint  Patient presents with  . Follow-up    Pt states her breathing is overall doing well. She has noticed some congestion which she relates to the pollen. She wants to discuss stopping gabapentin- wt gain, foggy headed, sleepiness and blurred vision.      Not limited by breathing from desired activities    No obvious day to day or daytime  variability or assoc excess/ purulent sputum or mucus plugs or hemoptysis or cp or chest tightness, subjective wheeze or overt sinus or hb symptoms. No unusual exp hx or h/o childhood pna/ asthma or knowledge of premature birth.  Sleeping ok without nocturnal  or early am exacerbation  of respiratory  c/o's or need for noct saba. Also denies any obvious fluctuation of symptoms with weather or environmental changes or other aggravating or alleviating factors except as outlined above   Current Medications, Allergies, Complete Past Medical History, Past Surgical History, Family History, and Social History were reviewed in [employer] record.  ROS  The following are not active complaints unless bolded sore throat, dysphagia, dental problems, itching, sneezing,  nasal congestion or excess/ purulent secretions, ear ache,   fever, chills, sweats, unintended wt loss, classically pleuritic or exertional cp,  orthopnea pnd or leg swelling, presyncope, palpitations, abdominal pain, anorexia, nausea, vomiting, diarrhea  or change in bowel or bladder habits, change in stools or urine, dysuria,hematuria,  rash, arthralgias, visual complaints, headache, numbness, weakness or ataxia or problems with walking or coordination,  change in mood/affect or memory.                      Objective:   Physical Exam  amb wf nad    11/11/2016      164  08/12/2016        157  05/06/2016       161     11/24/2015        161  10/23/15 161 lb 12.8 oz (73.392 kg)  10/08/15 161 lb (73.029 kg)  09/15/15 162 lb (73.483 kg)    Vital signs reviewed  - Note on arrival 02 sats  95% on RA  HEENT: nl dentition, turbinates, and oropharynx s cobblestoning or excess pnd. Nl external ear canals without cough reflex   NECK :  without JVD/Nodes/TM/ nl carotid upstrokes bilaterally   LUNGS: no acc muscle use,  Nl contour chest which is clear to A and P bilaterally without cough on insp or exp maneuvers   CV:   RRR  no s3 or murmur or increase in P2, no edema   ABD:  soft and nontender with nl inspiratory excursion in the supine position. No bruits or organomegaly, bowel sounds nl  MS:  Nl gait/ ext warm without deformities, calf tenderness, cyanosis or clubbing No obvious joint restrictions   SKIN: warm and dry without lesions                 Assessment & Plan:

## 2016-11-12 ENCOUNTER — Encounter: Payer: Self-pay | Admitting: Internal Medicine

## 2016-11-12 NOTE — Assessment & Plan Note (Signed)
Body mass index is 32.3 kg/m.  -  trending up  No results found for: TSH   Contributing to gerd risk/ doe/reviewed the need and the process to achieve and maintain neg calorie balance > defer f/u primary care including intermittently monitoring thyroid status

## 2016-11-12 NOTE — Assessment & Plan Note (Signed)
10/23/2015  extensive coaching HFA effectiveness =    75% > try change symbicort to 80 2bid FENO 10/23/2015  = 11 - Spirometry 10/23/2015  wnl including fef 25-75 p symbicort with reported ongoing doe - 10/23/2015  Walked RA x 3 laps @ 185 ft each stopped due to  Sob/cough s desat at very fast  pace  - 11/24/2015 trial off symbicort due to uacs features  - 12/24/2015 called to report gabapentin 100 helps but only for 6 hours > rec qid dosing and f/u ov to titrate further> did not understand qid at ov 01/27/2016 > rechallnenge with qid and increase h1 as tol/needed  - Allergy profile 03/14/2016 >  Eos 0.2 /  IgE  335 RAST pos Cat > dog, grass, ragweed  - singulair trial 03/14/2016 > improved 05/06/2016 despite dog still in bedroom, wakes up with nasal congestion > rec avoid dog in bedroom, try otc flonase  - 08/12/2016  rec increase gabapentin to 300 tid and stop singulair > resolved but foggy headed   - 11/11/2016 reduce gabapentin to 300 mg daily   I had an extended discussion with the patient reviewing all relevant studies completed to date and  lasting 15 to 20 minutes of a 25 minute visit on the following ongoing concerns:   1) as with many pts with chronic cough there are likely many triggers but combine here to destablize the upper airway leading to cough inducing cough cycle best interupted by gabapentin to date.  2) her rhinitis is probably partially allergic but best addressed on zyrtec /flonas which should continue  3) now that better will use the approach of the lowest dose that works is the best dose re : gabapentin as she is noting cns effects at 300 mg tid  4) in event of flare will need max rx for gerd to control secondary reflux and temporarily increase the gabapentin back to 300 tid   Each maintenance medication was reviewed in detail including most importantly the difference between maintenance and as needed and under what circumstances the prns are to be used.  Please see AVS for  specific  Instructions which are unique to this visit and I personally typed out  which were reviewed in detail in writing with the patient and a copy provided.

## 2016-11-24 ENCOUNTER — Ambulatory Visit (HOSPITAL_COMMUNITY): Admission: RE | Admit: 2016-11-24 | Payer: 59 | Source: Ambulatory Visit

## 2016-12-01 ENCOUNTER — Other Ambulatory Visit (HOSPITAL_COMMUNITY)
Admission: RE | Admit: 2016-12-01 | Discharge: 2016-12-01 | Disposition: A | Discharge status: Home / Self Care | Payer: 59 | Source: Ambulatory Visit | Attending: Family Medicine | Admitting: Family Medicine

## 2016-12-01 ENCOUNTER — Other Ambulatory Visit: Payer: Self-pay | Admitting: Family Medicine

## 2016-12-01 DIAGNOSIS — Z124 Encounter for screening for malignant neoplasm of cervix: Secondary | ICD-10-CM | POA: Diagnosis not present

## 2016-12-01 DIAGNOSIS — Z1231 Encounter for screening mammogram for malignant neoplasm of breast: Secondary | ICD-10-CM

## 2016-12-02 ENCOUNTER — Other Ambulatory Visit (HOSPITAL_COMMUNITY): Payer: Self-pay | Admitting: Family Medicine

## 2016-12-02 ENCOUNTER — Ambulatory Visit (HOSPITAL_COMMUNITY)
Admission: RE | Admit: 2016-12-02 | Discharge: 2016-12-02 | Disposition: A | Discharge status: Home / Self Care | Payer: 59 | Source: Ambulatory Visit | Attending: Family Medicine | Admitting: Family Medicine

## 2016-12-02 DIAGNOSIS — Q6101 Congenital single renal cyst: Secondary | ICD-10-CM | POA: Diagnosis not present

## 2016-12-02 DIAGNOSIS — R160 Hepatomegaly, not elsewhere classified: Secondary | ICD-10-CM | POA: Insufficient documentation

## 2016-12-02 DIAGNOSIS — K7689 Other specified diseases of liver: Secondary | ICD-10-CM | POA: Insufficient documentation

## 2016-12-02 MED ORDER — GADOBENATE DIMEGLUMINE 529 MG/ML IV SOLN
20.0000 mL | Freq: Once | INTRAVENOUS | Status: AC | PRN
  Administered 2016-12-02: 16 mL via INTRAVENOUS

## 2016-12-05 LAB — CYTOLOGY - PAP: DIAGNOSIS: NEGATIVE

## 2016-12-14 ENCOUNTER — Ambulatory Visit
Admission: RE | Admit: 2016-12-14 | Discharge: 2016-12-14 | Disposition: A | Discharge status: Home / Self Care | Payer: 59 | Source: Ambulatory Visit | Attending: Family Medicine | Admitting: Family Medicine

## 2016-12-14 DIAGNOSIS — Z1231 Encounter for screening mammogram for malignant neoplasm of breast: Secondary | ICD-10-CM

## 2016-12-28 ENCOUNTER — Other Ambulatory Visit (HOSPITAL_BASED_OUTPATIENT_CLINIC_OR_DEPARTMENT_OTHER): Payer: 59

## 2016-12-28 ENCOUNTER — Telehealth: Payer: Self-pay | Admitting: *Deleted

## 2016-12-28 ENCOUNTER — Ambulatory Visit (HOSPITAL_BASED_OUTPATIENT_CLINIC_OR_DEPARTMENT_OTHER): Payer: 59 | Admitting: Family

## 2016-12-28 ENCOUNTER — Encounter: Payer: Self-pay | Admitting: *Deleted

## 2016-12-28 LAB — CBC WITH DIFFERENTIAL (CANCER CENTER ONLY)
BASO#: 0 10*3/uL (ref 0.0–0.2)
BASO%: 0.5 % (ref 0.0–2.0)
EOS%: 5.7 % (ref 0.0–7.0)
Eosinophils Absolute: 0.3 10*3/uL (ref 0.0–0.5)
HCT: 49 % — ABNORMAL HIGH (ref 34.8–46.6)
HEMOGLOBIN: 17 g/dL — AB (ref 11.6–15.9)
LYMPH#: 2.3 10*3/uL (ref 0.9–3.3)
LYMPH%: 41.3 % (ref 14.0–48.0)
MCH: 31.6 pg (ref 26.0–34.0)
MCHC: 34.7 g/dL (ref 32.0–36.0)
MCV: 91 fL (ref 81–101)
MONO#: 0.6 10*3/uL (ref 0.1–0.9)
MONO%: 10.7 % (ref 0.0–13.0)
NEUT%: 41.8 % (ref 39.6–80.0)
NEUTROS ABS: 2.3 10*3/uL (ref 1.5–6.5)
PLATELETS: 178 10*3/uL (ref 145–400)
RBC: 5.38 10*6/uL — AB (ref 3.70–5.32)
RDW: 13.2 % (ref 11.1–15.7)
WBC: 5.6 10*3/uL (ref 3.9–10.0)

## 2016-12-28 LAB — COMPREHENSIVE METABOLIC PANEL
ALBUMIN: 4.1 g/dL (ref 3.5–5.0)
ALK PHOS: 85 U/L (ref 40–150)
ALT: 30 U/L (ref 0–55)
AST: 30 U/L (ref 5–34)
Anion Gap: 13 mEq/L — ABNORMAL HIGH (ref 3–11)
BUN: 21.1 mg/dL (ref 7.0–26.0)
CO2: 25 meq/L (ref 22–29)
CREATININE: 1.1 mg/dL (ref 0.6–1.1)
Calcium: 9.5 mg/dL (ref 8.4–10.4)
Chloride: 106 mEq/L (ref 98–109)
EGFR: 54 mL/min/{1.73_m2} — ABNORMAL LOW (ref >90)
GLUCOSE: 98 mg/dL (ref 70–140)
Potassium: 4.6 mEq/L (ref 3.5–5.1)
SODIUM: 145 meq/L (ref 136–145)
TOTAL PROTEIN: 7.2 g/dL (ref 6.4–8.3)
Total Bilirubin: 0.42 mg/dL (ref 0.20–1.20)

## 2016-12-28 LAB — IRON AND TIBC
%SAT: 31 % (ref 21–57)
Iron: 121 ug/dL (ref 41–142)
TIBC: 388 ug/dL (ref 236–444)
UIBC: 267 ug/dL (ref 120–384)

## 2016-12-28 LAB — FERRITIN: Ferritin: 41 ng/ml (ref 9–269)

## 2016-12-28 NOTE — Progress Notes (Signed)
Hematology and Oncology Follow Up Visit  Shelby Harrington [id number] 09-Jul-1950 66 y.o. 12/28/2016   Principle Diagnosis:  Hemochromatosis - Heterozygous H63D mutation  Current Therapy:   Aspirin 81 mg PO daily  Phlebotomy to keep ferritin < 100 and iron sat < 50%    Interim History:  Shelby Harrington is here today for follow-up. She has had some intermittent fatigued.  Her last phlebotomy was in December. Iron saturation in March was 28% and ferritin 21.  She and her family decided to postpone their trip to [place] until spring 2019.  No fever, chills, n/v, cough, rash, SOB, chest pain, palpitations or changes in bowel or bladder habits. She has occasional right sided discomfort that comes and goes like a spasm. She had ab MRI with Dr. Dema Severin earlier this month which showed multiple hepatic cysts felt to benign.   She has occasional dizziness which she attributes to Neurontin and is currently weaning off with Dr. Melvyn Novas.  She verbalized that she is taking her baby aspirin daily. She has noticed that she does bruise easily on this. No episodes of bleeding, no petechiae.  No swelling, tenderness, numbness or tingling in her extremities. No c/o pain at this time.  She has maintained a good appetite and is staying well hydrated. Her weight is stable.   ECOG Performance Status: 1 - Symptomatic but completely ambulatory  Medications:  Allergies as of 12/28/2016      Reactions   Codeine    Latex    Penicillins    Prednisone Palpitations      Medication List       Accurate as of 12/28/16  8:44 AM. Always use your most recent med list.          aspirin 81 MG tablet Take 81 mg by mouth daily.   CALCIUM PO Take 1 capsule by mouth daily.   CENTRUM SILVER PO Take 1 tablet by mouth daily.   Cetirizine HCl 10 MG Caps Commonly known as:  ZYRTEC ALLERGY One at bedtime   cyclobenzaprine 10 MG tablet Commonly known as:  FLEXERIL Take 10 mg by mouth at bedtime as needed for muscle  spasms.   famotidine 20 MG tablet Commonly known as:  PEPCID Take 20 mg by mouth at bedtime.   fluticasone 50 MCG/ACT nasal spray Commonly known as:  FLONASE daily.   gabapentin 300 MG capsule Commonly known as:  NEURONTIN Take 1 capsule (300 mg total) by mouth 3 (three) times daily.   GLUCOSAMINE CHONDR COMPLEX PO Take 1 capsule by mouth daily.   metoprolol succinate 50 MG 24 hr tablet Commonly known as:  TOPROL-XL Take 50 mg by mouth daily.   SM VITAMIN D3 4000 units Caps Generic drug:  Cholecalciferol Take 1 capsule by mouth daily.       Allergies:  Allergies  Allergen Reactions  . Codeine   . Latex   . Penicillins   . Prednisone Palpitations    Past Medical History, Surgical history, Social history, and Family History were reviewed and updated.  Review of Systems: All other 10 point review of systems is negative.   Physical Exam:  vitals were not taken for this visit.  Wt Readings from Last 3 Encounters:  11/11/16 164 lb (74.4 kg)  09/22/16 161 lb (73 kg)  08/12/16 157 lb 6.4 oz (71.4 kg)    Ocular: Sclerae unicteric, pupils equal, round and reactive to light Ear-nose-throat: Oropharynx clear, dentition fair Lymphatic: No cervical, supraclavicular or axillary adenopathy Lungs no rales  or rhonchi, good excursion bilaterally Heart regular rate and rhythm, no murmur appreciated Abd soft, nontender, positive bowel sounds, no liver or spleen tip palpated on exam, no fluid wave MSK no focal spinal tenderness, no joint edema Neuro: non-focal, well-oriented, appropriate affect Breasts: Deferred   Lab Results  Component Value Date   WBC 5.6 12/28/2016   HGB 17.0 (H) 12/28/2016   HCT 49.0 (H) 12/28/2016   MCV 91 12/28/2016   PLT 178 12/28/2016   Lab Results  Component Value Date   FERRITIN 21 09/22/2016   IRON 123 09/22/2016   TIBC 436 09/22/2016   UIBC 314 09/22/2016   IRONPCTSAT 28 09/22/2016   Lab Results  Component Value Date   RBC 5.38 (H)  12/28/2016   No results found for: KPAFRELGTCHN, LAMBDASER, KAPLAMBRATIO No results found for: IGGSERUM, IGA, IGMSERUM No results found for: Ronnald Ramp, A1GS, Nelida Meuse, SPEI   Chemistry      Component Value Date/Time   NA 142 09/22/2016 0740   K 4.1 09/22/2016 0740   CL 103 05/19/2016 1604   CO2 25 09/22/2016 0740   BUN 22.2 09/22/2016 0740   CREATININE 1.2 (H) 09/22/2016 0740      Component Value Date/Time   CALCIUM 9.3 09/22/2016 0740   ALKPHOS 85 09/22/2016 0740   AST 30 09/22/2016 0740   ALT 35 09/22/2016 0740   BILITOT 0.60 09/22/2016 0740      Impression and Plan: Shelby Harrington is a very pleasant 66 yo caucasian female with hemochromatosis, heterozygous for the H63D mutation. She has responded nicely to phlebotomy in the past but would like to try getting replacement fluids after her next phlebotomy. We will see what her iron studies show and get one set up for later this week if needed.    We will go ahead and plan to see her back in 3 months for repeat lab work and follow-up.  She will contact our office with any questions or concerns. We can certainly see her sooner if need be.   Eliezer Bottom, NP 6/27/20188:44 AM

## 2016-12-28 NOTE — Telephone Encounter (Addendum)
Patient is aware of results  ----- Message from Eliezer Bottom, NP sent at 12/28/2016  1:06 PM EDT ----- Regarding: Iron  Iron studies look good! No phlebotomy needed at this time. Thank you!  Sarah  ----- Message ----- From: Interface, Lab In Three Zero One Sent: 12/28/2016   8:29 AM To: Eliezer Bottom, NP

## 2017-03-15 ENCOUNTER — Other Ambulatory Visit: Payer: Self-pay | Admitting: Family Medicine

## 2017-03-15 DIAGNOSIS — N281 Cyst of kidney, acquired: Secondary | ICD-10-CM

## 2017-03-23 ENCOUNTER — Ambulatory Visit
Admission: RE | Admit: 2017-03-23 | Discharge: 2017-03-23 | Disposition: A | Discharge status: Home / Self Care | Payer: 59 | Source: Ambulatory Visit | Attending: Family Medicine | Admitting: Family Medicine

## 2017-03-23 DIAGNOSIS — N281 Cyst of kidney, acquired: Secondary | ICD-10-CM

## 2017-03-30 ENCOUNTER — Ambulatory Visit (HOSPITAL_BASED_OUTPATIENT_CLINIC_OR_DEPARTMENT_OTHER): Payer: 59 | Admitting: Hematology & Oncology

## 2017-03-30 ENCOUNTER — Other Ambulatory Visit (HOSPITAL_BASED_OUTPATIENT_CLINIC_OR_DEPARTMENT_OTHER): Payer: 59

## 2017-03-30 LAB — CBC WITH DIFFERENTIAL (CANCER CENTER ONLY)
BASO#: 0 10*3/uL (ref 0.0–0.2)
BASO%: 0.6 % (ref 0.0–2.0)
EOS ABS: 0.3 10*3/uL (ref 0.0–0.5)
EOS%: 4 % (ref 0.0–7.0)
HEMATOCRIT: 48.5 % — AB (ref 34.8–46.6)
HEMOGLOBIN: 17.1 g/dL — AB (ref 11.6–15.9)
LYMPH#: 2.4 10*3/uL (ref 0.9–3.3)
LYMPH%: 36.2 % (ref 14.0–48.0)
MCH: 32.3 pg (ref 26.0–34.0)
MCHC: 35.3 g/dL (ref 32.0–36.0)
MCV: 92 fL (ref 81–101)
MONO#: 0.7 10*3/uL (ref 0.1–0.9)
MONO%: 11 % (ref 0.0–13.0)
NEUT%: 48.2 % (ref 39.6–80.0)
NEUTROS ABS: 3.2 10*3/uL (ref 1.5–6.5)
PLATELETS: 173 10*3/uL (ref 145–400)
RBC: 5.29 10*6/uL (ref 3.70–5.32)
RDW: 12.5 % (ref 11.1–15.7)
WBC: 6.6 10*3/uL (ref 3.9–10.0)

## 2017-03-30 LAB — IRON AND TIBC
%SAT: 51 % (ref 21–57)
Iron: 191 ug/dL — ABNORMAL HIGH (ref 41–142)
TIBC: 376 ug/dL (ref 236–444)
UIBC: 185 ug/dL (ref 120–384)

## 2017-03-30 LAB — COMPREHENSIVE METABOLIC PANEL
ALT: 26 U/L (ref 0–55)
AST: 29 U/L (ref 5–34)
Albumin: 4 g/dL (ref 3.5–5.0)
Alkaline Phosphatase: 73 U/L (ref 40–150)
Anion Gap: 8 mEq/L (ref 3–11)
BILIRUBIN TOTAL: 0.67 mg/dL (ref 0.20–1.20)
BUN: 18.2 mg/dL (ref 7.0–26.0)
CO2: 26 meq/L (ref 22–29)
CREATININE: 1 mg/dL (ref 0.6–1.1)
Calcium: 9.6 mg/dL (ref 8.4–10.4)
Chloride: 109 mEq/L (ref 98–109)
EGFR: 59 mL/min/{1.73_m2} — ABNORMAL LOW (ref >90)
GLUCOSE: 86 mg/dL (ref 70–140)
Potassium: 4 mEq/L (ref 3.5–5.1)
SODIUM: 143 meq/L (ref 136–145)
TOTAL PROTEIN: 7.2 g/dL (ref 6.4–8.3)

## 2017-03-30 LAB — FERRITIN: Ferritin: 65 ng/ml (ref 9–269)

## 2017-03-30 NOTE — Progress Notes (Signed)
Hematology and Oncology Follow Up Visit  Shelby Harrington [id number] 1951/05/31 66 y.o. 03/30/2017   Principle Diagnosis:  Hemochromatosis - Heterozygous H63D mutation  Current Therapy:   Aspirin 81 mg PO daily  Phlebotomy to keep ferritin < 50 and iron sat < 30%    Interim History:  Shelby Harrington is here today for follow-up. She feels a little bit tired. She has had a relatively uneventful summer. Her husband has been in the hospital with a hernia repair. He had a fairly significant abdominal hernia.  Back in June when she was last checked, her ferritin was 41 with an iron saturation of 31%.   She's had no problems with fever. There is no change in bowel or bladder habits. She's had no bleeding. She's had no rashes. She's had no leg swelling. She's had no headache.  Overall, her performance status is ECOG 0.  Medications:  Allergies as of 03/30/2017      Reactions   Codeine    Latex    Penicillins    Prednisone Palpitations      Medication List       Accurate as of 03/30/17  8:39 AM. Always use your most recent med list.          aspirin 81 MG tablet Take 81 mg by mouth daily.   CALCIUM PO Take 1 capsule by mouth daily.   CENTRUM SILVER PO Take 1 tablet by mouth daily.   Cetirizine HCl 10 MG Caps Commonly known as:  ZYRTEC ALLERGY One at bedtime   cyclobenzaprine 10 MG tablet Commonly known as:  FLEXERIL Take 10 mg by mouth at bedtime as needed for muscle spasms.   famotidine 20 MG tablet Commonly known as:  PEPCID Take 20 mg by mouth at bedtime.   fluticasone 50 MCG/ACT nasal spray Commonly known as:  FLONASE daily.   GLUCOSAMINE CHONDR COMPLEX PO Take 1 capsule by mouth daily.   metoprolol succinate 50 MG 24 hr tablet Commonly known as:  TOPROL-XL Take 50 mg by mouth daily.   SM VITAMIN D3 4000 units Caps Generic drug:  Cholecalciferol Take 1 capsule by mouth daily.       Allergies:  Allergies  Allergen Reactions  . Codeine   . Latex    . Penicillins   . Prednisone Palpitations    Past Medical History, Surgical history, Social history, and Family History were reviewed and updated.  Review of Systems: As stated in the interim history   Physical Exam:  weight is 165 lb (74.8 kg). Her oral temperature is 98.3 F (36.8 C). Her blood pressure is 150/81 (abnormal) and her pulse is 60. Her respiration is 16 and oxygen saturation is 99%.   Wt Readings from Last 3 Encounters:  03/30/17 165 lb (74.8 kg)  12/28/16 163 lb (73.9 kg)  11/11/16 164 lb (74.4 kg)    We'll developed and well-nourished white female. Head and neck exam shows no ocular or oral lesions. She has no palpable cervical or supraclavicular lymph nodes. Lungs are clear bilaterally. Cardiac exam regular rate and rhythm with no murmurs, rubs or bruits. Abdomen is soft. She has good bowel sounds. There is no fluid wave. There is no palpable liver or spleen tip. Back exam shows no tenderness over the spine, ribs or hips. Extremities shows no clubbing, cyanosis or edema. Neurological exam is nonfocal. Skin exam shows no rashes, ecchymoses or petechia.    Lab Results  Component Value Date   WBC 6.6 03/30/2017  HGB 17.1 (H) 03/30/2017   HCT 48.5 (H) 03/30/2017   MCV 92 03/30/2017   PLT 173 03/30/2017   Lab Results  Component Value Date   FERRITIN 41 12/28/2016   IRON 121 12/28/2016   TIBC 388 12/28/2016   UIBC 267 12/28/2016   IRONPCTSAT 31 12/28/2016   Lab Results  Component Value Date   RBC 5.29 03/30/2017   No results found for: KPAFRELGTCHN, LAMBDASER, KAPLAMBRATIO No results found for: IGGSERUM, IGA, IGMSERUM No results found for: Odetta Pink, SPEI   Chemistry      Component Value Date/Time   NA 145 12/28/2016 0822   K 4.6 12/28/2016 0822   CL 103 05/19/2016 1604   CO2 25 12/28/2016 0822   BUN 21.1 12/28/2016 0822   CREATININE 1.1 12/28/2016 0822      Component Value Date/Time    CALCIUM 9.5 12/28/2016 0822   ALKPHOS 85 12/28/2016 0822   AST 30 12/28/2016 0822   ALT 30 12/28/2016 0822   BILITOT 0.42 12/28/2016 0822      Impression and Plan: Shelby Harrington is a very pleasant 66 yo caucasian female with hemochromatosis, heterozygous for the H63D mutation.   For right now, I think we have to phlebotomize her. I think both her ferritin and iron saturation are too high. She clearly can be phlebotomized. She is definitely not anemic.  I will like to see her back in 3 months. I think this would be reasonable.  I think with some of the newer guidelines for hemochromatosis, we may have to be a little bit more aggressive with phlebotomies.  [name], MD 9/27/20188:39 AM

## 2017-04-06 ENCOUNTER — Encounter: Payer: Self-pay | Admitting: Hematology & Oncology

## 2017-04-06 NOTE — Progress Notes (Unsigned)
Pt called and said to fax medical records to [place] for a claim dos 05/19/2016.  Records were faxed on 01/27/2017   Patient ID [id number]      QKSK SCANNED

## 2017-04-13 ENCOUNTER — Other Ambulatory Visit: Payer: Self-pay | Admitting: Family

## 2017-04-13 ENCOUNTER — Ambulatory Visit (HOSPITAL_BASED_OUTPATIENT_CLINIC_OR_DEPARTMENT_OTHER): Payer: 59

## 2017-04-13 MED ORDER — SODIUM CHLORIDE 0.9 % IV SOLN
500.0000 mL | Freq: Once | INTRAVENOUS | Status: AC
  Administered 2017-04-13: 500 mL via INTRAVENOUS

## 2017-04-13 NOTE — Progress Notes (Signed)
Shelby Harrington presents today for phlebotomy per MD orders. Phlebotomy procedure started at [place] and ended at 0847. 503 grams removed from lt [facility] by Hilario Quarry, RN. Patient observed for 30 minutes after procedure without any incident while IVF infused.  Patient tolerated procedure well. IV needle removed intact. dph

## 2017-04-13 NOTE — Patient Instructions (Signed)
Therapeutic Phlebotomy Therapeutic phlebotomy is the controlled removal of blood from a person's body for the purpose of treating a medical condition. The procedure is similar to donating blood. Usually, about a pint (470 mL, or 0.47L) of blood is removed. The average adult has 9-12 pints (4.3-5.7 L) of blood. Therapeutic phlebotomy may be used to treat the following medical conditions:  Hemochromatosis. This is a condition in which the blood contains too much iron.  Polycythemia vera. This is a condition in which the blood contains too many red blood cells.  Porphyria cutanea tarda. This is a disease in which an important part of hemoglobin is not made properly. It results in the buildup of abnormal amounts of porphyrins in the body.  Sickle cell disease. This is a condition in which the red blood cells form an abnormal crescent shape rather than a round shape.  Tell a health care provider about:  Any allergies you have.  All medicines you are taking, including vitamins, herbs, eye drops, creams, and over-the-counter medicines.  Any problems you or family members have had with anesthetic medicines.  Any blood disorders you have.  Any surgeries you have had.  Any medical conditions you have. What are the risks? Generally, this is a safe procedure. However, problems may occur, including:  Nausea or light-headedness.  Low blood pressure.  Soreness, bleeding, swelling, or bruising at the needle insertion site.  Infection.  What happens before the procedure?  Follow instructions from your health care provider about eating or drinking restrictions.  Ask your health care provider about changing or stopping your regular medicines. This is especially important if you are taking diabetes medicines or blood thinners.  Wear clothing with sleeves that can be raised above the elbow.  Plan to have someone take you home after the procedure.  You may have a blood sample taken. What  happens during the procedure?  A needle will be inserted into one of your veins.  Tubing and a collection bag will be attached to that needle.  Blood will flow through the needle and tubing into the collection bag.  You may be asked to open and close your hand slowly and continually during the entire collection.  After the specified amount of blood has been removed from your body, the collection bag and tubing will be clamped.  The needle will be removed from your vein.  Pressure will be held on the site of the needle insertion to stop the bleeding.  A bandage (dressing) will be placed over the needle insertion site. The procedure may vary among health care providers and hospitals. What happens after the procedure?  Your recovery will be assessed and monitored.  You can return to your normal activities as directed by your health care provider. This information is not intended to replace advice given to you by your health care provider. Make sure you discuss any questions you have with your health care provider. Document Released: 11/22/2010 Document Revised: 02/20/2016 Document Reviewed: 06/16/2014 Elsevier Interactive Patient Education  2018 Elsevier Inc.  

## 2017-06-29 ENCOUNTER — Ambulatory Visit (HOSPITAL_BASED_OUTPATIENT_CLINIC_OR_DEPARTMENT_OTHER): Payer: 59 | Admitting: Hematology & Oncology

## 2017-06-29 ENCOUNTER — Encounter: Payer: Self-pay | Admitting: Hematology & Oncology

## 2017-06-29 ENCOUNTER — Other Ambulatory Visit (HOSPITAL_BASED_OUTPATIENT_CLINIC_OR_DEPARTMENT_OTHER): Payer: 59

## 2017-06-29 ENCOUNTER — Other Ambulatory Visit: Payer: Self-pay

## 2017-06-29 DIAGNOSIS — Z7982 Long term (current) use of aspirin: Secondary | ICD-10-CM

## 2017-06-29 LAB — CMP (CANCER CENTER ONLY)
ALBUMIN: 3.6 g/dL (ref 3.3–5.5)
ALT(SGPT): 32 U/L (ref 10–47)
AST: 30 U/L (ref 11–38)
Alkaline Phosphatase: 78 U/L (ref 26–84)
BILIRUBIN TOTAL: 0.6 mg/dL (ref 0.20–1.60)
BUN: 21 mg/dL (ref 7–22)
CO2: 28 meq/L (ref 18–33)
CREATININE: 1.3 mg/dL — AB (ref 0.6–1.2)
Calcium: 9.5 mg/dL (ref 8.0–10.3)
Chloride: 105 mEq/L (ref 98–108)
Glucose, Bld: 100 mg/dL (ref 73–118)
Potassium: 3.8 mEq/L (ref 3.3–4.7)
SODIUM: 148 meq/L — AB (ref 128–145)
TOTAL PROTEIN: 6.6 g/dL (ref 6.4–8.1)

## 2017-06-29 LAB — CBC WITH DIFFERENTIAL (CANCER CENTER ONLY)
BASO#: 0 10*3/uL (ref 0.0–0.2)
BASO%: 0.6 % (ref 0.0–2.0)
EOS ABS: 0.3 10*3/uL (ref 0.0–0.5)
EOS%: 5.2 % (ref 0.0–7.0)
HEMATOCRIT: 45.3 % (ref 34.8–46.6)
HGB: 15.6 g/dL (ref 11.6–15.9)
LYMPH#: 2 10*3/uL (ref 0.9–3.3)
LYMPH%: 36.2 % (ref 14.0–48.0)
MCH: 31.3 pg (ref 26.0–34.0)
MCHC: 34.4 g/dL (ref 32.0–36.0)
MCV: 91 fL (ref 81–101)
MONO#: 0.7 10*3/uL (ref 0.1–0.9)
MONO%: 12.3 % (ref 0.0–13.0)
NEUT#: 2.5 10*3/uL (ref 1.5–6.5)
NEUT%: 45.7 % (ref 39.6–80.0)
PLATELETS: 190 10*3/uL (ref 145–400)
RBC: 4.98 10*6/uL (ref 3.70–5.32)
RDW: 12.7 % (ref 11.1–15.7)
WBC: 5.4 10*3/uL (ref 3.9–10.0)

## 2017-06-29 LAB — IRON AND TIBC
%SAT: 34 % (ref 21–57)
IRON: 130 ug/dL (ref 41–142)
TIBC: 383 ug/dL (ref 236–444)
UIBC: 253 ug/dL (ref 120–384)

## 2017-06-29 LAB — FERRITIN: Ferritin: 18 ng/ml (ref 9–269)

## 2017-06-29 NOTE — Progress Notes (Signed)
Hematology and Oncology Follow Up Visit  Shelby Harrington [id number] 03-Jan-1951 66 y.o. 06/29/2017   Principle Diagnosis:  Hemochromatosis - Heterozygous H63D mutation  Current Therapy:   Aspirin 81 mg PO daily  Phlebotomy to keep ferritin < 50 and iron sat < 30%    Interim History:  Shelby Harrington is here today for follow-up.  She is doing quite well.  She had a very nice Christmas.  We did phlebotomize her when she was here 3 months ago.  Her iron studies showed a ferritin of 65 with an iron saturation of 51%.  This was just too high for her.  She has had no problems outside of a inflamed eustachian tube.  She is on medication for this.  That she has had no fatigue.  She has had no chance for exercising.  She has been quite busy.  She has had no flu.  Parents been no cough or shortness of breath.  Overall, her performance status is ECOG 0.  Medications:  Allergies as of 06/29/2017      Reactions   Codeine    Latex    Penicillins    Prednisone Palpitations      Medication List        Accurate as of 06/29/17  8:56 AM. Always use your most recent med list.          aspirin 81 MG tablet Take 81 mg by mouth daily.   CALCIUM PO Take 1 capsule by mouth daily.   CENTRUM SILVER PO Take 1 tablet by mouth daily.   Cetirizine HCl 10 MG Caps Commonly known as:  ZYRTEC ALLERGY One at bedtime   cyclobenzaprine 10 MG tablet Commonly known as:  FLEXERIL Take 10 mg by mouth at bedtime as needed for muscle spasms.   estradiol 0.1 MG/GM vaginal cream Commonly known as:  ESTRACE Place 0.1 Applicatorfuls vaginally every morning.   famotidine 20 MG tablet Commonly known as:  PEPCID Take 20 mg by mouth at bedtime.   fluticasone 50 MCG/ACT nasal spray Commonly known as:  FLONASE daily.   GLUCOSAMINE CHONDR COMPLEX PO Take 1 capsule by mouth daily.   metoprolol succinate 50 MG 24 hr tablet Commonly known as:  TOPROL-XL Take 50 mg by mouth daily.   SM VITAMIN D3  4000 units Caps Generic drug:  Cholecalciferol Take 1 capsule by mouth daily.       Allergies:  Allergies  Allergen Reactions  . Codeine   . Latex   . Penicillins   . Prednisone Palpitations    Past Medical History, Surgical history, Social history, and Family History were reviewed and updated.  Review of Systems: Review of Systems  Constitutional: Negative.   HENT: Positive for congestion and ear pain.   Eyes: Negative.   Respiratory: Negative.   Cardiovascular: Negative.   Gastrointestinal: Negative.   Genitourinary: Negative.   Musculoskeletal: Negative.   Skin: Negative.   Neurological: Negative.   Endo/Heme/Allergies: Negative.   Psychiatric/Behavioral: Negative.     Physical Exam:  weight is 166 lb 1.9 oz (75.4 kg). Her oral temperature is 98.1 F (36.7 C). Her blood pressure is 158/83 (abnormal) and her pulse is 56 (abnormal). Her oxygen saturation is 99%.   Wt Readings from Last 3 Encounters:  06/29/17 166 lb 1.9 oz (75.4 kg)  03/30/17 165 lb (74.8 kg)  12/28/16 163 lb (73.9 kg)     Physical Exam  Constitutional: She is oriented to person, place, and time.  HENT:  Head: Normocephalic  and atraumatic.  Mouth/Throat: Oropharynx is clear and moist.  Eyes: EOM are normal. Pupils are equal, round, and reactive to light.  Neck: Normal range of motion.  Cardiovascular: Normal rate, regular rhythm and normal heart sounds.  Pulmonary/Chest: Effort normal and breath sounds normal.  Abdominal: Soft. Bowel sounds are normal.  Musculoskeletal: Normal range of motion. She exhibits no edema, tenderness or deformity.  Lymphadenopathy:    She has no cervical adenopathy.  Neurological: She is alert and oriented to person, place, and time.  Skin: Skin is warm and dry. No rash noted. No erythema.  Psychiatric: She has a normal mood and affect. Her behavior is normal. Judgment and thought content normal.  Vitals reviewed.    Lab Results  Component Value Date   WBC  5.4 06/29/2017   HGB 15.6 06/29/2017   HCT 45.3 06/29/2017   MCV 91 06/29/2017   PLT 190 06/29/2017   Lab Results  Component Value Date   FERRITIN 65 03/30/2017   IRON 191 (H) 03/30/2017   TIBC 376 03/30/2017   UIBC 185 03/30/2017   IRONPCTSAT 51 03/30/2017   Lab Results  Component Value Date   RBC 4.98 06/29/2017   No results found for: KPAFRELGTCHN, LAMBDASER, KAPLAMBRATIO No results found for: IGGSERUM, IGA, IGMSERUM No results found for: Kathrynn Ducking, MSPIKE, SPEI   Chemistry      Component Value Date/Time   NA 148 (H) 06/29/2017 0813   NA 143 03/30/2017 0805   K 3.8 06/29/2017 0813   K 4.0 03/30/2017 0805   CL 105 06/29/2017 0813   CO2 28 06/29/2017 0813   CO2 26 03/30/2017 0805   BUN 21 06/29/2017 0813   BUN 18.2 03/30/2017 0805   CREATININE 1.3 (H) 06/29/2017 0813   CREATININE 1.0 03/30/2017 0805      Component Value Date/Time   CALCIUM 9.5 06/29/2017 0813   CALCIUM 9.6 03/30/2017 0805   ALKPHOS 78 06/29/2017 0813   ALKPHOS 73 03/30/2017 0805   AST 30 06/29/2017 0813   AST 29 03/30/2017 0805   ALT 32 06/29/2017 0813   ALT 26 03/30/2017 0805   BILITOT 0.60 06/29/2017 0813   BILITOT 0.67 03/30/2017 0805      Impression and Plan: Shelby Harrington is a very pleasant 66 yo caucasian female with hemochromatosis, heterozygous for the H63D mutation.   We will see what her iron levels show.  Hopefully, we do not have to phlebotomize her.  I would like to think that we can get away without phlebotomizing her.  I will plan to get her back in 3 months again.  I think this is a good timeframe for follow-up.  I know she has had a very good year this year.  Hopefully will be a very quiet year next year.   [name], MD 12/27/20188:56 AM

## 2017-06-30 ENCOUNTER — Encounter: Payer: Self-pay | Admitting: *Deleted

## 2017-07-05 ENCOUNTER — Encounter: Payer: Self-pay | Admitting: Hematology & Oncology

## 2017-09-28 ENCOUNTER — Inpatient Hospital Stay: Payer: 59

## 2017-09-28 ENCOUNTER — Inpatient Hospital Stay: Payer: 59 | Attending: Family | Admitting: Family

## 2017-09-28 DIAGNOSIS — Z79899 Other long term (current) drug therapy: Secondary | ICD-10-CM | POA: Insufficient documentation

## 2017-09-28 DIAGNOSIS — R42 Dizziness and giddiness: Secondary | ICD-10-CM | POA: Diagnosis not present

## 2017-09-28 DIAGNOSIS — Z7982 Long term (current) use of aspirin: Secondary | ICD-10-CM | POA: Insufficient documentation

## 2017-09-28 DIAGNOSIS — R0602 Shortness of breath: Secondary | ICD-10-CM | POA: Insufficient documentation

## 2017-09-28 DIAGNOSIS — R5383 Other fatigue: Secondary | ICD-10-CM | POA: Insufficient documentation

## 2017-09-28 LAB — CBC WITH DIFFERENTIAL (CANCER CENTER ONLY)
BASOS ABS: 0.1 10*3/uL (ref 0.0–0.1)
BASOS PCT: 1 %
Eosinophils Absolute: 0.4 10*3/uL (ref 0.0–0.5)
Eosinophils Relative: 6 %
HEMATOCRIT: 48.8 % — AB (ref 34.8–46.6)
HEMOGLOBIN: 16.7 g/dL — AB (ref 11.6–15.9)
LYMPHS PCT: 36 %
Lymphs Abs: 2.2 10*3/uL (ref 0.9–3.3)
MCH: 31.5 pg (ref 26.0–34.0)
MCHC: 34.2 g/dL (ref 32.0–36.0)
MCV: 91.9 fL (ref 81.0–101.0)
MONO ABS: 0.7 10*3/uL (ref 0.1–0.9)
Monocytes Relative: 11 %
NEUTROS PCT: 46 %
Neutro Abs: 2.8 10*3/uL (ref 1.5–6.5)
Platelet Count: 187 10*3/uL (ref 145–400)
RBC: 5.31 MIL/uL (ref 3.70–5.32)
RDW: 13.8 % (ref 11.1–15.7)
WBC: 6.1 10*3/uL (ref 3.9–10.0)

## 2017-09-28 LAB — CMP (CANCER CENTER ONLY)
ALBUMIN: 4 g/dL (ref 3.5–5.0)
ALT: 29 U/L (ref 0–55)
AST: 28 U/L (ref 5–34)
Alkaline Phosphatase: 81 U/L (ref 40–150)
Anion gap: 9 (ref 3–11)
BILIRUBIN TOTAL: 0.6 mg/dL (ref 0.2–1.2)
BUN: 20 mg/dL (ref 7–26)
CHLORIDE: 108 mmol/L (ref 98–109)
CO2: 27 mmol/L (ref 22–29)
CREATININE: 1.1 mg/dL (ref 0.60–1.10)
Calcium: 9.1 mg/dL (ref 8.4–10.4)
GFR, Est AFR Am: >60 mL/min (ref >60)
GFR, Estimated: 52 mL/min — ABNORMAL LOW (ref >60)
GLUCOSE: 90 mg/dL (ref 70–140)
POTASSIUM: 4.4 mmol/L (ref 3.5–5.1)
Sodium: 144 mmol/L (ref 136–145)
TOTAL PROTEIN: 7.1 g/dL (ref 6.4–8.3)

## 2017-09-28 LAB — IRON AND TIBC
Iron: 153 ug/dL — ABNORMAL HIGH (ref 41–142)
Saturation Ratios: 39 % (ref 21–57)
TIBC: 393 ug/dL (ref 236–444)
UIBC: 240 ug/dL

## 2017-09-28 LAB — FERRITIN: Ferritin: 38 ng/mL (ref 9–269)

## 2017-09-28 NOTE — Progress Notes (Signed)
Hematology and Oncology Follow Up Visit  Shelby Harrington [id number] 01/03/1951 67 y.o. 09/28/2017   Principle Diagnosis:  Hemochromatosis - Heterozygous H63D mutation  Current Therapy:   Aspirin 81 mg PO daily  Phlebotomy to keep ferritin <50 and iron sat < 30%    Interim History:  Shelby Harrington is here today for follow-up. She is symptomatic with fatigue, increased joint aches and SOB with exertion.  Iron studies are pending. Her Hgb 16.7 , Hct 48.8. Her last phlebotomy was in October 2018.  She has occasional dizziness due to vertigo and will take dramamine as needed.  No fever, chills, n/v, cough, rash, SOB, chest pain, palpitations, abdominal pain or changes in bowel or bladder habits.  No episodes of bleeding, no bruising or petechiae. No lymphadenopathy noted on exam.  She has puffiness in her feet and ankles since starting gabapentin. No pitting edema, pedal pulses are +2.  She has numbness and tingling in her hands she states due to pre-carpal tunnel.  She has maintained a good appetite but states that she needs to better hydrate at home. Her weight is stable.   ECOG Performance Status: 1 - Symptomatic but completely ambulatory  Medications:  Allergies as of 09/28/2017      Reactions   Codeine    Latex    Penicillins    Prednisone Palpitations      Medication List        Accurate as of 09/28/17  9:16 AM. Always use your most recent med list.          aspirin 81 MG tablet Take 81 mg by mouth daily.   CALCIUM PO Take 1 capsule by mouth daily.   CENTRUM SILVER PO Take 1 tablet by mouth daily.   Cetirizine HCl 10 MG Caps Commonly known as:  ZYRTEC ALLERGY One at bedtime   cyclobenzaprine 10 MG tablet Commonly known as:  FLEXERIL Take 10 mg by mouth at bedtime as needed for muscle spasms.   estradiol 0.1 MG/GM vaginal cream Commonly known as:  ESTRACE Place 0.1 Applicatorfuls vaginally every morning.   famotidine 20 MG tablet Commonly known as:   PEPCID Take 20 mg by mouth at bedtime.   fluticasone 50 MCG/ACT nasal spray Commonly known as:  FLONASE daily.   GLUCOSAMINE CHONDR COMPLEX PO Take 1 capsule by mouth daily.   metoprolol succinate 50 MG 24 hr tablet Commonly known as:  TOPROL-XL Take 50 mg by mouth daily.   SM VITAMIN D3 4000 units Caps Generic drug:  Cholecalciferol Take 1 capsule by mouth daily.       Allergies:  Allergies  Allergen Reactions  . Codeine   . Latex   . Penicillins   . Prednisone Palpitations    Past Medical History, Surgical history, Social history, and Family History were reviewed and updated.  Review of Systems: All other 10 point review of systems is negative.   Physical Exam:  vitals were not taken for this visit.   Wt Readings from Last 3 Encounters:  06/29/17 166 lb 1.9 oz (75.4 kg)  03/30/17 165 lb (74.8 kg)  12/28/16 163 lb (73.9 kg)    Ocular: Sclerae unicteric, pupils equal, round and reactive to light Ear-nose-throat: Oropharynx clear, dentition fair Lymphatic: No cervical, supraclavicular or axillary adenopathy Lungs no rales or rhonchi, good excursion bilaterally Heart regular rate and rhythm, no murmur appreciated Abd soft, nontender, positive bowel sounds, no liver or spleen tip palpated on exam, no fluid wave  MSK no focal spinal  tenderness, no joint edema Neuro: non-focal, well-oriented, appropriate affect Breasts: Deferred   Lab Results  Component Value Date   WBC 6.1 09/28/2017   HGB 15.6 06/29/2017   HCT 48.8 (H) 09/28/2017   MCV 91.9 09/28/2017   PLT 187 09/28/2017   Lab Results  Component Value Date   FERRITIN 18 06/29/2017   IRON 130 06/29/2017   TIBC 383 06/29/2017   UIBC 253 06/29/2017   IRONPCTSAT 34 06/29/2017   Lab Results  Component Value Date   RBC 5.31 09/28/2017   No results found for: KPAFRELGTCHN, LAMBDASER, KAPLAMBRATIO No results found for: IGGSERUM, IGA, IGMSERUM No results found for: Kathrynn Ducking, MSPIKE, SPEI   Chemistry      Component Value Date/Time   NA 148 (H) 06/29/2017 0813   NA 143 03/30/2017 0805   K 3.8 06/29/2017 0813   K 4.0 03/30/2017 0805   CL 105 06/29/2017 0813   CO2 28 06/29/2017 0813   CO2 26 03/30/2017 0805   BUN 21 06/29/2017 0813   BUN 18.2 03/30/2017 0805   CREATININE 1.3 (H) 06/29/2017 0813   CREATININE 1.0 03/30/2017 0805      Component Value Date/Time   CALCIUM 9.5 06/29/2017 0813   CALCIUM 9.6 03/30/2017 0805   ALKPHOS 78 06/29/2017 0813   ALKPHOS 73 03/30/2017 0805   AST 30 06/29/2017 0813   AST 29 03/30/2017 0805   ALT 32 06/29/2017 0813   ALT 26 03/30/2017 0805   BILITOT 0.60 06/29/2017 0813   BILITOT 0.67 03/30/2017 0805      Impression and Plan: Shelby Harrington is a very pleasant 67 yo caucasian female with hemochromatosis, heterozygous for the H63D mutation. She is symptomatic with fatigue, SOB and increased joint pain.  We will see what her iron studies show and bring her back in for phlebotomy if needed.  We will plan to see her back in another 3 months for follow-up.  She will contact our office with any questions or concerns. We can certainly see her sooner if need be.   Laverna Peace, NP 3/28/20199:16 AM

## 2017-09-30 ENCOUNTER — Encounter: Payer: Self-pay | Admitting: Family

## 2017-10-05 ENCOUNTER — Other Ambulatory Visit: Payer: Self-pay

## 2017-10-05 ENCOUNTER — Inpatient Hospital Stay: Payer: 59 | Attending: Hematology & Oncology

## 2017-10-05 MED ORDER — SODIUM CHLORIDE 0.9 % IV SOLN
Freq: Once | INTRAVENOUS | Status: AC
  Administered 2017-10-05: 16:00:00 via INTRAVENOUS

## 2017-10-05 NOTE — Progress Notes (Signed)
Shelby Harrington presents today for phlebotomy per MD orders. Phlebotomy procedure started at 1550 and ended at 1556 via 35 ga angio cath to right ac.  Order received per S. [place] NP to phlebotomize 250 grams and replace with 250 ml.'s NS over 30 minutes.   250 grams removed, NS started at 500 ml/hr for 250 ml.'s.  Patient observed for 30 minutes after procedure without any incident. Patient tolerated procedure well.  Snack and drink taken.  IV needle removed intact.

## 2017-10-05 NOTE — Patient Instructions (Signed)

## 2017-11-10 ENCOUNTER — Encounter: Payer: Self-pay | Admitting: Family

## 2017-11-14 ENCOUNTER — Encounter: Payer: Self-pay | Admitting: Family

## 2017-12-14 ENCOUNTER — Other Ambulatory Visit: Payer: Self-pay | Admitting: Family Medicine

## 2017-12-14 DIAGNOSIS — Z1231 Encounter for screening mammogram for malignant neoplasm of breast: Secondary | ICD-10-CM

## 2017-12-24 IMAGING — US US RENAL
1 series · 14 of 25 positions shown · non-contrast
Comparison: MRI December 02, 2016

CLINICAL DATA: Renal cyst seen on recent MRI.

EXAM:
RENAL / URINARY TRACT ULTRASOUND COMPLETE

[Series 1: us renal · 0.19mm/px · 14 of 37 slices shown]
[im 1/37]
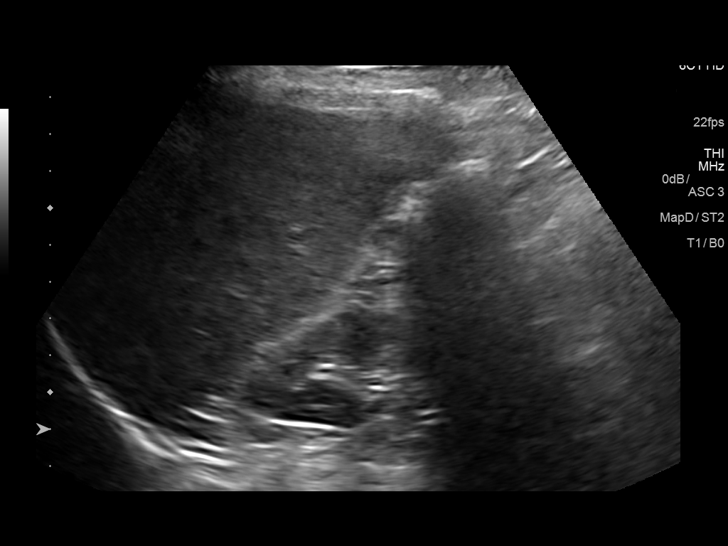
[im 4/37]
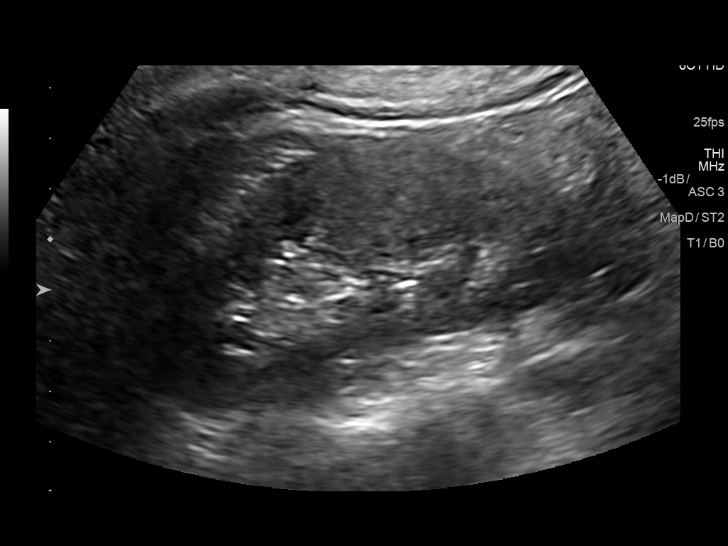
[im 7/37]
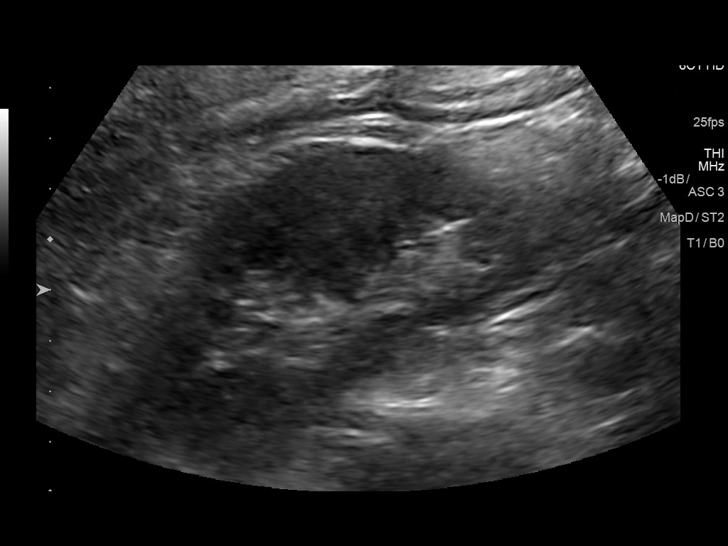
[im 10/37]
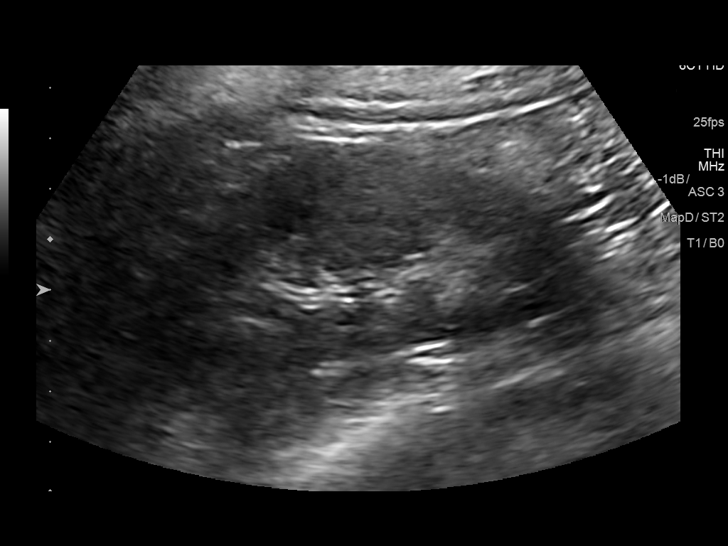
[im 13/37]
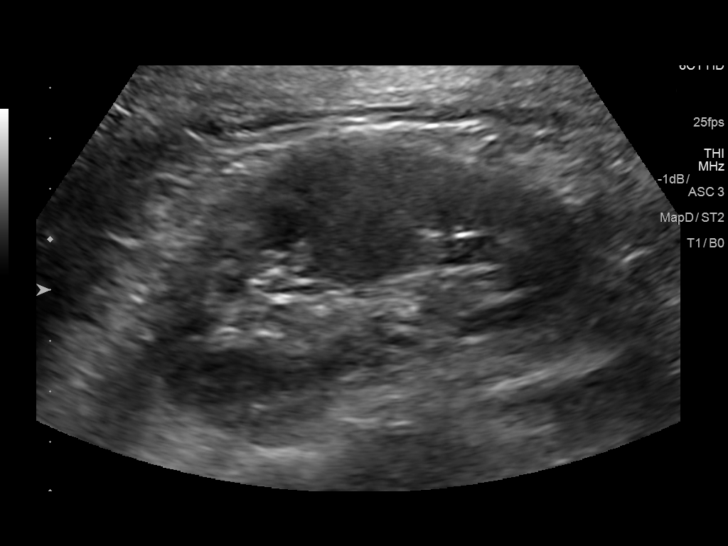
[im 14/37]
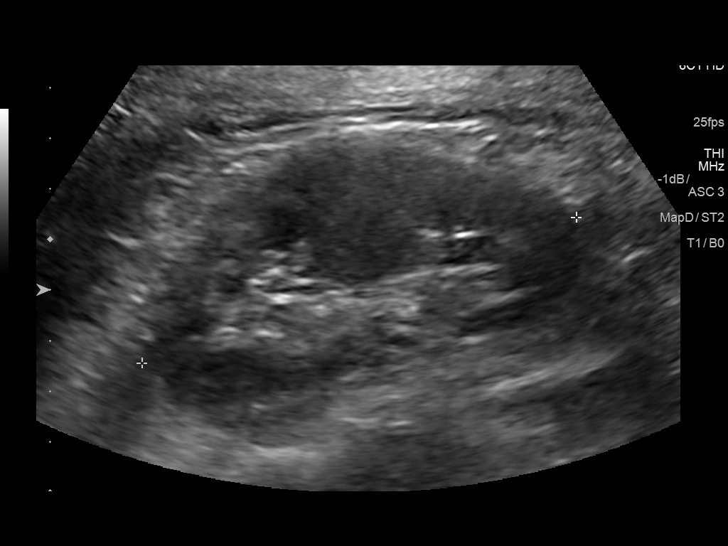
[im 17/37]
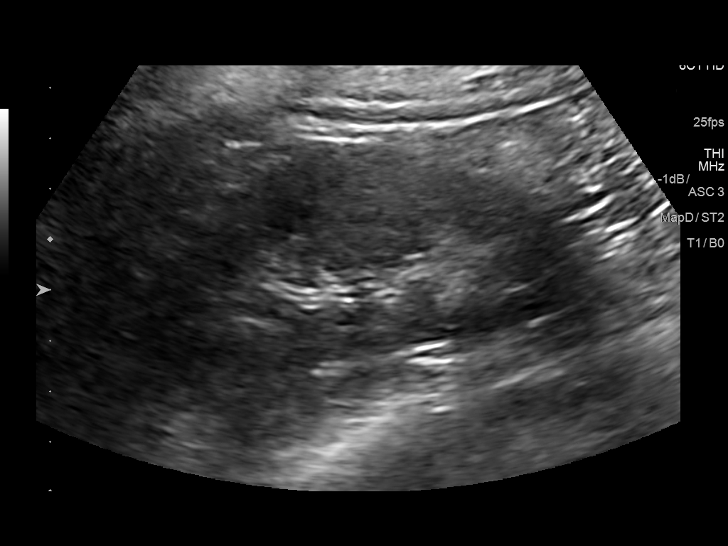
[im 20/37]
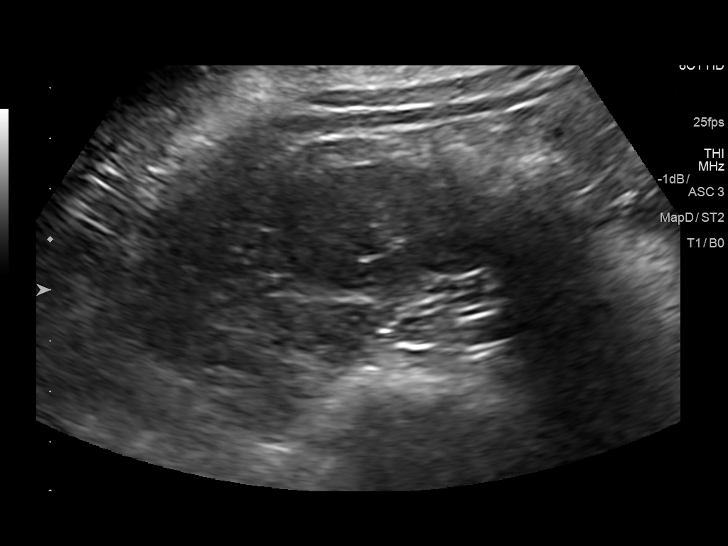
[im 23/37]
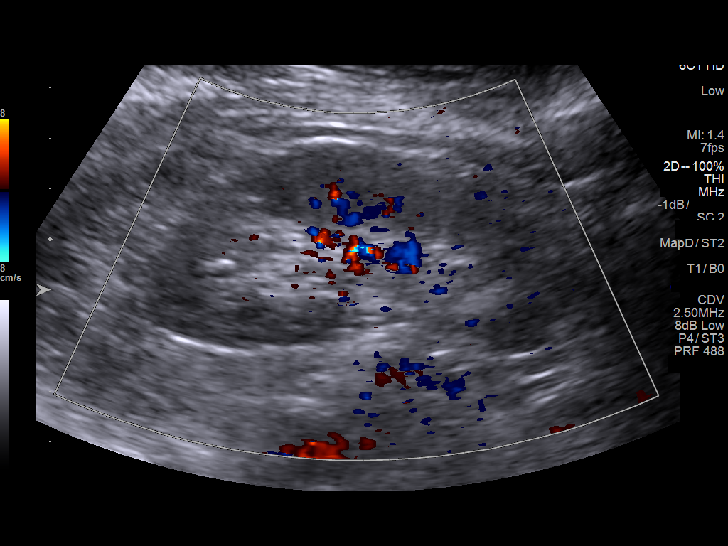
[im 25/37]
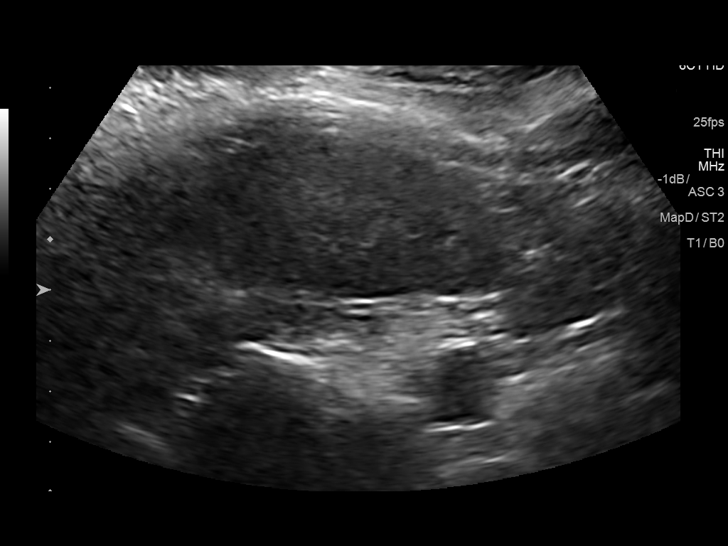
[im 28/37]
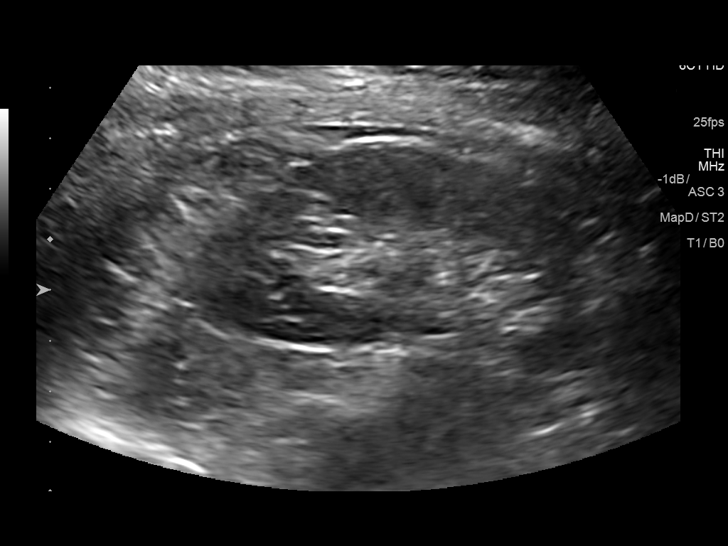
[im 31/37]
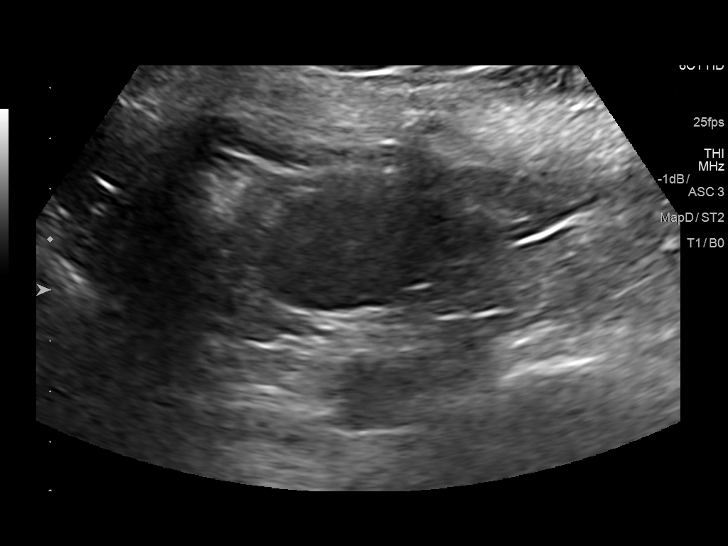
[im 34/37]
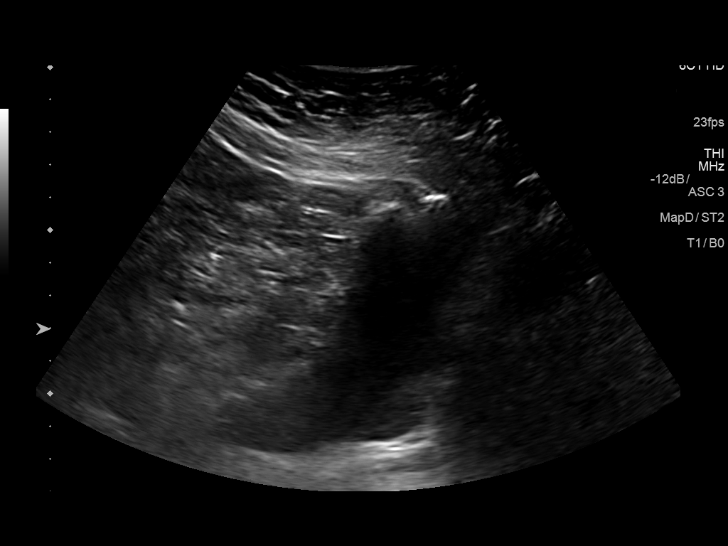
[im 37/37]
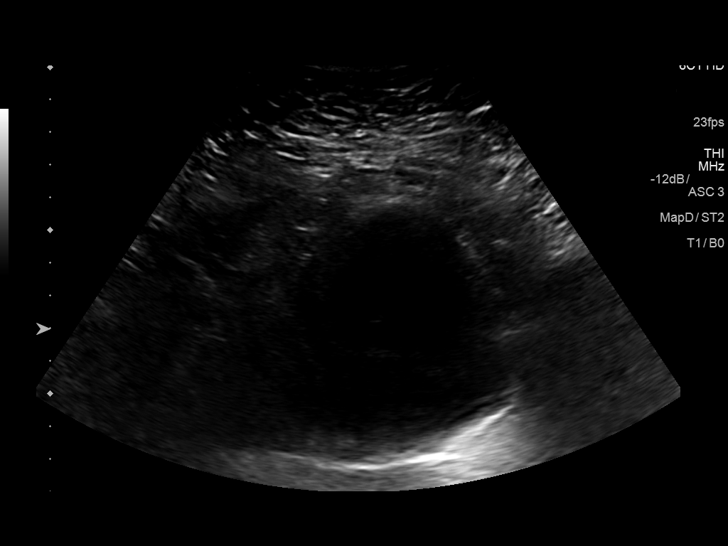

[14 of 25 positions shown; findings below may reference images not displayed]

FINDINGS: Right Kidney:

Length: 9.9 cm. A 9 mm hypoechoic lesion in the mid right kidney
correlates with the cyst seen on recent MRI. This is too small to
characterize with ultrasound however.

Left Kidney:

Length: 9.2 cm. Echogenicity within normal limits. No mass or
hydronephrosis visualized.

Bladder:

Appears normal for degree of bladder distention.
IMPRESSION: 1. The 9 mm hypoechoic lesion in the mid right kidney is too small
to characterize. This correlates with the cyst seen on recent MRI.

## 2018-01-11 ENCOUNTER — Other Ambulatory Visit: Payer: 59

## 2018-01-11 ENCOUNTER — Ambulatory Visit: Payer: 59 | Admitting: Family

## 2018-01-15 ENCOUNTER — Ambulatory Visit
Admission: RE | Admit: 2018-01-15 | Discharge: 2018-01-15 | Disposition: A | Discharge status: Home / Self Care | Payer: 59 | Source: Ambulatory Visit | Attending: Family Medicine | Admitting: Family Medicine

## 2018-01-15 DIAGNOSIS — Z1231 Encounter for screening mammogram for malignant neoplasm of breast: Secondary | ICD-10-CM

## 2018-01-24 ENCOUNTER — Inpatient Hospital Stay: Payer: 59 | Attending: Hematology & Oncology

## 2018-01-24 ENCOUNTER — Other Ambulatory Visit: Payer: Self-pay

## 2018-01-24 ENCOUNTER — Inpatient Hospital Stay (HOSPITAL_BASED_OUTPATIENT_CLINIC_OR_DEPARTMENT_OTHER): Payer: 59 | Admitting: Family

## 2018-01-24 DIAGNOSIS — Z7982 Long term (current) use of aspirin: Secondary | ICD-10-CM

## 2018-01-24 DIAGNOSIS — K219 Gastro-esophageal reflux disease without esophagitis: Secondary | ICD-10-CM | POA: Insufficient documentation

## 2018-01-24 DIAGNOSIS — Z79899 Other long term (current) drug therapy: Secondary | ICD-10-CM | POA: Diagnosis not present

## 2018-01-24 LAB — CBC WITH DIFFERENTIAL (CANCER CENTER ONLY)
Basophils Absolute: 0 10*3/uL (ref 0.0–0.1)
Basophils Relative: 1 %
Eosinophils Absolute: 0.2 10*3/uL (ref 0.0–0.5)
Eosinophils Relative: 4 %
HCT: 49 % — ABNORMAL HIGH (ref 34.8–46.6)
Hemoglobin: 17 g/dL — ABNORMAL HIGH (ref 11.6–15.9)
Lymphocytes Relative: 42 %
Lymphs Abs: 2.3 10*3/uL (ref 0.9–3.3)
MCH: 31.1 pg (ref 26.0–34.0)
MCHC: 34.7 g/dL (ref 32.0–36.0)
MCV: 89.7 fL (ref 81.0–101.0)
Monocytes Absolute: 0.6 10*3/uL (ref 0.1–0.9)
Monocytes Relative: 11 %
Neutro Abs: 2.2 10*3/uL (ref 1.5–6.5)
Neutrophils Relative %: 42 %
Platelet Count: 188 10*3/uL (ref 145–400)
RBC: 5.46 MIL/uL — ABNORMAL HIGH (ref 3.70–5.32)
RDW: 12.9 % (ref 11.1–15.7)
WBC Count: 5.3 10*3/uL (ref 3.9–10.0)

## 2018-01-24 LAB — CMP (CANCER CENTER ONLY)
ALT: 27 U/L (ref 0–44)
AST: 30 U/L (ref 15–41)
Albumin: 4.3 g/dL (ref 3.5–5.0)
Alkaline Phosphatase: 91 U/L (ref 38–126)
Anion gap: 12 (ref 5–15)
BUN: 15 mg/dL (ref 8–23)
CO2: 26 mmol/L (ref 22–32)
Calcium: 10 mg/dL (ref 8.9–10.3)
Chloride: 106 mmol/L (ref 98–111)
Creatinine: 1.19 mg/dL — ABNORMAL HIGH (ref 0.44–1.00)
GFR, Est AFR Am: 54 mL/min — ABNORMAL LOW (ref >60)
GFR, Estimated: 47 mL/min — ABNORMAL LOW (ref >60)
Glucose, Bld: 97 mg/dL (ref 70–99)
Potassium: 4.5 mmol/L (ref 3.5–5.1)
Sodium: 144 mmol/L (ref 135–145)
Total Bilirubin: 0.4 mg/dL (ref 0.3–1.2)
Total Protein: 7.2 g/dL (ref 6.5–8.1)

## 2018-01-24 LAB — FERRITIN: FERRITIN: 33 ng/mL (ref 11–307)

## 2018-01-24 LAB — IRON AND TIBC
Iron: 101 ug/dL (ref 41–142)
Saturation Ratios: 24 % (ref 21–57)
TIBC: 414 ug/dL (ref 236–444)
UIBC: 313 ug/dL

## 2018-01-24 NOTE — Progress Notes (Signed)
Hematology and Oncology Follow Up Visit  Shelby Harrington [id number] [birth year] 67 y.o. 01/24/2018   Principle Diagnosis:  Hemochromatosis - Heterozygous H63D mutation  Current Therapy:   Aspirin 81 mg PO daily  Phlebotomy to keep ferritin <50 and iron sat <30%    Interim History: Shelby Harrington is here today for follow-up. She is doing well but has noticed some fatigue and blurry vision. Hct is 49%. Iron studies are pending.  She fell 2 weeks ago while on vacation. She was walking her dog and it took off pulling her down. Her lower back is still sore.  No fever, chills, n/v, cough, rash, dizziness, SOB, chest pain, palpitations, abdominal pain or changes in bowel or bladder habits.  She has GERD with occasion mid sternal pain. She takes Pepcid as needed which helps.  No swelling or tenderness in her extremities. She has numbness and tingling in the fingertips of her right hand she feels may be due to carpal tunnel. She has a brace that she uses as needed.  She has a good appetite and is staying well hydrated. Her weight is stable.   ECOG Performance Status: 1 - Symptomatic but completely ambulatory  Medications:  Allergies as of 01/24/2018      Reactions   Codeine    Latex    Penicillins    Prednisone Palpitations      Medication List        Accurate as of 01/24/18  8:41 AM. Always use your most recent med list.          aspirin 81 MG tablet Take 81 mg by mouth daily.   CALCIUM PO Take 1 capsule by mouth daily.   CENTRUM SILVER PO Take 1 tablet by mouth daily.   Cetirizine HCl 10 MG Caps Commonly known as:  ZYRTEC ALLERGY One at bedtime   cyclobenzaprine 10 MG tablet Commonly known as:  FLEXERIL Take 10 mg by mouth at bedtime as needed for muscle spasms.   estradiol 0.1 MG/GM vaginal cream Commonly known as:  ESTRACE Place 0.1 Applicatorfuls vaginally every morning.   famotidine 20 MG tablet Commonly known as:  PEPCID Take 20 mg by mouth at bedtime.     fluticasone 50 MCG/ACT nasal spray Commonly known as:  FLONASE daily.   GLUCOSAMINE CHONDR COMPLEX PO Take 1 capsule by mouth daily.   metoprolol succinate 50 MG 24 hr tablet Commonly known as:  TOPROL-XL Take 50 mg by mouth daily.   SM VITAMIN D3 4000 units Caps Generic drug:  Cholecalciferol Take 1 capsule by mouth daily.       Allergies:  Allergies  Allergen Reactions  . Codeine   . Latex   . Penicillins   . Prednisone Palpitations    Past Medical History, Surgical history, Social history, and Family History were reviewed and updated.  Review of Systems: All other 10 point review of systems is negative.   Physical Exam:  vitals were not taken for this visit.   Wt Readings from Last 3 Encounters:  09/28/17 167 lb 4 oz (75.9 kg)  06/29/17 166 lb 1.9 oz (75.4 kg)  03/30/17 165 lb (74.8 kg)    Ocular: Sclerae unicteric, pupils equal, round and reactive to light Ear-nose-throat: Oropharynx clear, dentition fair Lymphatic: No cervical, supraclavicular or axillary adenopathy Lungs no rales or rhonchi, good excursion bilaterally Heart regular rate and rhythm, no murmur appreciated Abd soft, nontender, positive bowel sounds, no liver or spleen tip palpated on exam, no fluid wave MSK no  focal spinal tenderness, no joint edema Neuro: non-focal, well-oriented, appropriate affect Breasts: Deferred  Lab Results  Component Value Date   WBC 5.3 01/24/2018   HGB 17.0 (H) 01/24/2018   HCT 49.0 (H) 01/24/2018   MCV 89.7 01/24/2018   PLT 188 01/24/2018   Lab Results  Component Value Date   FERRITIN 38 09/28/2017   IRON 153 (H) 09/28/2017   TIBC 393 09/28/2017   UIBC 240 09/28/2017   IRONPCTSAT 39 09/28/2017   Lab Results  Component Value Date   RBC 5.46 (H) 01/24/2018   No results found for: KPAFRELGTCHN, LAMBDASER, KAPLAMBRATIO No results found for: IGGSERUM, IGA, IGMSERUM No results found for: Odetta Pink, SPEI   Chemistry      Component Value Date/Time   NA 144 09/28/2017 0900   NA 148 (H) 06/29/2017 0813   NA 143 03/30/2017 0805   K 4.4 09/28/2017 0900   K 3.8 06/29/2017 0813   K 4.0 03/30/2017 0805   CL 108 09/28/2017 0900   CL 105 06/29/2017 0813   CO2 27 09/28/2017 0900   CO2 28 06/29/2017 0813   CO2 26 03/30/2017 0805   BUN 20 09/28/2017 0900   BUN 21 06/29/2017 0813   BUN 18.2 03/30/2017 0805   CREATININE 1.10 09/28/2017 0900   CREATININE 1.3 (H) 06/29/2017 0813   CREATININE 1.0 03/30/2017 0805      Component Value Date/Time   CALCIUM 9.1 09/28/2017 0900   CALCIUM 9.5 06/29/2017 0813   CALCIUM 9.6 03/30/2017 0805   ALKPHOS 81 09/28/2017 0900   ALKPHOS 78 06/29/2017 0813   ALKPHOS 73 03/30/2017 0805   AST 28 09/28/2017 0900   AST 29 03/30/2017 0805   ALT 29 09/28/2017 0900   ALT 32 06/29/2017 0813   ALT 26 03/30/2017 0805   BILITOT 0.6 09/28/2017 0900   BILITOT 0.67 03/30/2017 0805      Impression and Plan: Shelby Harrington is a very pleasant 67 yo caucasian female with hemochromatosis, heterozygous for the H63D mutation. She is symptomatic as mentioned above.  She was unable to stay for phlebotomy today.  We will see what her iron studies show and get her scheduled for later this week or next week.  We will plan to see her back in another 4 months for follow-up.  She will contact our office with any questions or concerns. We can certainly see her sooner if need be.   Laverna Peace, NP 7/24/20198:41 AM

## 2018-02-02 ENCOUNTER — Encounter: Payer: Self-pay | Admitting: Family

## 2018-04-17 ENCOUNTER — Telehealth: Payer: Self-pay | Admitting: Hematology & Oncology

## 2018-04-17 NOTE — Telephone Encounter (Signed)
lom for pt to reutn call to office to resch her Dec appt to next week per sch msg

## 2018-04-19 ENCOUNTER — Encounter: Payer: Self-pay | Admitting: Family

## 2018-04-20 ENCOUNTER — Other Ambulatory Visit: Payer: Self-pay | Admitting: Family

## 2018-04-20 ENCOUNTER — Telehealth: Payer: Self-pay | Admitting: Hematology & Oncology

## 2018-04-20 NOTE — Telephone Encounter (Signed)
lmom for pt to return call to office to resch lab/ov to next week per sch msg

## 2018-04-26 ENCOUNTER — Inpatient Hospital Stay: Payer: 59 | Attending: Family

## 2018-04-26 MED ORDER — SODIUM CHLORIDE 0.9 % IV SOLN
Freq: Once | INTRAVENOUS | Status: AC
  Administered 2018-04-26: 15:00:00 via INTRAVENOUS
  Filled 2018-04-26: qty 250

## 2018-04-26 NOTE — Progress Notes (Signed)
Shelby Harrington presents today for phlebotomy per MD orders. Phlebotomy procedure started at 1510 and ended at 1525 via 61 ga angio cath to right ac.  515 grams removed. 500 ml  NS given over 30 minutes per md order. Patient observed for 30 minutes after procedure without any incident.  Snack and drink taken.  Patient tolerated procedure well. IV needle removed intact.

## 2018-04-26 NOTE — Patient Instructions (Signed)

## 2018-05-01 ENCOUNTER — Encounter: Payer: Self-pay | Admitting: Family Medicine

## 2018-05-01 ENCOUNTER — Ambulatory Visit (INDEPENDENT_AMBULATORY_CARE_PROVIDER_SITE_OTHER): Payer: 59 | Admitting: Family Medicine

## 2018-05-01 DIAGNOSIS — E669 Obesity, unspecified: Secondary | ICD-10-CM

## 2018-05-01 DIAGNOSIS — N183 Chronic kidney disease, stage 3 unspecified: Secondary | ICD-10-CM

## 2018-05-01 DIAGNOSIS — Z6832 Body mass index (BMI) 32.0-32.9, adult: Secondary | ICD-10-CM

## 2018-05-01 NOTE — Patient Instructions (Addendum)
-   When you see the nephrologist, ask him/her for a release of information form to sign so I can talk with their office.  Tell them your dietitian wants to know the physician's specific dietary recommendations.    Behavioral Goals: 1. Check your BP at home at least once a week.  If it is high, rest a few minutes, and check again.    - Also on days it is unusual, make a note on your documentation as to why you think it's unusual.   2. Get at least 30 min 3 X wk.  (Sign up for the [facility] the minutes of exercise you get each time on your calendar/planner.    - Finding time to exercise: Remember that learning to say no is an important part of self-care.   - Who might make a good exercise partner? 3. Get some source of vegetables every day (paying attn to potassium and sodium).  You may want to experiment with making your own salad dressing, omitting salt.  One cup of arugula has less potassium than romaine.    - You probably do not need to restrict most fruits and vegetables at this time.  Exceptions (foods to limit or avoid):  Dark green leafy veg's, banana, tropical fruits, avocado...  - Iron contents of foods: Plant sources of iron are less well absorbed than meat sources.  Even a small amount of meat (flesh foods) greatly enhances absorption of iron from plant foods.  This means if you know a food provides iron (e.g., spinach), it would better to eat that food without meat, using dairy or egg as your protein source.  (Beans can also be a protein source, but better for your kidneys will be small quantities of high-qualtiy animal proteins.  Plant iron is also absorbed best in the presence of vitamin C.  Iron absorption is inhibited by coffee and tea.    Resources you can trust: - You can find potassium content of foods on the Internet, but look for at least two sources for confirmation.   - Eatright.org - [employer].com - Uptodate.com - Kidney.org - If you have Qs, email  [email or web address].   Suggestion: Keep a food record for a week, writing down what you eat and drink, and how much each day.  Look up the nutritional contents of the foods you eat frequently.  Categorize foods as high or low in both iron and potassium (and phophorus, if you want to look).

## 2018-05-01 NOTE — Progress Notes (Signed)
Medical Nutrition Therapy PCP [name], MD Oncologist Burney Gauze, MD  Assessment:  Primary concerns today: Weight management and diet for CKD (Stage 4).  Pam is very interested in preserving renal function, and her dietary needs are complicated by hemochromatosis as well as (symptoms of) polycythemia.  Dr. Dema Severin has referred Pam to a nephrologist, but appt is not scheduled yet.  Pam also wants to lose some weight.    Pam works full-time at [employer].  She lives with her husband, who also works full-time.  He usually cooks dinner on Mondays and Tuesdays, and she covers the other days of the week.    Learning Readiness: Ready  Usual eating pattern: 3 meals and 2 snacks per day. Frequent foods and beverages: water, 1/2 c coffee w/ 2 pkts stevia & 2 tbsp whipping cream 2 X day, 1 glass white wine 2-3 X wk, 4 oz cot chs, fruit cup peaches, chx.   Avoided foods: spinach (d/t iron), tomatoes (usually), avocado (d/t K+).   Usual physical activity: none currently.  Interested in joining [employer] with husband.   Sleep: Estimates she gets 6-7 hrs/night, but feels she would do better with ~8 hrs.  Barrier to more sleep is not getting to bed at night (usually the only "down" time in her day).    BP has been running 146/85-90 on some days, but Pam hasn't been measuring much recently.    24-hr recall: (Up at 6:15 AM) B (7:15 AM)-   1 devilled egg, 1/2 c coffee, Stevia, 2 tbsp whipping cream, 2 small pc sourdough bread.  Snk (12:15)-   1 Belvita pkg (230 kcal), water L (1:30 PM)-  [place] [employer] bbq chx brst, water Snk ( PM)-  --- D (5:30 PM)-  6 oz halibut, 1 glass white wine, 1/2 c coffee Stevia, 2 tbsp whipping cream Snk (8:30)-  1/2 stevia-sweetened choc bar (170 kcal) Typical day? No.  More typical is cot chs or meat and fruit cup for lunch.  Only has choc ~3 X wk.  Eats veg's usually ~3 X wk.    Nutritional Diagnosis:  [place]-2.2 Altered nutrition-related laboratory As  related to CKD and hemochromatosis.  As evidenced by declining GFR and need for phlebotomy as Fe accumulates.  Handouts given during visit include:  After-Visit Summary (AVS)  Handouts on potassium and phosphorus food sources  Demonstrated degree of understanding via:  Teach Back  Barriers to learning/adherence to lifestyle change: Husband's disinterest in vegetables; sedentary living for several years.   Monitoring/Evaluation:  Dietary intake, exercise, BP, and body weight January 2020.

## 2018-06-16 ENCOUNTER — Encounter: Payer: Self-pay | Admitting: Family

## 2018-06-20 ENCOUNTER — Other Ambulatory Visit: Payer: Self-pay

## 2018-06-20 ENCOUNTER — Inpatient Hospital Stay: Payer: 59 | Attending: Family | Admitting: Family

## 2018-06-20 ENCOUNTER — Encounter: Payer: Self-pay | Admitting: Family

## 2018-06-20 ENCOUNTER — Inpatient Hospital Stay: Payer: 59

## 2018-06-20 DIAGNOSIS — K219 Gastro-esophageal reflux disease without esophagitis: Secondary | ICD-10-CM | POA: Diagnosis not present

## 2018-06-20 DIAGNOSIS — Z7982 Long term (current) use of aspirin: Secondary | ICD-10-CM | POA: Diagnosis not present

## 2018-06-20 DIAGNOSIS — R5383 Other fatigue: Secondary | ICD-10-CM | POA: Insufficient documentation

## 2018-06-20 DIAGNOSIS — I10 Essential (primary) hypertension: Secondary | ICD-10-CM | POA: Insufficient documentation

## 2018-06-20 DIAGNOSIS — Z79899 Other long term (current) drug therapy: Secondary | ICD-10-CM | POA: Diagnosis not present

## 2018-06-20 NOTE — Progress Notes (Signed)
Hematology and Oncology Follow Up Visit  Shelby Harrington [id number] May 27, 1951 67 y.o. 06/20/2018   Principle Diagnosis:  Hemochromatosis - Heterozygous H63D mutation  Current Therapy:   Aspirin 81 mg PO daily  Phlebotomy to keep ferritin <50 and iron sat <30%   Interim History:  Shelby Harrington is here today for follow-up. She is symptomatic with fatigue at this time.  Her ferritin last month was 19 and iron saturation 24%. Hct is > 47%.  She states that she saw her nephrologist earlier this week and that due to HTN and intermittent chest discomfort they have added Amlodipine to her medication regimen.  BP today is 155/86. She has GERD off and on and feels this may be due to the new medication.  No fever, chills, n/v, cough, rash, dizziness, SOB, palpitations, abdominal pain or changes in bowel or bladder habits.  She recently completed a Z-pack for an enflamed eustachian tube. She is still having some ear pan and will speak with her PCP about an ENT referral.  No swelling, tenderness, numbness or tingling in her extremities at this time. She will have positional numbness in her right hand due to possible carpal tunnel.  No lymphadenopathy noted on exam.  She has maintained a good appetite and is staying well hydrated. Her weight is stable.   ECOG Performance Status: 1 - Symptomatic but completely ambulatory  Medications:  Allergies as of 06/20/2018      Reactions   Codeine    Latex    Penicillins    Prednisone Palpitations      Medication List       Accurate as of June 20, 2018 10:58 AM. Always use your most recent med list.        amlodipine-atorvastatin 2.5-10 MG tablet Commonly known as:  CADUET Take 1 tablet by mouth daily.   aspirin 81 MG tablet Take 81 mg by mouth daily.   CALCIUM PO Take 1 capsule by mouth daily. 1200mg  daily   CENTRUM SILVER PO Take 1 tablet by mouth daily.   Cetirizine HCl 10 MG Caps Commonly known as:  ZYRTEC ALLERGY One at  bedtime   clotrimazole-betamethasone cream Commonly known as:  LOTRISONE Apply 1 application topically 2 (two) times daily.   cyclobenzaprine 10 MG tablet Commonly known as:  FLEXERIL Take 10 mg by mouth at bedtime as needed for muscle spasms.   famotidine 20 MG tablet Commonly known as:  PEPCID Take 20 mg by mouth at bedtime.   fluticasone 50 MCG/ACT nasal spray Commonly known as:  FLONASE daily.   GLUCOSAMINE CHONDR COMPLEX PO Take 1 capsule by mouth daily.   metoprolol succinate 50 MG 24 hr tablet Commonly known as:  TOPROL-XL Take 50 mg by mouth daily.   SM VITAMIN D3 100 MCG (4000 UT) Caps Generic drug:  Cholecalciferol Take 1 capsule by mouth daily.       Allergies:  Allergies  Allergen Reactions  . Codeine   . Latex   . Penicillins   . Prednisone Palpitations    Past Medical History, Surgical history, Social history, and Family History were reviewed and updated.  Review of Systems: All other 10 point review of systems is negative.   Physical Exam:  weight is 161 lb 1.9 oz (73.1 kg). Her oral temperature is 98.5 F (36.9 C). Her blood pressure is 155/86 (abnormal) and her pulse is 60. Her respiration is 20 and oxygen saturation is 99%.   Wt Readings from Last 3 Encounters:  06/20/18 161  lb 1.9 oz (73.1 kg)  05/01/18 164 lb 3.2 oz (74.5 kg)  09/28/17 167 lb 4 oz (75.9 kg)    Ocular: Sclerae unicteric, pupils equal, round and reactive to light Ear-nose-throat: Oropharynx clear, dentition fair Lymphatic: No cervical, supraclavicular or axillary adenopathy Lungs no rales or rhonchi, good excursion bilaterally Heart regular rate and rhythm, no murmur appreciated Abd soft, nontender, positive bowel sounds, no liver or spleen tip palpated on exam, no fluid wave  MSK no focal spinal tenderness, no joint edema Neuro: non-focal, well-oriented, appropriate affect Breasts: Deferred   Lab Results  Component Value Date   WBC 5.3 01/24/2018   HGB 17.0 (H)  01/24/2018   HCT 49.0 (H) 01/24/2018   MCV 89.7 01/24/2018   PLT 188 01/24/2018   Lab Results  Component Value Date   FERRITIN 33 01/24/2018   IRON 101 01/24/2018   TIBC 414 01/24/2018   UIBC 313 01/24/2018   IRONPCTSAT 24 01/24/2018   Lab Results  Component Value Date   RBC 5.46 (H) 01/24/2018   No results found for: KPAFRELGTCHN, LAMBDASER, KAPLAMBRATIO No results found for: IGGSERUM, IGA, IGMSERUM No results found for: Odetta Pink, SPEI   Chemistry      Component Value Date/Time   NA 144 01/24/2018 0822   NA 148 (H) 06/29/2017 0813   NA 143 03/30/2017 0805   K 4.5 01/24/2018 0822   K 3.8 06/29/2017 0813   K 4.0 03/30/2017 0805   CL 106 01/24/2018 0822   CL 105 06/29/2017 0813   CO2 26 01/24/2018 0822   CO2 28 06/29/2017 0813   CO2 26 03/30/2017 0805   BUN 15 01/24/2018 0822   BUN 21 06/29/2017 0813   BUN 18.2 03/30/2017 0805   CREATININE 1.19 (H) 01/24/2018 0822   CREATININE 1.3 (H) 06/29/2017 0813   CREATININE 1.0 03/30/2017 0805      Component Value Date/Time   CALCIUM 10.0 01/24/2018 0822   CALCIUM 9.5 06/29/2017 0813   CALCIUM 9.6 03/30/2017 0805   ALKPHOS 91 01/24/2018 0822   ALKPHOS 78 06/29/2017 0813   ALKPHOS 73 03/30/2017 0805   AST 30 01/24/2018 0822   AST 29 03/30/2017 0805   ALT 27 01/24/2018 0822   ALT 32 06/29/2017 0813   ALT 26 03/30/2017 0805   BILITOT 0.4 01/24/2018 0822   BILITOT 0.67 03/30/2017 0805       Impression and Plan: Shelby Harrington is a very pleasant 67 yo caucasian female with hemochromatosis, heterozygous for the H63D mutation. She is symptomatic with fatigue at this time as well as hypertension.  We will do a partial phlebotomy later this week followed by replacement fluids.  We will recheck labs in 3 months and plan to see her back in another 6 months for follow-up.  She will contact our office with any questions or concerns. We can certainly see her sooner if need be.    Laverna Peace, NP 12/18/201910:58 AM

## 2018-06-21 ENCOUNTER — Inpatient Hospital Stay: Payer: 59

## 2018-06-21 MED ORDER — SODIUM CHLORIDE 0.9 % IV SOLN
Freq: Once | INTRAVENOUS | Status: AC
  Administered 2018-06-21: 09:00:00 via INTRAVENOUS
  Filled 2018-06-21: qty 250

## 2018-06-21 NOTE — Progress Notes (Signed)
Shelby Harrington presents today for phlebotomy per MD orders. Phlebotomy procedure started at 0912 and ended at 0922 with 260 grams removed via 20 G to RAC. This is only a half phlebotomy per [name] NP today.  Diet and nutrition offered. Patient observed for 30 minutes after procedure without any incident. Patient tolerated procedure well.

## 2018-06-21 NOTE — Progress Notes (Signed)
Pt discharged to home with no complaints.  

## 2018-07-10 ENCOUNTER — Encounter: Payer: Self-pay | Admitting: Family Medicine

## 2018-07-10 ENCOUNTER — Ambulatory Visit: Payer: 59 | Admitting: Family Medicine

## 2018-07-10 DIAGNOSIS — N183 Chronic kidney disease, stage 3 unspecified: Secondary | ICD-10-CM

## 2018-07-10 DIAGNOSIS — E669 Obesity, unspecified: Secondary | ICD-10-CM | POA: Diagnosis not present

## 2018-07-10 DIAGNOSIS — Z6832 Body mass index (BMI) 32.0-32.9, adult: Secondary | ICD-10-CM

## 2018-07-10 NOTE — Progress Notes (Signed)
Medical Nutrition Therapy PCP [name], MD Oncologist Burney Gauze, MD Nephrologist Lynwood Dawley, MD  Assessment:  Primary concerns today: Weight management and diet for CKD (Stage 4).  Shelby Harrington saw nephrologist Dr. Royce Harrington, who did not prescribe any dietary restrictions at this time.  She is to follow up with her in one year.  Shelby Harrington was somewhat challenged by the holidays, in terms of both diet and exercise.  She is back on track now, however, and is enjoying her 2-3 X wk of exercise at [facility].  She has started to check BP at home once a week.  The only dietary change she has been consistent with is to moderate protein intake, limiting meat portions to ~4 oz.    Shelby Harrington identified eating out of boredom (or other emotions) as sometimes problematic.  We discussed emotional vs. physical hunger today, and ways to distinguish.  Also encouraged Shelby Harrington to make sure she eats enough at meals.  Yesterday's minimal breakfast and lunch explains her hunger through the day.    Learning Readiness: Ready  Usual eating pattern: 3 meals and 2 snacks per day. Usual physical activity:  Joined in November [facility] workouts 2-3 X wk.   Sleep: Estimates she gets 6-7 hrs/night. BP: 138/82 on Sunday (started amlodipine 1 month ago)  24-hr recall:  (Up at 6:30 AM) B (7 AM)-  1 deviled egg (w/ mayo), 1 slice bread, 4 oz coffee, 2 Stevia, 1 tbsp heavy cream Snk (11:30)-  1 protein bar (6 g protein, 140 kcal), water L (2 PM)-  4 oz 2% cot chs, milk, water Snk ( PM)-   - Exercised 1 hour (30 min aerobic, 30 min weights) at [facility] -  D (7 PM)-  4 oz sandwich steak, onions, steak sauce, 3 air-fried mozarrella poppers, 5 oz wine Snk (10 PM)-  1 slc bread, 1 tbsp cream cheese Typical day? Yes.    Nutritional Diagnosis:  [place]-2.2 Altered nutrition-related laboratory As related to CKD and hemochromatosis.  As evidenced by declining GFR and need for phlebotomy as Fe  accumulates.  Handouts given during visit include:  After-Visit Summary (AVS)  Handouts on potassium and phosphorus food sources  Demonstrated degree of understanding via:  Teach Back  Barriers to learning/adherence to lifestyle change: Husband's disinterest in vegetables; sedentary living for several years.   Monitoring/Evaluation:  Dietary intake, exercise, BP, and body weight in 7 week(s).

## 2018-07-10 NOTE — Patient Instructions (Addendum)
Goals: 1.  Obtain a fruit and/or veg serving at both lunch and dinner.    You don't want too much potassium for your kidney function; however, potassium is important to help keep your blood pressure down.  This means you want to get veg's twice daily, but like protein sources, in moderate amounts.    Salad dressings:  Experiment with homemade (or at least try some lower-sodium commercial ones).   2. Exercise at least 60 minutes 3 times a week.  Remember this can be at the gym or just going for a walk (and can include housework if your HR is elevated and sustained at least 20 min).    Continue to keep protein intake moderate (e.g., 3-4 oz of meat per portion), and aim for some protein at each meal.    Pay attention to your hunger.  When feeling physically hungry, you need to eat.  Make sure you are getting enough at each meal/snack to meet your needs.    Distinguishing between physical and emotional hunger: Physical Hunger . Gradual Onset . Open to options . Hunger can wait . Stop when full . Feels satisfying Emotional Hunger . Occurs suddenly . Specific food craving . Urgency feeling . Keep eating even when full . Results in feeling guilty  ALSO:  HAALT ([over-]hungry, angry, anxious, lonely, tired) + Bored + Depressed:  These are all red flags for making poor decisions.

## 2018-07-17 ENCOUNTER — Encounter: Payer: Self-pay | Admitting: Family

## 2018-08-27 ENCOUNTER — Encounter: Payer: Self-pay | Admitting: Family Medicine

## 2018-08-27 ENCOUNTER — Ambulatory Visit (INDEPENDENT_AMBULATORY_CARE_PROVIDER_SITE_OTHER): Payer: 59 | Admitting: Family Medicine

## 2018-08-27 DIAGNOSIS — N183 Chronic kidney disease, stage 3 unspecified: Secondary | ICD-10-CM

## 2018-08-27 DIAGNOSIS — E669 Obesity, unspecified: Secondary | ICD-10-CM

## 2018-08-27 DIAGNOSIS — Z6832 Body mass index (BMI) 32.0-32.9, adult: Secondary | ICD-10-CM

## 2018-08-27 NOTE — Patient Instructions (Addendum)
There are 24 X 7 hours in a week = 168.  Subtract 8 hrs/night of sleep = 112 hours of awake.   Consider 4 of those 112 hrs being physical activity.  Does this sound like a lot?  Complete the meal planning form provided today.  Criteria for dinner meals: (1) You like it; (2) It's relatively quick & easy to prepare; (3) It meets your nutritional needs.  Use this as a basis for shopping so you can make any of the meals on the list pretty much any time.    - Keep in mind that if you get take-out, remember that it's not all-or-none:  What vegetable can you add to an entree?  - Studies suggest that meals with at least 3 distinct components are more satisfying.  Your meals should look like, taste like, and fee like a REAL meal, not a snack.    Continue to pay attention to what's driving your appetite and food choices.  Use the Food Decisions Algorithm as you find useful.  (Make sure you put this form someplace easily accessible.)  Congratulations on your consistent exercise.  Keep up the good work!

## 2018-08-27 NOTE — Progress Notes (Signed)
Medical Nutrition Therapy PCP [name], MD Oncologist Burney Gauze, MD Nephrologist Lynwood Dawley, MD  Assessment:  Primary concerns today: Weight management and diet for CKD (Stage 4).    Pam has increased her exercise to 3 X wk at the [facility].  She also does significant housework on Saturdays at home.  She uses the TM for 30 min at a time.  Also does strength machines for ~30 min.  She has increased her vegetable intake, but is not always getting them at lunch.    Pam is frustrated that she has not lost any weight.  She has gained strength, however, based on the weights she is now able to use at the gym, and also how her body feels, "more toned").  I reminded her that independent of effects on weight, her consistent exercise is doing many good things for her health.  She also said she feels good about herself since starting to exercise consistently, and she enjoys the [facility].    Learning Readiness: Ready  Usual eating pattern: 3 meals and 2 snacks per day. Usual physical activity:  Works out 3 X wk at [employer].   Sleep: Estimates she gets 6-7 hrs/night.  24-hr recall:  (Up at 8:45 AM) B (9 AM)-  1/2 c coffee, 1 pkt stevia, 1/2 tbsp heavy cream Snk (9:30)-  1 egg, 1 slc bacon, 1 slc toast, 1/2 butter, water  L (2 PM)-  [place]: 1/2 c beef, chx, shrimp, 1/4 c sauteed peppers&onions, 1/2 c rice, cheese, water Snk (5 PM)-  1/2 c coffee, 1 pkt stevia, 1/2 tbsp heavy cream D ( PM)-  --- Snk ( PM)-  1 rice cake, 2 tbsp cream cheese Typical day? Yes.  for a Sunday.  Usually has 3 meals and 2 snacks, but only 2 meals on weekends.      Nutritional Diagnosis:  [place]-2.2 Altered nutrition-related laboratory As related to CKD and hemochromatosis.  As evidenced by declining GFR and need for phlebotomy as Fe accumulates.  Handouts given during visit include:  After-Visit Summary (AVS)  Demonstrated degree of understanding via:  Teach Back   Barriers to learning/adherence to lifestyle change: Husband's disinterest in vegetables; sedentary living for several years.   Monitoring/Evaluation:  Dietary intake, exercise, BP, and body weight in 4 week(s).

## 2018-09-06 ENCOUNTER — Other Ambulatory Visit: Payer: Self-pay | Admitting: Family

## 2018-09-18 ENCOUNTER — Encounter: Payer: Self-pay | Admitting: Family Medicine

## 2018-09-18 ENCOUNTER — Other Ambulatory Visit: Payer: 59

## 2018-09-18 ENCOUNTER — Other Ambulatory Visit: Payer: Self-pay

## 2018-09-18 ENCOUNTER — Ambulatory Visit (INDEPENDENT_AMBULATORY_CARE_PROVIDER_SITE_OTHER): Payer: 59 | Admitting: Family Medicine

## 2018-09-18 DIAGNOSIS — E669 Obesity, unspecified: Secondary | ICD-10-CM | POA: Diagnosis not present

## 2018-09-18 DIAGNOSIS — N183 Chronic kidney disease, stage 3 unspecified: Secondary | ICD-10-CM

## 2018-09-18 DIAGNOSIS — Z6832 Body mass index (BMI) 32.0-32.9, adult: Secondary | ICD-10-CM

## 2018-09-18 NOTE — Progress Notes (Signed)
Medical Nutrition Therapy PCP [name], MD Oncologist Burney Gauze, MD Nephrologist Lynwood Dawley, MD  Assessment:  Primary concerns today: Weight management and diet for CKD (Stage 4).    Shelby Harrington's husband was hospitalized with an intestinal blockage a couple of weeks ago, so life has been chaotic and stressful.  He is back home and at work now, and she feels like things are somewhat back to normal, although there are changes at work for her related to the coronavirus.    Shelby Harrington completed her Meal Planning form, and has made some of the meals she planned, which worked well for her when used.  She did not make use of the Food Decisions Algorithm, but may do so now that her routine is getting back to normal.  We talked more about what drives her food choices, and how understanding this is crucial to making lasting dietary changes.  She acknowledged that planning ahead is key to better food choices.    Learning Readiness: Ready  Usual eating pattern: 3 meals and 2 snacks per day. Usual physical activity:  Had been working out 3 X wk at [employer], but this was on hold when her husband was hospitalized, and the center is now closed b/c of coronavirus.   Sleep: Estimates she gets 6-7 hrs/night.  24-hr recall:  (Up at 6 AM) B (7:20 AM)-  [employer] bacon, egg, and chs biscuit, coffee Snk ( AM)-  water L (1 PM)-  4 oz apple sauce, water Snk ( PM)-  --- D ( PM)-  6" meatball sub (only half of bun), water, 1 slc pound cake  Snk ( PM)-  --- Typical day? No. Atypical breakfast b/c of needing to bring the car in for service.  Shelby Harrington always works thru lunch on Mondays.    Nutritional Diagnosis:  [place]-2.2 Altered nutrition-related laboratory As related to CKD and hemochromatosis.  As evidenced by declining GFR and need for phlebotomy as Fe accumulates.  Handouts given during visit include:  After-Visit Summary (AVS)  Demonstrated degree of understanding via:  Teach Back   Barriers to learning/adherence to lifestyle change: Husband's disinterest in vegetables; sedentary living for several years.   Monitoring/Evaluation:  Dietary intake, exercise, BP, and body weight prn, as scheduling allows (pending [id number] policy).

## 2018-09-18 NOTE — Patient Instructions (Addendum)
-   PLANNING is usually the difference between food choices you are happy about or regret.    - A REAL (nutritionally balanced) meal includes a source of protein, some starch, and veg's (and/or fruit).    - The starch component is optional; however, usually including starch helps to make the meal more satisfying, as well as sustaining - so you don't get hungry much before the next meal.  Pay attn to your level of satisfaction with each meal as well as hunger levels in the next few hours after eating.    - Also aim for meals (especially dinner) that include twice the volume of veg's as either starch or protein.    - Use a luncheon size plate to help get appropriate portion sizes.   - What's driving food choices? - Fatigue, stress, time constraints, out of routine.    - Pay attention to these factors:  What can you control?  How can you achieve some control over these factors?  Ask yourself what you really NEED in the moment, so you authentically address your needs rather than simply eat in response to feelings or circumstances.    Goals: 1. Set aside a few minutes planning time for the coming week:  Meals, snacks, and exercise.   2. Check-in time each night: Review the day for the choices you made; give yourself credit for the choices you're happy with, and for those that were less than desirable, think about what you might do differently if given the same circumstances.    - Use a notebook to record your daily observations.    - Suggestions:  What helped you be successful or what were barriers to intended behaviors?  - Reminder: When we are self-critical for mistakes we have made, it is harder to use those mistakes as learning opportunities.  It's hard to learn when you feel bad or distressed.  This is where self-compassion is valuable.  - Call [employer] again to confirm how many nutrition visits you have coverage for.  I will send Qs to ask by email.

## 2018-09-25 ENCOUNTER — Ambulatory Visit: Payer: 59 | Admitting: Family Medicine

## 2018-11-08 ENCOUNTER — Encounter: Payer: Self-pay | Admitting: Family

## 2018-11-15 ENCOUNTER — Ambulatory Visit: Payer: 59 | Admitting: Family Medicine

## 2018-12-03 ENCOUNTER — Encounter: Payer: Self-pay | Admitting: Family

## 2018-12-03 ENCOUNTER — Telehealth: Payer: Self-pay | Admitting: Hematology & Oncology

## 2018-12-03 NOTE — Telephone Encounter (Signed)
lmom to inform pt of resch June appts to 8/18 at 2 pm per 6/1 sch msg

## 2018-12-18 ENCOUNTER — Ambulatory Visit: Payer: 59 | Admitting: Family

## 2018-12-18 ENCOUNTER — Other Ambulatory Visit: Payer: 59

## 2019-01-14 ENCOUNTER — Emergency Department (HOSPITAL_COMMUNITY): Payer: 59

## 2019-01-14 ENCOUNTER — Encounter (HOSPITAL_COMMUNITY): Payer: Self-pay | Admitting: *Deleted

## 2019-01-14 ENCOUNTER — Emergency Department (HOSPITAL_COMMUNITY)
Admission: EM | Admit: 2019-01-14 | Discharge: 2019-01-14 | Disposition: A | Discharge status: Home / Self Care | Payer: 59 | Attending: Emergency Medicine | Admitting: Emergency Medicine

## 2019-01-14 ENCOUNTER — Other Ambulatory Visit: Payer: Self-pay

## 2019-01-14 DIAGNOSIS — W2210XA Striking against or struck by unspecified automobile airbag, initial encounter: Secondary | ICD-10-CM | POA: Diagnosis not present

## 2019-01-14 DIAGNOSIS — Y999 Unspecified external cause status: Secondary | ICD-10-CM | POA: Diagnosis not present

## 2019-01-14 DIAGNOSIS — Y9389 Activity, other specified: Secondary | ICD-10-CM | POA: Diagnosis not present

## 2019-01-14 DIAGNOSIS — S8011XA Contusion of right lower leg, initial encounter: Secondary | ICD-10-CM

## 2019-01-14 DIAGNOSIS — R0789 Other chest pain: Secondary | ICD-10-CM

## 2019-01-14 DIAGNOSIS — S59912A Unspecified injury of left forearm, initial encounter: Secondary | ICD-10-CM | POA: Diagnosis present

## 2019-01-14 DIAGNOSIS — S40022A Contusion of left upper arm, initial encounter: Secondary | ICD-10-CM

## 2019-01-14 DIAGNOSIS — T22012A Burn of unspecified degree of left forearm, initial encounter: Secondary | ICD-10-CM | POA: Diagnosis not present

## 2019-01-14 DIAGNOSIS — Z880 Allergy status to penicillin: Secondary | ICD-10-CM | POA: Diagnosis not present

## 2019-01-14 DIAGNOSIS — I10 Essential (primary) hypertension: Secondary | ICD-10-CM | POA: Insufficient documentation

## 2019-01-14 DIAGNOSIS — Y92410 Unspecified street and highway as the place of occurrence of the external cause: Secondary | ICD-10-CM | POA: Insufficient documentation

## 2019-01-14 DIAGNOSIS — S00531A Contusion of lip, initial encounter: Secondary | ICD-10-CM | POA: Diagnosis not present

## 2019-01-14 DIAGNOSIS — Z79899 Other long term (current) drug therapy: Secondary | ICD-10-CM | POA: Diagnosis not present

## 2019-01-14 LAB — CBC
HCT: 49.5 % — ABNORMAL HIGH (ref 36.0–46.0)
Hemoglobin: 16.4 g/dL — ABNORMAL HIGH (ref 12.0–15.0)
MCH: 30.5 pg (ref 26.0–34.0)
MCHC: 33.1 g/dL (ref 30.0–36.0)
MCV: 92.2 fL (ref 80.0–100.0)
Platelets: 199 10*3/uL (ref 150–400)
RBC: 5.37 MIL/uL — ABNORMAL HIGH (ref 3.87–5.11)
RDW: 13.6 % (ref 11.5–15.5)
WBC: 6.9 10*3/uL (ref 4.0–10.5)
nRBC: 0 % (ref 0.0–0.2)

## 2019-01-14 LAB — BASIC METABOLIC PANEL
Anion gap: 14 (ref 5–15)
BUN: 18 mg/dL (ref 8–23)
CO2: 21 mmol/L — ABNORMAL LOW (ref 22–32)
Calcium: 9 mg/dL (ref 8.9–10.3)
Chloride: 105 mmol/L (ref 98–111)
Creatinine, Ser: 1.11 mg/dL — ABNORMAL HIGH (ref 0.44–1.00)
GFR calc Af Amer: 60 mL/min — ABNORMAL LOW (ref >60)
GFR calc non Af Amer: 52 mL/min — ABNORMAL LOW (ref >60)
Glucose, Bld: 120 mg/dL — ABNORMAL HIGH (ref 70–99)
Potassium: 3.4 mmol/L — ABNORMAL LOW (ref 3.5–5.1)
Sodium: 140 mmol/L (ref 135–145)

## 2019-01-14 LAB — TROPONIN I (HIGH SENSITIVITY)
Troponin I (High Sensitivity): 3 ng/L (ref <18)
Troponin I (High Sensitivity): 7 ng/L (ref <18)

## 2019-01-14 MED ORDER — HYDROCODONE-ACETAMINOPHEN 5-325 MG PO TABS: 1.0000 | ORAL_TABLET | ORAL | 0 refills | Status: DC | PRN

## 2019-01-14 MED ORDER — HYDROCODONE-ACETAMINOPHEN 5-325 MG PO TABS
1.0000 | ORAL_TABLET | Freq: Once | ORAL | Status: AC
  Administered 2019-01-14: 1 via ORAL
  Filled 2019-01-14: qty 1

## 2019-01-14 MED ORDER — DIAZEPAM 5 MG PO TABS: 5.0000 mg | ORAL_TABLET | Freq: Two times a day (BID) | ORAL | 0 refills | Status: DC

## 2019-01-14 MED ORDER — HYDROCORTISONE 1 % EX CREA
TOPICAL_CREAM | Freq: Once | CUTANEOUS | Status: AC
  Administered 2019-01-14: 11:00:00 via TOPICAL
  Filled 2019-01-14: qty 28

## 2019-01-14 MED ORDER — IBUPROFEN 400 MG PO TABS
600.0000 mg | ORAL_TABLET | Freq: Once | ORAL | Status: DC
  Filled 2019-01-14: qty 1

## 2019-01-14 NOTE — ED Notes (Signed)
Patient transported to X-ray 
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
## 2019-02-14 ENCOUNTER — Telehealth: Payer: Self-pay | Admitting: Family

## 2019-02-19 ENCOUNTER — Other Ambulatory Visit: Payer: 59

## 2019-02-19 ENCOUNTER — Ambulatory Visit: Payer: 59 | Admitting: Family

## 2019-02-26 ENCOUNTER — Other Ambulatory Visit: Payer: Self-pay

## 2019-02-26 ENCOUNTER — Encounter: Payer: Self-pay | Admitting: Family

## 2019-02-26 ENCOUNTER — Inpatient Hospital Stay: Payer: 59 | Attending: Family

## 2019-02-26 ENCOUNTER — Inpatient Hospital Stay (HOSPITAL_BASED_OUTPATIENT_CLINIC_OR_DEPARTMENT_OTHER): Payer: 59 | Admitting: Family

## 2019-02-26 DIAGNOSIS — Z885 Allergy status to narcotic agent status: Secondary | ICD-10-CM | POA: Insufficient documentation

## 2019-02-26 DIAGNOSIS — Z888 Allergy status to other drugs, medicaments and biological substances status: Secondary | ICD-10-CM | POA: Diagnosis not present

## 2019-02-26 DIAGNOSIS — R5383 Other fatigue: Secondary | ICD-10-CM | POA: Insufficient documentation

## 2019-02-26 DIAGNOSIS — M549 Dorsalgia, unspecified: Secondary | ICD-10-CM | POA: Insufficient documentation

## 2019-02-26 DIAGNOSIS — Z880 Allergy status to penicillin: Secondary | ICD-10-CM | POA: Insufficient documentation

## 2019-02-26 DIAGNOSIS — Z79899 Other long term (current) drug therapy: Secondary | ICD-10-CM | POA: Diagnosis not present

## 2019-02-26 DIAGNOSIS — R109 Unspecified abdominal pain: Secondary | ICD-10-CM | POA: Insufficient documentation

## 2019-02-26 DIAGNOSIS — R51 Headache: Secondary | ICD-10-CM | POA: Diagnosis not present

## 2019-02-26 LAB — CBC WITH DIFFERENTIAL (CANCER CENTER ONLY)
Abs Immature Granulocytes: 0.01 10*3/uL (ref 0.00–0.07)
Basophils Absolute: 0.1 10*3/uL (ref 0.0–0.1)
Basophils Relative: 1 %
Eosinophils Absolute: 0.2 10*3/uL (ref 0.0–0.5)
Eosinophils Relative: 3 %
HCT: 46.6 % — ABNORMAL HIGH (ref 36.0–46.0)
Hemoglobin: 15.8 g/dL — ABNORMAL HIGH (ref 12.0–15.0)
Immature Granulocytes: 0 %
Lymphocytes Relative: 37 %
Lymphs Abs: 2.3 10*3/uL (ref 0.7–4.0)
MCH: 31.3 pg (ref 26.0–34.0)
MCHC: 33.9 g/dL (ref 30.0–36.0)
MCV: 92.3 fL (ref 80.0–100.0)
Monocytes Absolute: 0.6 10*3/uL (ref 0.1–1.0)
Monocytes Relative: 10 %
Neutro Abs: 3.1 10*3/uL (ref 1.7–7.7)
Neutrophils Relative %: 49 %
Platelet Count: 205 10*3/uL (ref 150–400)
RBC: 5.05 MIL/uL (ref 3.87–5.11)
RDW: 12.6 % (ref 11.5–15.5)
WBC Count: 6.4 10*3/uL (ref 4.0–10.5)
nRBC: 0 % (ref 0.0–0.2)

## 2019-02-26 LAB — CMP (CANCER CENTER ONLY)
ALT: 26 U/L (ref 0–44)
AST: 28 U/L (ref 15–41)
Albumin: 4.1 g/dL (ref 3.5–5.0)
Alkaline Phosphatase: 98 U/L (ref 38–126)
Anion gap: 8 (ref 5–15)
BUN: 17 mg/dL (ref 8–23)
CO2: 27 mmol/L (ref 22–32)
Calcium: 9.2 mg/dL (ref 8.9–10.3)
Chloride: 108 mmol/L (ref 98–111)
Creatinine: 1.11 mg/dL — ABNORMAL HIGH (ref 0.44–1.00)
GFR, Est AFR Am: 60 mL/min — ABNORMAL LOW (ref >60)
GFR, Estimated: 52 mL/min — ABNORMAL LOW (ref >60)
Glucose, Bld: 89 mg/dL (ref 70–99)
Potassium: 4.1 mmol/L (ref 3.5–5.1)
Sodium: 143 mmol/L (ref 135–145)
Total Bilirubin: 0.6 mg/dL (ref 0.3–1.2)
Total Protein: 6.9 g/dL (ref 6.5–8.1)
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
## 2019-02-27 ENCOUNTER — Other Ambulatory Visit: Payer: Self-pay | Admitting: Family Medicine

## 2019-02-27 DIAGNOSIS — Z1231 Encounter for screening mammogram for malignant neoplasm of breast: Secondary | ICD-10-CM

## 2019-02-27 LAB — IRON AND TIBC
Iron: 134 ug/dL (ref 41–142)
Saturation Ratios: 37 % (ref 21–57)
TIBC: 361 ug/dL (ref 236–444)
UIBC: 228 ug/dL (ref 120–384)

## 2019-02-27 LAB — FERRITIN: Ferritin: 44 ng/mL (ref 11–307)

## 2019-04-11 ENCOUNTER — Ambulatory Visit
Admission: RE | Admit: 2019-04-11 | Discharge: 2019-04-11 | Disposition: A | Discharge status: Home / Self Care | Payer: 59 | Source: Ambulatory Visit | Attending: Family Medicine | Admitting: Family Medicine

## 2019-04-11 ENCOUNTER — Other Ambulatory Visit: Payer: Self-pay

## 2019-04-11 DIAGNOSIS — Z1231 Encounter for screening mammogram for malignant neoplasm of breast: Secondary | ICD-10-CM

## 2019-05-27 ENCOUNTER — Telehealth: Payer: Self-pay | Admitting: *Deleted

## 2019-05-27 ENCOUNTER — Other Ambulatory Visit: Payer: 59

## 2019-05-27 NOTE — Telephone Encounter (Signed)
Faxed lab results received from 05/24/19 from patient with note attached from patient asking if she needs to keep lab appt for 07/03/19.  Dr. Marin Olp notified and would like for pt to keep scheduled lab appt on 07/03/19.  Call placed to patient and patient notified of MD orders to keep appt for 07/03/19.  Pt appreciative of call and has no questions or concerns at this time.

## 2019-07-02 ENCOUNTER — Other Ambulatory Visit: Payer: Self-pay | Admitting: *Deleted

## 2019-07-03 ENCOUNTER — Inpatient Hospital Stay: Payer: 59 | Attending: Hematology & Oncology

## 2019-07-03 ENCOUNTER — Other Ambulatory Visit: Payer: Self-pay

## 2019-07-03 ENCOUNTER — Encounter: Payer: Self-pay | Admitting: Family

## 2019-07-03 LAB — CMP (CANCER CENTER ONLY)
ALT: 27 U/L (ref 0–44)
AST: 25 U/L (ref 15–41)
Albumin: 4.1 g/dL (ref 3.5–5.0)
Alkaline Phosphatase: 101 U/L (ref 38–126)
Anion gap: 8 (ref 5–15)
BUN: 17 mg/dL (ref 8–23)
CO2: 25 mmol/L (ref 22–32)
Calcium: 9 mg/dL (ref 8.9–10.3)
Chloride: 108 mmol/L (ref 98–111)
Creatinine: 1.02 mg/dL — ABNORMAL HIGH (ref 0.44–1.00)
GFR, Est AFR Am: >60 mL/min (ref >60)
GFR, Estimated: 57 mL/min — ABNORMAL LOW (ref >60)
Glucose, Bld: 92 mg/dL (ref 70–99)
Potassium: 3.8 mmol/L (ref 3.5–5.1)
Sodium: 141 mmol/L (ref 135–145)
Total Bilirubin: 0.4 mg/dL (ref 0.3–1.2)
Total Protein: 6.4 g/dL — ABNORMAL LOW (ref 6.5–8.1)

## 2019-07-03 LAB — CBC WITH DIFFERENTIAL (CANCER CENTER ONLY)
Abs Immature Granulocytes: 0.02 10*3/uL (ref 0.00–0.07)
Basophils Absolute: 0.1 10*3/uL (ref 0.0–0.1)
Basophils Relative: 1 %
Eosinophils Absolute: 0.3 10*3/uL (ref 0.0–0.5)
Eosinophils Relative: 5 %
HCT: 47.8 % — ABNORMAL HIGH (ref 36.0–46.0)
Hemoglobin: 16.1 g/dL — ABNORMAL HIGH (ref 12.0–15.0)
Immature Granulocytes: 0 %
Lymphocytes Relative: 33 %
Lymphs Abs: 2.4 10*3/uL (ref 0.7–4.0)
MCH: 31.5 pg (ref 26.0–34.0)
MCHC: 33.7 g/dL (ref 30.0–36.0)
MCV: 93.5 fL (ref 80.0–100.0)
Monocytes Absolute: 0.9 10*3/uL (ref 0.1–1.0)
Monocytes Relative: 12 %
Neutro Abs: 3.6 10*3/uL (ref 1.7–7.7)
Neutrophils Relative %: 49 %
Platelet Count: 198 10*3/uL (ref 150–400)
RBC: 5.11 MIL/uL (ref 3.87–5.11)
RDW: 12.8 % (ref 11.5–15.5)
WBC Count: 7.3 10*3/uL (ref 4.0–10.5)
nRBC: 0 % (ref 0.0–0.2)

## 2019-07-03 LAB — FERRITIN: Ferritin: 36 ng/mL (ref 11–307)

## 2019-07-03 LAB — IRON AND TIBC
Iron: 108 ug/dL (ref 41–142)
Saturation Ratios: 30 % (ref 21–57)
TIBC: 359 ug/dL (ref 236–444)
UIBC: 250 ug/dL (ref 120–384)

## 2019-08-27 ENCOUNTER — Other Ambulatory Visit: Payer: Self-pay | Admitting: *Deleted

## 2019-08-28 ENCOUNTER — Inpatient Hospital Stay: Payer: 59 | Admitting: Hematology & Oncology

## 2019-08-28 ENCOUNTER — Inpatient Hospital Stay: Payer: 59 | Attending: Hematology & Oncology

## 2019-09-17 ENCOUNTER — Other Ambulatory Visit: Payer: Self-pay

## 2019-09-17 ENCOUNTER — Inpatient Hospital Stay: Payer: 59 | Attending: Hematology & Oncology

## 2019-09-17 ENCOUNTER — Encounter: Payer: Self-pay | Admitting: Family

## 2019-09-17 ENCOUNTER — Inpatient Hospital Stay (HOSPITAL_BASED_OUTPATIENT_CLINIC_OR_DEPARTMENT_OTHER): Payer: 59 | Admitting: Family

## 2019-09-17 DIAGNOSIS — Z7982 Long term (current) use of aspirin: Secondary | ICD-10-CM | POA: Insufficient documentation

## 2019-09-17 DIAGNOSIS — R002 Palpitations: Secondary | ICD-10-CM | POA: Insufficient documentation

## 2019-09-17 DIAGNOSIS — Z79899 Other long term (current) drug therapy: Secondary | ICD-10-CM | POA: Diagnosis not present

## 2019-09-17 DIAGNOSIS — R5383 Other fatigue: Secondary | ICD-10-CM | POA: Diagnosis not present

## 2019-09-17 DIAGNOSIS — R0602 Shortness of breath: Secondary | ICD-10-CM | POA: Insufficient documentation

## 2019-09-17 LAB — CBC WITH DIFFERENTIAL (CANCER CENTER ONLY)
Abs Immature Granulocytes: 0.02 10*3/uL (ref 0.00–0.07)
Basophils Absolute: 0.1 10*3/uL (ref 0.0–0.1)
Basophils Relative: 1 %
Eosinophils Absolute: 0.3 10*3/uL (ref 0.0–0.5)
Eosinophils Relative: 3 %
HCT: 49 % — ABNORMAL HIGH (ref 36.0–46.0)
Hemoglobin: 16.4 g/dL — ABNORMAL HIGH (ref 12.0–15.0)
Immature Granulocytes: 0 %
Lymphocytes Relative: 37 %
Lymphs Abs: 2.9 10*3/uL (ref 0.7–4.0)
MCH: 30.8 pg (ref 26.0–34.0)
MCHC: 33.5 g/dL (ref 30.0–36.0)
MCV: 91.9 fL (ref 80.0–100.0)
Monocytes Absolute: 0.8 10*3/uL (ref 0.1–1.0)
Monocytes Relative: 9 %
Neutro Abs: 3.9 10*3/uL (ref 1.7–7.7)
Neutrophils Relative %: 50 %
Platelet Count: 211 10*3/uL (ref 150–400)
RBC: 5.33 MIL/uL — ABNORMAL HIGH (ref 3.87–5.11)
RDW: 12.4 % (ref 11.5–15.5)
WBC Count: 8 10*3/uL (ref 4.0–10.5)
nRBC: 0 % (ref 0.0–0.2)

## 2019-09-17 LAB — CMP (CANCER CENTER ONLY)
ALT: 25 U/L (ref 0–44)
AST: 24 U/L (ref 15–41)
Albumin: 4.4 g/dL (ref 3.5–5.0)
Alkaline Phosphatase: 98 U/L (ref 38–126)
Anion gap: 6 (ref 5–15)
BUN: 26 mg/dL — ABNORMAL HIGH (ref 8–23)
CO2: 29 mmol/L (ref 22–32)
Calcium: 9.3 mg/dL (ref 8.9–10.3)
Chloride: 109 mmol/L (ref 98–111)
Creatinine: 1.16 mg/dL — ABNORMAL HIGH (ref 0.44–1.00)
GFR, Est AFR Am: 57 mL/min — ABNORMAL LOW (ref >60)
GFR, Estimated: 49 mL/min — ABNORMAL LOW (ref >60)
Glucose, Bld: 104 mg/dL — ABNORMAL HIGH (ref 70–99)
Potassium: 4.5 mmol/L (ref 3.5–5.1)
Sodium: 144 mmol/L (ref 135–145)
Total Bilirubin: 0.4 mg/dL (ref 0.3–1.2)
Total Protein: 6.8 g/dL (ref 6.5–8.1)

## 2019-09-18 ENCOUNTER — Telehealth: Payer: Self-pay | Admitting: Family

## 2019-09-18 ENCOUNTER — Encounter: Payer: Self-pay | Admitting: Family

## 2019-09-18 LAB — IRON AND TIBC
Iron: 98 ug/dL (ref 41–142)
Saturation Ratios: 25 % (ref 21–57)
TIBC: 388 ug/dL (ref 236–444)
UIBC: 289 ug/dL (ref 120–384)

## 2019-09-18 LAB — FERRITIN: Ferritin: 64 ng/mL (ref 11–307)

## 2019-09-18 NOTE — Progress Notes (Signed)
Hematology and Oncology Follow Up Visit  Shelby Harrington [id number] 1950-10-19 69 y.o. 09/18/2019   Principle Diagnosis:  Hemochromatosis - Heterozygous H63D mutation  Current Therapy:   Aspirin 81 mg PO daily  Phlebotomy to keep ferritin <50 and iron sat <30%   Interim History:  [name] is here today for follow-up. She is doing well but has noted some mild fatigue at times and occasional palpitations and SOB with over exertion.  She has started exercising again and is really enjoying dance, dance, revolution.  She is taking her baby aspirin daily as prescribed.  No bleeding. She does bruise easily but not in excess. No petechiae.  No fever, chills, n/v, cough, rash, dizziness, chest pain, abdominal pain or changes in bowel ro bladder habits.  No swelling, tenderness, numbness or tingling in her extremities.  No falls or syncope.  She has maintained a good appetite and is staying well hydrated. Her weight is stable.   ECOG Performance Status: 1 - Symptomatic but completely ambulatory  Medications:  Allergies as of 09/17/2019      Reactions   Latex Other (See Comments)   Skin blisters   Codeine Rash   Penicillins Rash   Prednisone Palpitations   Tape Rash   Adhesive tape      Medication List       Accurate as of September 17, 2019 11:59 PM. If you have any questions, ask your nurse or doctor.        STOP taking these medications   amlodipine-atorvastatin 2.5-10 MG tablet Commonly known as: CADUET Stopped by: Laverna Peace, NP   diazepam 5 MG tablet Commonly known as: VALIUM Stopped by: Laverna Peace, NP     TAKE these medications   amLODipine 5 MG tablet Commonly known as: NORVASC Take 5 mg by mouth daily.   aspirin 81 MG tablet Take 81 mg by mouth daily.   CALCIUM PO Take 1 capsule by mouth daily. 1200mg  daily   CENTRUM SILVER PO Take 1 tablet by mouth daily.   Cetirizine HCl 10 MG Caps Commonly known as: ZyrTEC Allergy One at bedtime   clotrimazole-betamethasone cream Commonly known as: LOTRISONE Apply 1 application topically 2 (two) times daily.   cyclobenzaprine 10 MG tablet Commonly known as: FLEXERIL Take 10 mg by mouth at bedtime as needed for muscle spasms.   fluticasone 50 MCG/ACT nasal spray Commonly known as: FLONASE daily.   GLUCOSAMINE CHONDR COMPLEX PO Take 1 capsule by mouth daily.   HYDROcodone-acetaminophen 5-325 MG tablet Commonly known as: NORCO/VICODIN Take 1 tablet by mouth every 4 (four) hours as needed.   metoprolol succinate 50 MG 24 hr tablet Commonly known as: TOPROL-XL Take 50 mg by mouth daily.       Allergies:  Allergies  Allergen Reactions  . Latex Other (See Comments)    Skin blisters  . Codeine Rash  . Penicillins Rash  . Prednisone Palpitations  . Tape Rash    Adhesive tape    Past Medical History, Surgical history, Social history, and Family History were reviewed and updated.  Review of Systems: All other 10 point review of systems is negative.   Physical Exam:  height is 4\' 11"  (1.499 m) and weight is 172 lb 12.8 oz (78.4 kg). Her temporal temperature is 97.1 F (36.2 C) (abnormal). Her blood pressure is 123/70 and her pulse is 59 (abnormal). Her respiration is 18 and oxygen saturation is 100%.   Wt Readings from Last 3 Encounters:  09/17/19 172 lb 12.8  oz (78.4 kg)  02/26/19 166 lb (75.3 kg)  09/18/18 165 lb (74.8 kg)    Ocular: Sclerae unicteric, pupils equal, round and reactive to light Ear-nose-throat: Oropharynx clear, dentition fair Lymphatic: No cervical or supraclavicular adenopathy Lungs no rales or rhonchi, good excursion bilaterally Heart regular rate and rhythm, no murmur appreciated Abd soft, nontender, positive bowel sounds, no liver or spleen tip palpated on exam, no fluid wave  MSK no focal spinal tenderness, no joint edema Neuro: non-focal, well-oriented, appropriate affect Breasts: Deferred   Lab Results  Component Value Date   WBC  8.0 09/17/2019   HGB 16.4 (H) 09/17/2019   HCT 49.0 (H) 09/17/2019   MCV 91.9 09/17/2019   PLT 211 09/17/2019   Lab Results  Component Value Date   FERRITIN 36 07/03/2019   IRON 108 07/03/2019   TIBC 359 07/03/2019   UIBC 250 07/03/2019   IRONPCTSAT 30 07/03/2019   Lab Results  Component Value Date   RBC 5.33 (H) 09/17/2019   No results found for: KPAFRELGTCHN, LAMBDASER, KAPLAMBRATIO No results found for: IGGSERUM, IGA, IGMSERUM No results found for: Odetta Pink, SPEI   Chemistry      Component Value Date/Time   NA 144 09/17/2019 1506   NA 148 (H) 06/29/2017 0813   NA 143 03/30/2017 0805   K 4.5 09/17/2019 1506   K 3.8 06/29/2017 0813   K 4.0 03/30/2017 0805   CL 109 09/17/2019 1506   CL 105 06/29/2017 0813   CO2 29 09/17/2019 1506   CO2 28 06/29/2017 0813   CO2 26 03/30/2017 0805   BUN 26 (H) 09/17/2019 1506   BUN 21 06/29/2017 0813   BUN 18.2 03/30/2017 0805   CREATININE 1.16 (H) 09/17/2019 1506   CREATININE 1.3 (H) 06/29/2017 0813   CREATININE 1.0 03/30/2017 0805      Component Value Date/Time   CALCIUM 9.3 09/17/2019 1506   CALCIUM 9.5 06/29/2017 0813   CALCIUM 9.6 03/30/2017 0805   ALKPHOS 98 09/17/2019 1506   ALKPHOS 78 06/29/2017 0813   ALKPHOS 73 03/30/2017 0805   AST 24 09/17/2019 1506   AST 29 03/30/2017 0805   ALT 25 09/17/2019 1506   ALT 32 06/29/2017 0813   ALT 26 03/30/2017 0805   BILITOT 0.4 09/17/2019 1506   BILITOT 0.67 03/30/2017 0805       Impression and Plan: [name] is a very pleasant 69 yo caucasian female with hemochromatosis, heterozygous for the H63D mutation.  Iron studies are pending. We will bring her back in for phlebotomy if needed.  We will plan to see her back in another 3 months.  She will contact our office with any questions or concerns. We can certainly see her sooner if needed.   Laverna Peace, NP 3/17/20219:00 AM

## 2019-09-18 NOTE — Telephone Encounter (Signed)
No los 3/16

## 2019-09-24 ENCOUNTER — Other Ambulatory Visit: Payer: Self-pay

## 2019-09-24 ENCOUNTER — Inpatient Hospital Stay: Payer: 59

## 2019-09-24 MED ORDER — SODIUM CHLORIDE 0.9 % IV SOLN
Freq: Once | INTRAVENOUS | Status: AC
  Filled 2019-09-24: qty 250

## 2019-09-24 NOTE — Progress Notes (Signed)
Shelby Harrington presents today for phlebotomy per MD orders. Phlebotomy procedure started at 1438 via 20G right AC and ended at 1500. 500 grams removed. To receive 500 ml NS post phlebotomy. Patient observed for 30 minutes after procedure without any incident. Patient tolerated procedure well. IV needle removed intact.

## 2019-09-24 NOTE — Patient Instructions (Signed)

## 2019-12-18 ENCOUNTER — Inpatient Hospital Stay: Payer: 59

## 2019-12-18 ENCOUNTER — Inpatient Hospital Stay: Payer: 59 | Admitting: Family

## 2019-12-24 ENCOUNTER — Encounter: Payer: Self-pay | Admitting: Family

## 2019-12-24 ENCOUNTER — Inpatient Hospital Stay (HOSPITAL_BASED_OUTPATIENT_CLINIC_OR_DEPARTMENT_OTHER): Payer: 59 | Admitting: Family

## 2019-12-24 ENCOUNTER — Inpatient Hospital Stay: Payer: 59 | Attending: Hematology & Oncology

## 2019-12-24 ENCOUNTER — Other Ambulatory Visit: Payer: Self-pay

## 2019-12-24 DIAGNOSIS — Z880 Allergy status to penicillin: Secondary | ICD-10-CM | POA: Insufficient documentation

## 2019-12-24 DIAGNOSIS — Z888 Allergy status to other drugs, medicaments and biological substances status: Secondary | ICD-10-CM | POA: Insufficient documentation

## 2019-12-24 DIAGNOSIS — R002 Palpitations: Secondary | ICD-10-CM | POA: Insufficient documentation

## 2019-12-24 DIAGNOSIS — R202 Paresthesia of skin: Secondary | ICD-10-CM | POA: Diagnosis not present

## 2019-12-24 DIAGNOSIS — Z885 Allergy status to narcotic agent status: Secondary | ICD-10-CM | POA: Insufficient documentation

## 2019-12-24 DIAGNOSIS — Z7982 Long term (current) use of aspirin: Secondary | ICD-10-CM | POA: Diagnosis not present

## 2019-12-24 DIAGNOSIS — R5383 Other fatigue: Secondary | ICD-10-CM | POA: Diagnosis not present

## 2019-12-24 LAB — CBC WITH DIFFERENTIAL (CANCER CENTER ONLY)
Abs Immature Granulocytes: 0.02 10*3/uL (ref 0.00–0.07)
Basophils Absolute: 0.1 10*3/uL (ref 0.0–0.1)
Basophils Relative: 1 %
Eosinophils Absolute: 0.3 10*3/uL (ref 0.0–0.5)
Eosinophils Relative: 4 %
HCT: 49.1 % — ABNORMAL HIGH (ref 36.0–46.0)
Hemoglobin: 16.5 g/dL — ABNORMAL HIGH (ref 12.0–15.0)
Immature Granulocytes: 0 %
Lymphocytes Relative: 42 %
Lymphs Abs: 2.7 10*3/uL (ref 0.7–4.0)
MCH: 31.3 pg (ref 26.0–34.0)
MCHC: 33.6 g/dL (ref 30.0–36.0)
MCV: 93 fL (ref 80.0–100.0)
Monocytes Absolute: 0.8 10*3/uL (ref 0.1–1.0)
Monocytes Relative: 12 %
Neutro Abs: 2.7 10*3/uL (ref 1.7–7.7)
Neutrophils Relative %: 41 %
Platelet Count: 163 10*3/uL (ref 150–400)
RBC: 5.28 MIL/uL — ABNORMAL HIGH (ref 3.87–5.11)
RDW: 12.3 % (ref 11.5–15.5)
WBC Count: 6.5 10*3/uL (ref 4.0–10.5)
nRBC: 0 % (ref 0.0–0.2)

## 2019-12-24 LAB — CMP (CANCER CENTER ONLY)
ALT: 23 U/L (ref 0–44)
AST: 26 U/L (ref 15–41)
Albumin: 4.3 g/dL (ref 3.5–5.0)
Alkaline Phosphatase: 66 U/L (ref 38–126)
Anion gap: 8 (ref 5–15)
BUN: 17 mg/dL (ref 8–23)
CO2: 26 mmol/L (ref 22–32)
Calcium: 9.3 mg/dL (ref 8.9–10.3)
Chloride: 107 mmol/L (ref 98–111)
Creatinine: 1.05 mg/dL — ABNORMAL HIGH (ref 0.44–1.00)
GFR, Est AFR Am: >60 mL/min (ref >60)
GFR, Estimated: 55 mL/min — ABNORMAL LOW (ref >60)
Glucose, Bld: 82 mg/dL (ref 70–99)
Potassium: 4.4 mmol/L (ref 3.5–5.1)
Sodium: 141 mmol/L (ref 135–145)
Total Bilirubin: 0.5 mg/dL (ref 0.3–1.2)
Total Protein: 6.9 g/dL (ref 6.5–8.1)

## 2019-12-24 NOTE — Progress Notes (Signed)
Hematology and Oncology Follow Up Visit  Shelby Harrington [id number] 29-Aug-1950 69 y.o. 12/24/2019   Principle Diagnosis:  Hemochromatosis - Heterozygous H63D mutation  Current Therapy:        Aspirin 81 mg PO daily  Phlebotomy to keep ferritin <50 and iron sat <30%   Interim History:  Shelby Harrington is here today for follow-up. She is doing well but has some occasional brief palpitations and fatigued.  Hgb is 16.5, MCV 93, WBC count 6.5 and platelets 163. Iron studies are pending.  No fever, chills, n/v, cough, rash, dizziness, SOB, chest pain, abdominal pain or changes in bowel or bladder habits.  No swelling, tenderness in her extremities.  She has occasional positional tingling in hands when reading or driving.  No falls or syncope.  She has maintained a good appetite and is staying well hydrated. Her weight is stable.   ECOG Performance Status: 1 - Symptomatic but completely ambulatory  Medications:  Allergies as of 12/24/2019      Reactions   Latex Other (See Comments)   Skin blisters   Codeine Rash   Penicillins Rash   Prednisone Palpitations   Tape Rash   Adhesive tape      Medication List       Accurate as of December 24, 2019  3:52 PM. If you have any questions, ask your nurse or doctor.        amLODipine 5 MG tablet Commonly known as: NORVASC Take 5 mg by mouth daily.   aspirin 81 MG tablet Take 81 mg by mouth daily.   CALCIUM PO Take 1 capsule by mouth daily. 1200mg  daily   CENTRUM SILVER PO Take 1 tablet by mouth daily.   Cetirizine HCl 10 MG Caps Commonly known as: ZyrTEC Allergy One at bedtime   clotrimazole-betamethasone cream Commonly known as: LOTRISONE Apply 1 application topically 2 (two) times daily.   cyclobenzaprine 10 MG tablet Commonly known as: FLEXERIL Take 10 mg by mouth at bedtime as needed for muscle spasms.   fluticasone 50 MCG/ACT nasal spray Commonly known as: FLONASE daily.   GLUCOSAMINE CHONDR COMPLEX PO Take 1  capsule by mouth daily.   HYDROcodone-acetaminophen 5-325 MG tablet Commonly known as: NORCO/VICODIN Take 1 tablet by mouth every 4 (four) hours as needed.   metoprolol succinate 50 MG 24 hr tablet Commonly known as: TOPROL-XL Take 50 mg by mouth daily.       Allergies:  Allergies  Allergen Reactions  . Latex Other (See Comments)    Skin blisters  . Codeine Rash  . Penicillins Rash  . Prednisone Palpitations  . Tape Rash    Adhesive tape    Past Medical History, Surgical history, Social history, and Family History were reviewed and updated.  Review of Systems: All other 10 point review of systems is negative.   Physical Exam:  height is 4\' 11"  (1.499 m) and weight is 158 lb (71.7 kg). Her oral temperature is 98.1 F (36.7 C). Her blood pressure is 131/95 (abnormal) and her pulse is 45 (abnormal). Her respiration is 18 and oxygen saturation is 100%.   Wt Readings from Last 3 Encounters:  12/24/19 158 lb (71.7 kg)  09/17/19 172 lb 12.8 oz (78.4 kg)  02/26/19 166 lb (75.3 kg)    Ocular: Sclerae unicteric, pupils equal, round and reactive to light Ear-nose-throat: Oropharynx clear, dentition fair Lymphatic: No cervical or supraclavicular adenopathy Lungs no rales or rhonchi, good excursion bilaterally Heart regular rate and rhythm, no murmur appreciated Abd  soft, nontender, positive bowel sounds, no liver or spleen tip palpated on exam, no fluid wave  MSK no focal spinal tenderness, no joint edema Neuro: non-focal, well-oriented, appropriate affect Breasts: Deferred   Lab Results  Component Value Date   WBC 6.5 12/24/2019   HGB 16.5 (H) 12/24/2019   HCT 49.1 (H) 12/24/2019   MCV 93.0 12/24/2019   PLT 163 12/24/2019   Lab Results  Component Value Date   FERRITIN 64 09/17/2019   IRON 98 09/17/2019   TIBC 388 09/17/2019   UIBC 289 09/17/2019   IRONPCTSAT 25 09/17/2019   Lab Results  Component Value Date   RBC 5.28 (H) 12/24/2019   No results found for:  KPAFRELGTCHN, LAMBDASER, KAPLAMBRATIO No results found for: Kandis Cocking, IGMSERUM No results found for: Odetta Pink, SPEI   Chemistry      Component Value Date/Time   NA 141 12/24/2019 1501   NA 148 (H) 06/29/2017 0813   NA 143 03/30/2017 0805   K 4.4 12/24/2019 1501   K 3.8 06/29/2017 0813   K 4.0 03/30/2017 0805   CL 107 12/24/2019 1501   CL 105 06/29/2017 0813   CO2 26 12/24/2019 1501   CO2 28 06/29/2017 0813   CO2 26 03/30/2017 0805   BUN 17 12/24/2019 1501   BUN 21 06/29/2017 0813   BUN 18.2 03/30/2017 0805   CREATININE 1.05 (H) 12/24/2019 1501   CREATININE 1.3 (H) 06/29/2017 0813   CREATININE 1.0 03/30/2017 0805      Component Value Date/Time   CALCIUM 9.3 12/24/2019 1501   CALCIUM 9.5 06/29/2017 0813   CALCIUM 9.6 03/30/2017 0805   ALKPHOS 66 12/24/2019 1501   ALKPHOS 78 06/29/2017 0813   ALKPHOS 73 03/30/2017 0805   AST 26 12/24/2019 1501   AST 29 03/30/2017 0805   ALT 23 12/24/2019 1501   ALT 32 06/29/2017 0813   ALT 26 03/30/2017 0805   BILITOT 0.5 12/24/2019 1501   BILITOT 0.67 03/30/2017 0805       Impression and Plan: Shelby Harrington is a very pleasant 69 yo caucasian female with hemochromatosis, heterozygous for the H63D mutation. Iron studies are pending. We will bring her back in for phlebotomy if needed.  We will plan to see her back in another 3 months.  She will contact our office with any questions or concerns. We can certainly see her sooner if needed.   Laverna Peace, NP 6/22/20213:52 PM

## 2019-12-25 LAB — FERRITIN: Ferritin: 24 ng/mL (ref 11–307)

## 2019-12-25 LAB — IRON AND TIBC
Iron: 133 ug/dL (ref 41–142)
Saturation Ratios: 33 % (ref 21–57)
TIBC: 400 ug/dL (ref 236–444)
UIBC: 267 ug/dL (ref 120–384)

## 2019-12-31 ENCOUNTER — Telehealth: Payer: Self-pay | Admitting: Family

## 2019-12-31 NOTE — Telephone Encounter (Signed)
Appointments scheduled calendar printed & mailed per 6/22 los/ mailed late

## 2020-03-25 ENCOUNTER — Other Ambulatory Visit: Payer: 59

## 2020-03-25 ENCOUNTER — Ambulatory Visit: Payer: 59 | Admitting: Hematology & Oncology

## 2020-03-31 ENCOUNTER — Other Ambulatory Visit: Payer: Self-pay

## 2020-03-31 ENCOUNTER — Encounter: Payer: Self-pay | Admitting: Family

## 2020-03-31 ENCOUNTER — Inpatient Hospital Stay (HOSPITAL_BASED_OUTPATIENT_CLINIC_OR_DEPARTMENT_OTHER): Payer: 59 | Admitting: Family

## 2020-03-31 ENCOUNTER — Inpatient Hospital Stay: Payer: 59 | Attending: Hematology & Oncology

## 2020-03-31 DIAGNOSIS — R5383 Other fatigue: Secondary | ICD-10-CM | POA: Diagnosis not present

## 2020-03-31 DIAGNOSIS — R030 Elevated blood-pressure reading, without diagnosis of hypertension: Secondary | ICD-10-CM | POA: Diagnosis not present

## 2020-03-31 DIAGNOSIS — Z885 Allergy status to narcotic agent status: Secondary | ICD-10-CM | POA: Insufficient documentation

## 2020-03-31 DIAGNOSIS — Z880 Allergy status to penicillin: Secondary | ICD-10-CM | POA: Insufficient documentation

## 2020-03-31 DIAGNOSIS — Z888 Allergy status to other drugs, medicaments and biological substances status: Secondary | ICD-10-CM | POA: Diagnosis not present

## 2020-03-31 DIAGNOSIS — Z7982 Long term (current) use of aspirin: Secondary | ICD-10-CM | POA: Insufficient documentation

## 2020-03-31 DIAGNOSIS — M542 Cervicalgia: Secondary | ICD-10-CM | POA: Diagnosis not present

## 2020-03-31 LAB — CMP (CANCER CENTER ONLY)
ALT: 24 U/L (ref 0–44)
AST: 27 U/L (ref 15–41)
Albumin: 4.3 g/dL (ref 3.5–5.0)
Alkaline Phosphatase: 69 U/L (ref 38–126)
Anion gap: 8 (ref 5–15)
BUN: 23 mg/dL (ref 8–23)
CO2: 28 mmol/L (ref 22–32)
Calcium: 9.9 mg/dL (ref 8.9–10.3)
Chloride: 106 mmol/L (ref 98–111)
Creatinine: 1.15 mg/dL — ABNORMAL HIGH (ref 0.44–1.00)
GFR, Est AFR Am: 57 mL/min — ABNORMAL LOW (ref >60)
GFR, Estimated: 49 mL/min — ABNORMAL LOW (ref >60)
Glucose, Bld: 93 mg/dL (ref 70–99)
Potassium: 4.9 mmol/L (ref 3.5–5.1)
Sodium: 142 mmol/L (ref 135–145)
Total Bilirubin: 0.5 mg/dL (ref 0.3–1.2)
Total Protein: 6.8 g/dL (ref 6.5–8.1)

## 2020-03-31 LAB — CBC WITH DIFFERENTIAL (CANCER CENTER ONLY)
Abs Immature Granulocytes: 0.01 10*3/uL (ref 0.00–0.07)
Basophils Absolute: 0.1 10*3/uL (ref 0.0–0.1)
Basophils Relative: 1 %
Eosinophils Absolute: 0.3 10*3/uL (ref 0.0–0.5)
Eosinophils Relative: 3 %
HCT: 48.3 % — ABNORMAL HIGH (ref 36.0–46.0)
Hemoglobin: 16.5 g/dL — ABNORMAL HIGH (ref 12.0–15.0)
Immature Granulocytes: 0 %
Lymphocytes Relative: 28 %
Lymphs Abs: 2.3 10*3/uL (ref 0.7–4.0)
MCH: 32.9 pg (ref 26.0–34.0)
MCHC: 34.2 g/dL (ref 30.0–36.0)
MCV: 96.2 fL (ref 80.0–100.0)
Monocytes Absolute: 0.9 10*3/uL (ref 0.1–1.0)
Monocytes Relative: 11 %
Neutro Abs: 4.6 10*3/uL (ref 1.7–7.7)
Neutrophils Relative %: 57 %
Platelet Count: 208 10*3/uL (ref 150–400)
RBC: 5.02 MIL/uL (ref 3.87–5.11)
RDW: 12.7 % (ref 11.5–15.5)
WBC Count: 8.1 10*3/uL (ref 4.0–10.5)
nRBC: 0 % (ref 0.0–0.2)

## 2020-03-31 NOTE — Progress Notes (Signed)
Hematology and Oncology Follow Up Visit  Shelby Harrington [id number] 07-29-1950 69 y.o. 03/31/2020   Principle Diagnosis:  Hemochromatosis - Heterozygous H63D mutation  Current Therapy: Aspirin 81 mg PO daily  Phlebotomy to keep ferritin <50 and iron sat <30%   Interim History:  Shelby Harrington is here today for follow-up. She is doing well but has some fatigue at times.  She had an episode a week or so ago where she had high blood pressure and pain in the left side of her neck. She let her PCP know and took a full dose aspirin which helped. She has not had any other issues. BP is 130/89 and HR 60.  Hgb is 16.5, MCV 96, WBC count 8.1 and platelets 208.  No fever, chills, n/v, cough, rash, dizziness, SOB, chest pain, palpitations, abdominal pain or changes in bowel or badder habits.  No tenderness, numbness or tingling in her extremities.  She has puffiness at times over her ankle bones but this seems a little better.  No falls or syncopal episodes to report.   ECOG Performance Status: 1 - Symptomatic but completely ambulatory  Medications:  Allergies as of 03/31/2020      Reactions   Latex Other (See Comments)   Skin blisters   Codeine Rash   Penicillins Rash   Prednisone Palpitations   Tape Rash   Adhesive tape      Medication List       Accurate as of March 31, 2020  3:26 PM. If you have any questions, ask your nurse or doctor.        amLODipine 5 MG tablet Commonly known as: NORVASC Take 5 mg by mouth daily.   aspirin 81 MG tablet Take 81 mg by mouth daily.   CALCIUM PO Take 1 capsule by mouth daily. 1200mg  daily   CENTRUM SILVER PO Take 1 tablet by mouth daily.   Cetirizine HCl 10 MG Caps Commonly known as: ZyrTEC Allergy One at bedtime   clotrimazole-betamethasone cream Commonly known as: LOTRISONE Apply 1 application topically 2 (two) times daily.   cyclobenzaprine 10 MG tablet Commonly known as: FLEXERIL Take 10 mg by mouth at  bedtime as needed for muscle spasms.   fluticasone 50 MCG/ACT nasal spray Commonly known as: FLONASE daily.   GLUCOSAMINE CHONDR COMPLEX PO Take 1 capsule by mouth daily.   HYDROcodone-acetaminophen 5-325 MG tablet Commonly known as: NORCO/VICODIN Take 1 tablet by mouth every 4 (four) hours as needed.   metoprolol succinate 50 MG 24 hr tablet Commonly known as: TOPROL-XL Take 50 mg by mouth daily.       Allergies:  Allergies  Allergen Reactions   Latex Other (See Comments)    Skin blisters   Codeine Rash   Penicillins Rash   Prednisone Palpitations   Tape Rash    Adhesive tape    Past Medical History, Surgical history, Social history, and Family History were reviewed and updated.  Review of Systems: All other 10 point review of systems is negative.   Physical Exam:  vitals were not taken for this visit.   Wt Readings from Last 3 Encounters:  12/24/19 158 lb (71.7 kg)  09/17/19 172 lb 12.8 oz (78.4 kg)  02/26/19 166 lb (75.3 kg)    Ocular: Sclerae unicteric, pupils equal, round and reactive to light Ear-nose-throat: Oropharynx clear, dentition fair Lymphatic: No cervical or supraclavicular adenopathy Lungs no rales or rhonchi, good excursion bilaterally Heart regular rate and rhythm, no murmur appreciated Abd soft, nontender, positive  bowel sounds MSK no focal spinal tenderness, no joint edema Neuro: non-focal, well-oriented, appropriate affect Breasts: Deferred   Lab Results  Component Value Date   WBC 6.5 12/24/2019   HGB 16.5 (H) 12/24/2019   HCT 49.1 (H) 12/24/2019   MCV 93.0 12/24/2019   PLT 163 12/24/2019   Lab Results  Component Value Date   FERRITIN 24 12/24/2019   IRON 133 12/24/2019   TIBC 400 12/24/2019   UIBC 267 12/24/2019   IRONPCTSAT 33 12/24/2019   Lab Results  Component Value Date   RBC 5.28 (H) 12/24/2019   No results found for: KPAFRELGTCHN, LAMBDASER, KAPLAMBRATIO No results found for: Kandis Cocking, IGMSERUM No  results found for: Odetta Pink, SPEI   Chemistry      Component Value Date/Time   NA 141 12/24/2019 1501   NA 148 (H) 06/29/2017 0813   NA 143 03/30/2017 0805   K 4.4 12/24/2019 1501   K 3.8 06/29/2017 0813   K 4.0 03/30/2017 0805   CL 107 12/24/2019 1501   CL 105 06/29/2017 0813   CO2 26 12/24/2019 1501   CO2 28 06/29/2017 0813   CO2 26 03/30/2017 0805   BUN 17 12/24/2019 1501   BUN 21 06/29/2017 0813   BUN 18.2 03/30/2017 0805   CREATININE 1.05 (H) 12/24/2019 1501   CREATININE 1.3 (H) 06/29/2017 0813   CREATININE 1.0 03/30/2017 0805      Component Value Date/Time   CALCIUM 9.3 12/24/2019 1501   CALCIUM 9.5 06/29/2017 0813   CALCIUM 9.6 03/30/2017 0805   ALKPHOS 66 12/24/2019 1501   ALKPHOS 78 06/29/2017 0813   ALKPHOS 73 03/30/2017 0805   AST 26 12/24/2019 1501   AST 29 03/30/2017 0805   ALT 23 12/24/2019 1501   ALT 32 06/29/2017 0813   ALT 26 03/30/2017 0805   BILITOT 0.5 12/24/2019 1501   BILITOT 0.67 03/30/2017 0805       Impression and Plan: Shelby Harrington is a very pleasant 69yo caucasian female with hemochromatosis, heterozygous for the H63D mutation. We will see what her iron studies look like and get her back in for phlebotomy if needed.  Follow-up in 3 months.  She can contact our office with any questions or concerns and will go to the ED in the even of an emergency.   Laverna Peace, NP 9/28/20213:26 PM\

## 2020-04-01 ENCOUNTER — Telehealth: Payer: Self-pay | Admitting: Family

## 2020-04-01 LAB — FERRITIN: Ferritin: 91 ng/mL (ref 11–307)

## 2020-04-01 LAB — IRON AND TIBC
Iron: 96 ug/dL (ref 41–142)
Saturation Ratios: 27 % (ref 21–57)
TIBC: 351 ug/dL (ref 236–444)
UIBC: 255 ug/dL (ref 120–384)

## 2020-04-01 NOTE — Telephone Encounter (Signed)
Appointments scheduled calendar printed & mailed per 9/28 los

## 2020-04-06 ENCOUNTER — Telehealth: Payer: Self-pay | Admitting: Family

## 2020-04-06 NOTE — Telephone Encounter (Signed)
Returned call to patient.  She requested to r/s her Phlebotomy from this week 10/7 to next week 10/12.  She needed late afternoon appt as well.

## 2020-04-07 ENCOUNTER — Inpatient Hospital Stay: Payer: 59

## 2020-04-13 ENCOUNTER — Other Ambulatory Visit: Payer: Self-pay | Admitting: Family Medicine

## 2020-04-13 DIAGNOSIS — Z1231 Encounter for screening mammogram for malignant neoplasm of breast: Secondary | ICD-10-CM

## 2020-04-14 ENCOUNTER — Inpatient Hospital Stay: Payer: 59 | Attending: Family

## 2020-04-14 ENCOUNTER — Other Ambulatory Visit: Payer: Self-pay

## 2020-04-14 MED ORDER — SODIUM CHLORIDE 0.9 % IV SOLN
Freq: Once | INTRAVENOUS | Status: AC
  Filled 2020-04-14: qty 250

## 2020-04-14 NOTE — Patient Instructions (Signed)

## 2020-05-07 ENCOUNTER — Ambulatory Visit
Admission: RE | Admit: 2020-05-07 | Discharge: 2020-05-07 | Disposition: A | Discharge status: Home / Self Care | Payer: 59 | Source: Ambulatory Visit | Attending: Family Medicine | Admitting: Family Medicine

## 2020-05-07 ENCOUNTER — Other Ambulatory Visit: Payer: Self-pay

## 2020-05-07 DIAGNOSIS — Z1231 Encounter for screening mammogram for malignant neoplasm of breast: Secondary | ICD-10-CM

## 2020-06-30 ENCOUNTER — Inpatient Hospital Stay: Payer: Managed Care, Other (non HMO)

## 2020-06-30 ENCOUNTER — Inpatient Hospital Stay: Payer: Managed Care, Other (non HMO) | Admitting: Family

## 2020-07-10 ENCOUNTER — Other Ambulatory Visit: Payer: Self-pay

## 2020-07-10 ENCOUNTER — Inpatient Hospital Stay (HOSPITAL_BASED_OUTPATIENT_CLINIC_OR_DEPARTMENT_OTHER): Payer: Managed Care, Other (non HMO) | Admitting: Family

## 2020-07-10 ENCOUNTER — Inpatient Hospital Stay: Payer: Managed Care, Other (non HMO) | Attending: Family

## 2020-07-10 ENCOUNTER — Telehealth: Payer: Self-pay

## 2020-07-10 ENCOUNTER — Encounter: Payer: Self-pay | Admitting: Family

## 2020-07-10 ENCOUNTER — Telehealth: Payer: Self-pay | Admitting: Family

## 2020-07-10 DIAGNOSIS — R0602 Shortness of breath: Secondary | ICD-10-CM | POA: Insufficient documentation

## 2020-07-10 DIAGNOSIS — Z79899 Other long term (current) drug therapy: Secondary | ICD-10-CM | POA: Insufficient documentation

## 2020-07-10 DIAGNOSIS — Z880 Allergy status to penicillin: Secondary | ICD-10-CM | POA: Diagnosis not present

## 2020-07-10 DIAGNOSIS — Z7982 Long term (current) use of aspirin: Secondary | ICD-10-CM | POA: Insufficient documentation

## 2020-07-10 DIAGNOSIS — Z885 Allergy status to narcotic agent status: Secondary | ICD-10-CM | POA: Insufficient documentation

## 2020-07-10 DIAGNOSIS — R5383 Other fatigue: Secondary | ICD-10-CM | POA: Diagnosis not present

## 2020-07-10 DIAGNOSIS — R200 Anesthesia of skin: Secondary | ICD-10-CM | POA: Diagnosis not present

## 2020-07-10 DIAGNOSIS — R202 Paresthesia of skin: Secondary | ICD-10-CM | POA: Insufficient documentation

## 2020-07-10 DIAGNOSIS — Z888 Allergy status to other drugs, medicaments and biological substances status: Secondary | ICD-10-CM | POA: Diagnosis not present

## 2020-07-10 LAB — CBC WITH DIFFERENTIAL (CANCER CENTER ONLY)
Abs Immature Granulocytes: 0.02 10*3/uL (ref 0.00–0.07)
Basophils Absolute: 0.1 10*3/uL (ref 0.0–0.1)
Basophils Relative: 1 %
Eosinophils Absolute: 0.2 10*3/uL (ref 0.0–0.5)
Eosinophils Relative: 3 %
HCT: 50 % — ABNORMAL HIGH (ref 36.0–46.0)
Hemoglobin: 16.8 g/dL — ABNORMAL HIGH (ref 12.0–15.0)
Immature Granulocytes: 0 %
Lymphocytes Relative: 33 %
Lymphs Abs: 2 10*3/uL (ref 0.7–4.0)
MCH: 31.6 pg (ref 26.0–34.0)
MCHC: 33.6 g/dL (ref 30.0–36.0)
MCV: 94.2 fL (ref 80.0–100.0)
Monocytes Absolute: 0.6 10*3/uL (ref 0.1–1.0)
Monocytes Relative: 10 %
Neutro Abs: 3.3 10*3/uL (ref 1.7–7.7)
Neutrophils Relative %: 53 %
Platelet Count: 200 10*3/uL (ref 150–400)
RBC: 5.31 MIL/uL — ABNORMAL HIGH (ref 3.87–5.11)
RDW: 12.2 % (ref 11.5–15.5)
WBC Count: 6.2 10*3/uL (ref 4.0–10.5)
nRBC: 0 % (ref 0.0–0.2)

## 2020-07-10 LAB — CMP (CANCER CENTER ONLY)
ALT: 20 U/L (ref 0–44)
AST: 23 U/L (ref 15–41)
Albumin: 4.5 g/dL (ref 3.5–5.0)
Alkaline Phosphatase: 76 U/L (ref 38–126)
Anion gap: 8 (ref 5–15)
BUN: 16 mg/dL (ref 8–23)
CO2: 29 mmol/L (ref 22–32)
Calcium: 9.9 mg/dL (ref 8.9–10.3)
Chloride: 105 mmol/L (ref 98–111)
Creatinine: 1.08 mg/dL — ABNORMAL HIGH (ref 0.44–1.00)
GFR, Estimated: 56 mL/min — ABNORMAL LOW (ref >60)
Glucose, Bld: 99 mg/dL (ref 70–99)
Potassium: 4 mmol/L (ref 3.5–5.1)
Sodium: 142 mmol/L (ref 135–145)
Total Bilirubin: 0.5 mg/dL (ref 0.3–1.2)
Total Protein: 7.1 g/dL (ref 6.5–8.1)

## 2020-07-10 LAB — FERRITIN: Ferritin: 26 ng/mL (ref 11–307)

## 2020-07-10 LAB — IRON AND TIBC
Iron: 147 ug/dL — ABNORMAL HIGH (ref 41–142)
Saturation Ratios: 33 % (ref 21–57)
TIBC: 446 ug/dL — ABNORMAL HIGH (ref 236–444)
UIBC: 299 ug/dL (ref 120–384)

## 2020-07-10 NOTE — Telephone Encounter (Signed)
Appointments scheduled patient has My Chart Access per 1/7 los 

## 2020-07-10 NOTE — Progress Notes (Signed)
Hematology and Oncology Follow Up Visit  Shelby Harrington [id number] Feb 03, 1951 70 y.o. 07/10/2020   Principle Diagnosis:  Hemochromatosis - Heterozygous H63D mutation  Current Therapy: Aspirin 81 mg PO daily  Phlebotomy to keep ferritin <50 and iron sat <30%   Interim History:  Shelby Harrington is here today for follow-up. She is doing well but does notice occasional episodes of mild SOB with exertion (stairs). She takes a break to rest when needed.  She has not noted any blood loss. She bruises easily but not in excess. No petechiae.  She notes some mild fatigue at times.  No fever, chills, n/v, cough, rash, dizziness, SOB, chest pain, palpitations, abdominal pain or changes in bowel or bladder habits.  She has occasional numbness and tingling in her fingers which she feels may possibly be positional.  She has puffiness in the joints of her fingers that comes and goes.  No falls or syncope.  She has maintained a good appetite and is staying well hydrated. Her weight is stable.   ECOG Performance Status: 1 - Symptomatic but completely ambulatory  Medications:  Allergies as of 07/10/2020      Reactions   Latex Other (See Comments)   Skin blisters   Codeine Rash   Penicillins Rash   Prednisone Palpitations   Tape Rash   Adhesive tape      Medication List       Accurate as of July 10, 2020 10:11 AM. If you have any questions, ask your nurse or doctor.        amLODipine 5 MG tablet Commonly known as: NORVASC Take 5 mg by mouth daily.   aspirin 81 MG tablet Take 81 mg by mouth daily.   CALCIUM PO Take 1 capsule by mouth daily. 1200mg  daily   CENTRUM SILVER PO Take 1 tablet by mouth daily.   Cetirizine HCl 10 MG Caps Commonly known as: ZyrTEC Allergy One at bedtime   clotrimazole-betamethasone cream Commonly known as: LOTRISONE Apply 1 application topically 2 (two) times daily.   cyclobenzaprine 10 MG tablet Commonly known as: FLEXERIL Take  10 mg by mouth at bedtime as needed for muscle spasms.   fluticasone 50 MCG/ACT nasal spray Commonly known as: FLONASE daily.   GLUCOSAMINE CHONDR COMPLEX PO Take 1 capsule by mouth daily.   HYDROcodone-acetaminophen 5-325 MG tablet Commonly known as: NORCO/VICODIN Take 1 tablet by mouth every 4 (four) hours as needed.   magnesium gluconate 500 MG tablet Commonly known as: MAGONATE Take 500 mg by mouth 2 (two) times daily.   metoprolol succinate 50 MG 24 hr tablet Commonly known as: TOPROL-XL Take 50 mg by mouth daily.       Allergies:  Allergies  Allergen Reactions  . Latex Other (See Comments)    Skin blisters  . Codeine Rash  . Penicillins Rash  . Prednisone Palpitations  . Tape Rash    Adhesive tape    Past Medical History, Surgical history, Social history, and Family History were reviewed and updated.  Review of Systems: All other 10 point review of systems is negative.   Physical Exam:  height is 4\' 11"  (1.499 m) and weight is 143 lb 12.8 oz (65.2 kg). Her oral temperature is 97.8 F (36.6 C). Her blood pressure is 130/72 and her pulse is 63. Her respiration is 16 and oxygen saturation is 99%.   Wt Readings from Last 3 Encounters:  07/10/20 143 lb 12.8 oz (65.2 kg)  03/31/20 146 lb (66.2 kg)  12/24/19  158 lb (71.7 kg)    Ocular: Sclerae unicteric, pupils equal, round and reactive to light Ear-nose-throat: Oropharynx clear, dentition fair Lymphatic: No cervical or supraclavicular adenopathy Lungs no rales or rhonchi, good excursion bilaterally Heart regular rate and rhythm, no murmur appreciated Abd soft, nontender, positive bowel sounds MSK no focal spinal tenderness, no joint edema Neuro: non-focal, well-oriented, appropriate affect Breasts: Deferred   Lab Results  Component Value Date   WBC 6.2 07/10/2020   HGB 16.8 (H) 07/10/2020   HCT 50.0 (H) 07/10/2020   MCV 94.2 07/10/2020   PLT 200 07/10/2020   Lab Results  Component Value Date    FERRITIN 91 03/31/2020   IRON 96 03/31/2020   TIBC 351 03/31/2020   UIBC 255 03/31/2020   IRONPCTSAT 27 03/31/2020   Lab Results  Component Value Date   RBC 5.31 (H) 07/10/2020   No results found for: KPAFRELGTCHN, LAMBDASER, KAPLAMBRATIO No results found for: IGGSERUM, IGA, IGMSERUM No results found for: Shelby Harrington, SPEI   Chemistry      Component Value Date/Time   NA 142 03/31/2020 1514   NA 148 (H) 06/29/2017 0813   NA 143 03/30/2017 0805   K 4.9 03/31/2020 1514   K 3.8 06/29/2017 0813   K 4.0 03/30/2017 0805   CL 106 03/31/2020 1514   CL 105 06/29/2017 0813   CO2 28 03/31/2020 1514   CO2 28 06/29/2017 0813   CO2 26 03/30/2017 0805   BUN 23 03/31/2020 1514   BUN 21 06/29/2017 0813   BUN 18.2 03/30/2017 0805   CREATININE 1.15 (H) 03/31/2020 1514   CREATININE 1.3 (H) 06/29/2017 0813   CREATININE 1.0 03/30/2017 0805      Component Value Date/Time   CALCIUM 9.9 03/31/2020 1514   CALCIUM 9.5 06/29/2017 0813   CALCIUM 9.6 03/30/2017 0805   ALKPHOS 69 03/31/2020 1514   ALKPHOS 78 06/29/2017 0813   ALKPHOS 73 03/30/2017 0805   AST 27 03/31/2020 1514   AST 29 03/30/2017 0805   ALT 24 03/31/2020 1514   ALT 32 06/29/2017 0813   ALT 26 03/30/2017 0805   BILITOT 0.5 03/31/2020 1514   BILITOT 0.67 03/30/2017 0805       Impression and Plan: Shelby Harrington is a very pleasant 70yo caucasian female with hemochromatosis, heterozygous for the H63D mutation. Iron studies are pending. We will set her up for phlebotomy if needed.  Lab in 3 months and follow-up in 6 months.  She was encouraged to contact our office with any questions or concerns. We can certainly see her sooner if needed.   Laverna Peace, NP 1/7/202210:11 AM

## 2020-07-10 NOTE — Telephone Encounter (Signed)
Called and informed patient of lab results and no phlebotomy needed, patient verbalized understanding and very appreciative. She denies any questions or concerns at this time.

## 2020-07-10 NOTE — Telephone Encounter (Signed)
-----   Message from Eliezer Bottom, NP sent at 07/10/2020  2:28 PM EST ----- Ferritin 26 and iron saturation 33%! No phlebotomy needed this time! [place]!  Sarah  ----- Message ----- From: Buel Ream, Lab In [place] Sent: 07/10/2020  10:04 AM EST To: Eliezer Bottom, NP

## 2020-07-30 ENCOUNTER — Other Ambulatory Visit: Payer: Self-pay | Admitting: Obstetrics and Gynecology

## 2020-07-30 DIAGNOSIS — M8580 Other specified disorders of bone density and structure, unspecified site: Secondary | ICD-10-CM

## 2020-09-30 ENCOUNTER — Telehealth: Payer: Self-pay

## 2020-09-30 NOTE — Telephone Encounter (Signed)
S/w pt and r/s her 01/07/21 appt as [name] will be out of the office, pt also out of town most of the month    Shelby Harrington

## 2020-10-05 ENCOUNTER — Telehealth: Payer: Self-pay

## 2020-10-05 NOTE — Telephone Encounter (Signed)
Pt called into r/s her 4/7 alb to 4/19    Shelby Harrington

## 2020-10-07 DIAGNOSIS — I129 Hypertensive chronic kidney disease with stage 1 through stage 4 chronic kidney disease, or unspecified chronic kidney disease: Secondary | ICD-10-CM | POA: Diagnosis not present

## 2020-10-07 DIAGNOSIS — J3089 Other allergic rhinitis: Secondary | ICD-10-CM | POA: Diagnosis not present

## 2020-10-07 DIAGNOSIS — M545 Low back pain, unspecified: Secondary | ICD-10-CM | POA: Diagnosis not present

## 2020-10-07 DIAGNOSIS — N183 Chronic kidney disease, stage 3 unspecified: Secondary | ICD-10-CM | POA: Diagnosis not present

## 2020-10-07 DIAGNOSIS — E559 Vitamin D deficiency, unspecified: Secondary | ICD-10-CM | POA: Diagnosis not present

## 2020-10-07 DIAGNOSIS — Z1159 Encounter for screening for other viral diseases: Secondary | ICD-10-CM | POA: Diagnosis not present

## 2020-10-07 DIAGNOSIS — Z0000 Encounter for general adult medical examination without abnormal findings: Secondary | ICD-10-CM | POA: Diagnosis not present

## 2020-10-08 ENCOUNTER — Other Ambulatory Visit: Payer: 59

## 2020-10-20 ENCOUNTER — Other Ambulatory Visit: Payer: Self-pay

## 2020-10-20 ENCOUNTER — Inpatient Hospital Stay: Payer: Managed Care, Other (non HMO) | Attending: Hematology & Oncology

## 2020-10-20 DIAGNOSIS — R0602 Shortness of breath: Secondary | ICD-10-CM | POA: Diagnosis not present

## 2020-10-20 DIAGNOSIS — Z79899 Other long term (current) drug therapy: Secondary | ICD-10-CM | POA: Insufficient documentation

## 2020-10-20 LAB — CMP (CANCER CENTER ONLY)
ALT: 27 U/L (ref 0–44)
AST: 28 U/L (ref 15–41)
Albumin: 4.2 g/dL (ref 3.5–5.0)
Alkaline Phosphatase: 86 U/L (ref 38–126)
Anion gap: 8 (ref 5–15)
BUN: 22 mg/dL (ref 8–23)
CO2: 30 mmol/L (ref 22–32)
Calcium: 9.8 mg/dL (ref 8.9–10.3)
Chloride: 104 mmol/L (ref 98–111)
Creatinine: 1.15 mg/dL — ABNORMAL HIGH (ref 0.44–1.00)
GFR, Estimated: 52 mL/min — ABNORMAL LOW (ref >60)
Glucose, Bld: 112 mg/dL — ABNORMAL HIGH (ref 70–99)
Potassium: 4.5 mmol/L (ref 3.5–5.1)
Sodium: 142 mmol/L (ref 135–145)
Total Bilirubin: 0.6 mg/dL (ref 0.3–1.2)
Total Protein: 7.1 g/dL (ref 6.5–8.1)

## 2020-10-20 LAB — CBC WITH DIFFERENTIAL (CANCER CENTER ONLY)
Abs Immature Granulocytes: 0.02 10*3/uL (ref 0.00–0.07)
Basophils Absolute: 0.1 10*3/uL (ref 0.0–0.1)
Basophils Relative: 1 %
Eosinophils Absolute: 0.3 10*3/uL (ref 0.0–0.5)
Eosinophils Relative: 4 %
HCT: 47.5 % — ABNORMAL HIGH (ref 36.0–46.0)
Hemoglobin: 16.1 g/dL — ABNORMAL HIGH (ref 12.0–15.0)
Immature Granulocytes: 0 %
Lymphocytes Relative: 37 %
Lymphs Abs: 2.2 10*3/uL (ref 0.7–4.0)
MCH: 31.6 pg (ref 26.0–34.0)
MCHC: 33.9 g/dL (ref 30.0–36.0)
MCV: 93.3 fL (ref 80.0–100.0)
Monocytes Absolute: 0.6 10*3/uL (ref 0.1–1.0)
Monocytes Relative: 10 %
Neutro Abs: 2.8 10*3/uL (ref 1.7–7.7)
Neutrophils Relative %: 48 %
Platelet Count: 230 10*3/uL (ref 150–400)
RBC: 5.09 MIL/uL (ref 3.87–5.11)
RDW: 12.7 % (ref 11.5–15.5)
WBC Count: 6 10*3/uL (ref 4.0–10.5)
nRBC: 0 % (ref 0.0–0.2)

## 2020-10-20 LAB — IRON AND TIBC
Iron: 166 ug/dL — ABNORMAL HIGH (ref 41–142)
Saturation Ratios: 43 % (ref 21–57)
TIBC: 388 ug/dL (ref 236–444)
UIBC: 222 ug/dL (ref 120–384)

## 2020-10-20 LAB — FERRITIN: Ferritin: 56 ng/mL (ref 11–307)

## 2020-10-21 NOTE — Progress Notes (Unsigned)
Per  Np, pt will need partial phlebotomy at next visit. No appt scheduled at this.

## 2020-10-22 ENCOUNTER — Telehealth: Payer: Self-pay

## 2020-10-22 NOTE — Telephone Encounter (Signed)
Called and left a vm per los/sch message for partial phlebot   Quaid Yeakle

## 2020-10-22 NOTE — Telephone Encounter (Signed)
Returned pts call top r/s the phlebot appt to next week   Shelby Harrington

## 2020-10-29 ENCOUNTER — Other Ambulatory Visit: Payer: Self-pay

## 2020-10-29 ENCOUNTER — Inpatient Hospital Stay: Payer: Managed Care, Other (non HMO)

## 2020-10-29 MED ORDER — SODIUM CHLORIDE 0.9 % IV SOLN
Freq: Once | INTRAVENOUS | Status: AC
  Filled 2020-10-29: qty 250

## 2020-10-29 NOTE — Patient Instructions (Signed)
Therapeutic Phlebotomy Therapeutic phlebotomy is the planned removal of blood from a person's body for the purpose of treating a medical condition. The procedure is similar to donating blood. Usually, about a pint (470 mL, or 0.47 L) of blood is removed. The average adult has 9-12 pints (4.3-5.7 L) of blood in the body. Therapeutic phlebotomy may be used to treat the following medical conditions:  Hemochromatosis. This is a condition in which the blood contains too much iron.  Polycythemia vera. This is a condition in which the blood contains too many red blood cells.  Porphyria cutanea tarda. This is a disease in which an important part of hemoglobin is not made properly. It results in the buildup of abnormal amounts of porphyrins in the body.  Sickle cell disease. This is a condition in which the red blood cells form an abnormal crescent shape rather than a round shape. Tell a health care provider about:  Any allergies you have.  All medicines you are taking, including vitamins, herbs, eye drops, creams, and over-the-counter medicines.  Any problems you or family members have had with anesthetic medicines.  Any blood disorders you have.  Any surgeries you have had.  Any medical conditions you have.  Whether you are pregnant or may be pregnant. What are the risks? Generally, this is a safe procedure. However, problems may occur, including:  Nausea or light-headedness.  Low blood pressure (hypotension).  Soreness, bleeding, swelling, or bruising at the needle insertion site.  Infection. What happens before the procedure?  Follow instructions from your health care provider about eating or drinking restrictions.  Ask your health care provider about: ? Changing or stopping your regular medicines. This is especially important if you are taking diabetes medicines or blood thinners (anticoagulants). ? Taking medicines such as aspirin and ibuprofen. These medicines can thin your  blood. Do not take these medicines unless your health care provider tells you to take them. ? Taking over-the-counter medicines, vitamins, herbs, and supplements.  Wear clothing with sleeves that can be raised above the elbow.  Plan to have someone take you home from the hospital or clinic.  You may have a blood sample taken.  Your blood pressure, pulse rate, and breathing rate will be measured. What happens during the procedure?  To lower your risk of infection: ? Your health care team will wash or sanitize their hands. ? Your skin will be cleaned with an antiseptic.  You may be given a medicine to numb the area (local anesthetic).  A tourniquet will be placed on your arm.  A needle will be inserted into one of your veins.  Tubing and a collection bag will be attached to that needle.  Blood will flow through the needle and tubing into the collection bag.  The collection bag will be placed lower than your arm to allow gravity to help the flow of blood into the bag.  You may be asked to open and close your hand slowly and continually during the entire collection.  After the specified amount of blood has been removed from your body, the collection bag and tubing will be clamped.  The needle will be removed from your vein.  Pressure will be held on the site of the needle insertion to stop the bleeding.  A bandage (dressing) will be placed over the needle insertion site. The procedure may vary among health care providers and hospitals.   What happens after the procedure?  Your blood pressure, pulse rate, and breathing rate will   be measured after the procedure.  You will be encouraged to drink fluids.  Your recovery will be assessed and monitored.  You can return to your normal activities as told by your health care provider. Summary  Therapeutic phlebotomy is the planned removal of blood from a person's body for the purpose of treating a medical condition.  Therapeutic  phlebotomy may be used to treat hemochromatosis, polycythemia vera, porphyria cutanea tarda, or sickle cell disease.  In the procedure, a needle is inserted and about a pint (470 mL, or 0.47 L) of blood is removed. The average adult has 9-12 pints (4.3-5.7 L) of blood in the body.  This is generally a safe procedure, but it can sometimes cause problems such as nausea, light-headedness, or low blood pressure (hypotension). This information is not intended to replace advice given to you by your health care provider. Make sure you discuss any questions you have with your health care provider. Document Revised: 07/06/2017 Document Reviewed: 07/06/2017 Elsevier Patient Education  2021 [employer]  

## 2020-12-02 ENCOUNTER — Encounter: Payer: Self-pay | Admitting: Family

## 2020-12-09 ENCOUNTER — Encounter: Payer: Self-pay | Admitting: Family

## 2020-12-09 ENCOUNTER — Ambulatory Visit
Admission: RE | Admit: 2020-12-09 | Discharge: 2020-12-09 | Disposition: A | Discharge status: Home / Self Care | Payer: PPO | Source: Ambulatory Visit | Attending: Obstetrics and Gynecology | Admitting: Obstetrics and Gynecology

## 2020-12-09 ENCOUNTER — Other Ambulatory Visit: Payer: Self-pay

## 2020-12-09 DIAGNOSIS — M8580 Other specified disorders of bone density and structure, unspecified site: Secondary | ICD-10-CM

## 2020-12-09 DIAGNOSIS — Z780 Asymptomatic menopausal state: Secondary | ICD-10-CM | POA: Diagnosis not present

## 2020-12-09 DIAGNOSIS — M8589 Other specified disorders of bone density and structure, multiple sites: Secondary | ICD-10-CM | POA: Diagnosis not present

## 2020-12-17 ENCOUNTER — Encounter: Payer: Self-pay | Admitting: Family

## 2021-01-07 ENCOUNTER — Ambulatory Visit: Payer: 59 | Admitting: Family

## 2021-01-07 ENCOUNTER — Other Ambulatory Visit: Payer: 59

## 2021-01-07 DIAGNOSIS — Z23 Encounter for immunization: Secondary | ICD-10-CM | POA: Diagnosis not present

## 2021-01-20 ENCOUNTER — Encounter: Payer: Self-pay | Admitting: Family

## 2021-01-27 ENCOUNTER — Inpatient Hospital Stay: Payer: PPO | Attending: Hematology & Oncology

## 2021-01-27 ENCOUNTER — Telehealth: Payer: Self-pay | Admitting: *Deleted

## 2021-01-27 ENCOUNTER — Other Ambulatory Visit: Payer: Self-pay

## 2021-01-27 ENCOUNTER — Encounter: Payer: Self-pay | Admitting: Family

## 2021-01-27 ENCOUNTER — Inpatient Hospital Stay (HOSPITAL_BASED_OUTPATIENT_CLINIC_OR_DEPARTMENT_OTHER): Payer: PPO | Admitting: Family

## 2021-01-27 DIAGNOSIS — Z888 Allergy status to other drugs, medicaments and biological substances status: Secondary | ICD-10-CM | POA: Diagnosis not present

## 2021-01-27 DIAGNOSIS — Z885 Allergy status to narcotic agent status: Secondary | ICD-10-CM | POA: Insufficient documentation

## 2021-01-27 DIAGNOSIS — R630 Anorexia: Secondary | ICD-10-CM | POA: Diagnosis not present

## 2021-01-27 DIAGNOSIS — R197 Diarrhea, unspecified: Secondary | ICD-10-CM | POA: Diagnosis not present

## 2021-01-27 DIAGNOSIS — Z880 Allergy status to penicillin: Secondary | ICD-10-CM | POA: Insufficient documentation

## 2021-01-27 DIAGNOSIS — R5383 Other fatigue: Secondary | ICD-10-CM | POA: Diagnosis not present

## 2021-01-27 DIAGNOSIS — Z7982 Long term (current) use of aspirin: Secondary | ICD-10-CM | POA: Diagnosis not present

## 2021-01-27 DIAGNOSIS — R200 Anesthesia of skin: Secondary | ICD-10-CM | POA: Insufficient documentation

## 2021-01-27 DIAGNOSIS — R202 Paresthesia of skin: Secondary | ICD-10-CM | POA: Insufficient documentation

## 2021-01-27 LAB — CMP (CANCER CENTER ONLY)
ALT: 35 U/L (ref 0–44)
AST: 30 U/L (ref 15–41)
Albumin: 4.3 g/dL (ref 3.5–5.0)
Alkaline Phosphatase: 88 U/L (ref 38–126)
Anion gap: 7 (ref 5–15)
BUN: 15 mg/dL (ref 8–23)
CO2: 26 mmol/L (ref 22–32)
Calcium: 9.4 mg/dL (ref 8.9–10.3)
Chloride: 106 mmol/L (ref 98–111)
Creatinine: 1.29 mg/dL — ABNORMAL HIGH (ref 0.44–1.00)
GFR, Estimated: 45 mL/min — ABNORMAL LOW (ref >60)
Glucose, Bld: 101 mg/dL — ABNORMAL HIGH (ref 70–99)
Potassium: 4.4 mmol/L (ref 3.5–5.1)
Sodium: 139 mmol/L (ref 135–145)
Total Bilirubin: 0.5 mg/dL (ref 0.3–1.2)
Total Protein: 7 g/dL (ref 6.5–8.1)

## 2021-01-27 LAB — CBC WITH DIFFERENTIAL (CANCER CENTER ONLY)
Abs Immature Granulocytes: 0.02 10*3/uL (ref 0.00–0.07)
Basophils Absolute: 0 10*3/uL (ref 0.0–0.1)
Basophils Relative: 1 %
Eosinophils Absolute: 0.2 10*3/uL (ref 0.0–0.5)
Eosinophils Relative: 3 %
HCT: 50.2 % — ABNORMAL HIGH (ref 36.0–46.0)
Hemoglobin: 17 g/dL — ABNORMAL HIGH (ref 12.0–15.0)
Immature Granulocytes: 0 %
Lymphocytes Relative: 30 %
Lymphs Abs: 1.8 10*3/uL (ref 0.7–4.0)
MCH: 31.7 pg (ref 26.0–34.0)
MCHC: 33.9 g/dL (ref 30.0–36.0)
MCV: 93.5 fL (ref 80.0–100.0)
Monocytes Absolute: 0.6 10*3/uL (ref 0.1–1.0)
Monocytes Relative: 11 %
Neutro Abs: 3.3 10*3/uL (ref 1.7–7.7)
Neutrophils Relative %: 55 %
Platelet Count: 195 10*3/uL (ref 150–400)
RBC: 5.37 MIL/uL — ABNORMAL HIGH (ref 3.87–5.11)
RDW: 12.6 % (ref 11.5–15.5)
WBC Count: 5.9 10*3/uL (ref 4.0–10.5)
nRBC: 0 % (ref 0.0–0.2)

## 2021-01-27 LAB — FERRITIN: Ferritin: 56 ng/mL (ref 11–307)

## 2021-01-27 LAB — IRON AND TIBC
Iron: 109 ug/dL (ref 41–142)
Saturation Ratios: 26 % (ref 21–57)
TIBC: 410 ug/dL (ref 236–444)
UIBC: 301 ug/dL (ref 120–384)

## 2021-01-27 NOTE — Telephone Encounter (Signed)
Pt notified of results. No concerns at this time. 

## 2021-01-27 NOTE — Telephone Encounter (Signed)
-----   Message from Eliezer Bottom, NP sent at 01/27/2021  3:27 PM EDT ----- No phlebotomy needed at this time! Iron numbers looking good!   ----- Message ----- From: Interface, Lab In [place] Sent: 01/27/2021  10:23 AM EDT To: Eliezer Bottom, NP

## 2021-01-27 NOTE — Progress Notes (Signed)
Hematology and Oncology Follow Up Visit  JEFFIE CLARA [id number] [birth year] 70 y.o. 01/27/2021   Principle Diagnosis:  Hemochromatosis - Heterozygous H63D mutation   Current Therapy:        Aspirin 81 mg PO daily Phlebotomy to keep ferritin < 50 and iron sat < 30%    Interim History:  Ms. Blandin is here today for follow-up. She is fatigued and states that she is getting over a cold. She had travelled out [place] to the [place] to watch her daughter on an archeological dig which was absolutely fascinating. \Hgb is 17.0, MCV 93, platelets 195 and WBC count 5.9.  No fever, chills, n/v, cough, rash, dizziness, SOB, chest pain, palpitations or changes in bladder habits. She still has a little diarrhea and loss of appetite. She is doing her best to stay well hydrated. Her weight is stable at 152 lbs.  No swelling or tenderness in her extremities.  She has numbness and tingling in some of her fingertips. This has not effected her dexterity and she will follow-up with her PCP if it becomes an issue.  No falls or syncope to report.  No blood loss noted. No bruising or petechiae.   ECOG Performance Status: 1 - Symptomatic but completely ambulatory  Medications:  Allergies as of 01/27/2021       Reactions   Latex Other (See Comments)   Skin blisters   Codeine Rash   Penicillins Rash   Prednisone Palpitations   Tape Rash   Adhesive tape        Medication List        Accurate as of January 27, 2021 10:35 AM. If you have any questions, ask your nurse or doctor.          amLODipine 5 MG tablet Commonly known as: NORVASC Take 5 mg by mouth daily.   aspirin 81 MG tablet Take 81 mg by mouth daily.   CALCIUM PO Take 1 capsule by mouth daily. '1200mg'$  daily   CENTRUM SILVER PO Take 1 tablet by mouth daily.   Cetirizine HCl 10 MG Caps Commonly known as: ZyrTEC Allergy One at bedtime   clotrimazole-betamethasone cream Commonly known as: LOTRISONE Apply 1 application  topically 2 (two) times daily.   cyclobenzaprine 10 MG tablet Commonly known as: FLEXERIL Take 10 mg by mouth at bedtime as needed for muscle spasms.   fluticasone 50 MCG/ACT nasal spray Commonly known as: FLONASE daily.   GLUCOSAMINE CHONDR COMPLEX PO Take 1 capsule by mouth daily.   HYDROcodone-acetaminophen 5-325 MG tablet Commonly known as: NORCO/VICODIN Take 1 tablet by mouth every 4 (four) hours as needed.   magnesium gluconate 500 MG tablet Commonly known as: MAGONATE Take 500 mg by mouth 2 (two) times daily.   metoprolol succinate 50 MG 24 hr tablet Commonly known as: TOPROL-XL Take 50 mg by mouth daily.        Allergies:  Allergies  Allergen Reactions   Latex Other (See Comments)    Skin blisters   Codeine Rash   Penicillins Rash   Prednisone Palpitations   Tape Rash    Adhesive tape    Past Medical History, Surgical history, Social history, and Family History were reviewed and updated.  Review of Systems: All other 10 point review of systems is negative.   Physical Exam:  vitals were not taken for this visit.   Wt Readings from Last 3 Encounters:  07/10/20 143 lb 12.8 oz (65.2 kg)  03/31/20 146 lb (66.2 kg)  12/24/19  158 lb (71.7 kg)    Ocular: Sclerae unicteric, pupils equal, round and reactive to light Ear-nose-throat: Oropharynx clear, dentition fair Lymphatic: No cervical or supraclavicular adenopathy Lungs no rales or rhonchi, good excursion bilaterally Heart regular rate and rhythm, no murmur appreciated Abd soft, nontender, positive bowel sounds MSK no focal spinal tenderness, no joint edema Neuro: non-focal, well-oriented, appropriate affect Breasts: Deferred   Lab Results  Component Value Date   WBC 5.9 01/27/2021   HGB 17.0 (H) 01/27/2021   HCT 50.2 (H) 01/27/2021   MCV 93.5 01/27/2021   PLT 195 01/27/2021   Lab Results  Component Value Date   FERRITIN 56 10/20/2020   IRON 166 (H) 10/20/2020   TIBC 388 10/20/2020   UIBC  222 10/20/2020   IRONPCTSAT 43 10/20/2020   Lab Results  Component Value Date   RBC 5.37 (H) 01/27/2021   No results found for: KPAFRELGTCHN, LAMBDASER, KAPLAMBRATIO No results found for: IGGSERUM, IGA, IGMSERUM No results found for: Odetta Pink, SPEI   Chemistry      Component Value Date/Time   NA 142 10/20/2020 0926   NA 148 (H) 06/29/2017 0813   NA 143 03/30/2017 0805   K 4.5 10/20/2020 0926   K 3.8 06/29/2017 0813   K 4.0 03/30/2017 0805   CL 104 10/20/2020 0926   CL 105 06/29/2017 0813   CO2 30 10/20/2020 0926   CO2 28 06/29/2017 0813   CO2 26 03/30/2017 0805   BUN 22 10/20/2020 0926   BUN 21 06/29/2017 0813   BUN 18.2 03/30/2017 0805   CREATININE 1.15 (H) 10/20/2020 0926   CREATININE 1.3 (H) 06/29/2017 0813   CREATININE 1.0 03/30/2017 0805      Component Value Date/Time   CALCIUM 9.8 10/20/2020 0926   CALCIUM 9.5 06/29/2017 0813   CALCIUM 9.6 03/30/2017 0805   ALKPHOS 86 10/20/2020 0926   ALKPHOS 78 06/29/2017 0813   ALKPHOS 73 03/30/2017 0805   AST 28 10/20/2020 0926   AST 29 03/30/2017 0805   ALT 27 10/20/2020 0926   ALT 32 06/29/2017 0813   ALT 26 03/30/2017 0805   BILITOT 0.6 10/20/2020 0926   BILITOT 0.67 03/30/2017 0805       Impression and Plan: Ms. Hammock is a very pleasant 70 yo caucasian female with hemochromatosis, heterozygous for the H63D mutation.  Iron studies are pending. We will set her up for phlebotomy if needed.  Lab only in 3 months and follow-up in 6 months.  She can contact our office with any questions or concerns.   Laverna Peace, NP 7/27/202210:35 AM

## 2021-01-27 NOTE — Telephone Encounter (Signed)
Per 01/27/21 los - gave patient upcoming appointments - confirmed and print calendar

## 2021-01-29 DIAGNOSIS — D2271 Melanocytic nevi of right lower limb, including hip: Secondary | ICD-10-CM | POA: Diagnosis not present

## 2021-01-29 DIAGNOSIS — D1801 Hemangioma of skin and subcutaneous tissue: Secondary | ICD-10-CM | POA: Diagnosis not present

## 2021-01-29 DIAGNOSIS — L821 Other seborrheic keratosis: Secondary | ICD-10-CM | POA: Diagnosis not present

## 2021-01-29 DIAGNOSIS — L814 Other melanin hyperpigmentation: Secondary | ICD-10-CM | POA: Diagnosis not present

## 2021-01-29 DIAGNOSIS — D225 Melanocytic nevi of trunk: Secondary | ICD-10-CM | POA: Diagnosis not present

## 2021-01-29 DIAGNOSIS — D2272 Melanocytic nevi of left lower limb, including hip: Secondary | ICD-10-CM | POA: Diagnosis not present

## 2021-02-09 ENCOUNTER — Encounter: Payer: Self-pay | Admitting: Family

## 2021-03-09 DIAGNOSIS — H53001 Unspecified amblyopia, right eye: Secondary | ICD-10-CM | POA: Diagnosis not present

## 2021-03-09 DIAGNOSIS — H52203 Unspecified astigmatism, bilateral: Secondary | ICD-10-CM | POA: Diagnosis not present

## 2021-03-09 DIAGNOSIS — H40023 Open angle with borderline findings, high risk, bilateral: Secondary | ICD-10-CM | POA: Diagnosis not present

## 2021-03-09 DIAGNOSIS — H5203 Hypermetropia, bilateral: Secondary | ICD-10-CM | POA: Diagnosis not present

## 2021-03-22 ENCOUNTER — Encounter: Payer: Self-pay | Admitting: Family

## 2021-03-25 ENCOUNTER — Other Ambulatory Visit: Payer: Self-pay | Admitting: Family Medicine

## 2021-03-25 DIAGNOSIS — Z1231 Encounter for screening mammogram for malignant neoplasm of breast: Secondary | ICD-10-CM

## 2021-04-08 DIAGNOSIS — Z23 Encounter for immunization: Secondary | ICD-10-CM | POA: Diagnosis not present

## 2021-04-08 DIAGNOSIS — I129 Hypertensive chronic kidney disease with stage 1 through stage 4 chronic kidney disease, or unspecified chronic kidney disease: Secondary | ICD-10-CM | POA: Diagnosis not present

## 2021-04-08 DIAGNOSIS — M549 Dorsalgia, unspecified: Secondary | ICD-10-CM | POA: Diagnosis not present

## 2021-04-08 DIAGNOSIS — N183 Chronic kidney disease, stage 3 unspecified: Secondary | ICD-10-CM | POA: Diagnosis not present

## 2021-05-11 ENCOUNTER — Inpatient Hospital Stay: Payer: PPO

## 2021-05-13 ENCOUNTER — Ambulatory Visit
Admission: RE | Admit: 2021-05-13 | Discharge: 2021-05-13 | Disposition: A | Discharge status: Home / Self Care | Payer: PPO | Source: Ambulatory Visit | Attending: Family Medicine | Admitting: Family Medicine

## 2021-05-13 ENCOUNTER — Other Ambulatory Visit: Payer: Self-pay

## 2021-05-13 ENCOUNTER — Encounter: Payer: Self-pay | Admitting: Family

## 2021-05-13 DIAGNOSIS — Z1231 Encounter for screening mammogram for malignant neoplasm of breast: Secondary | ICD-10-CM | POA: Diagnosis not present

## 2021-05-19 ENCOUNTER — Inpatient Hospital Stay: Payer: PPO | Attending: Hematology & Oncology

## 2021-05-19 ENCOUNTER — Other Ambulatory Visit: Payer: Self-pay

## 2021-05-19 DIAGNOSIS — Z79899 Other long term (current) drug therapy: Secondary | ICD-10-CM | POA: Diagnosis not present

## 2021-05-19 DIAGNOSIS — R630 Anorexia: Secondary | ICD-10-CM | POA: Insufficient documentation

## 2021-05-19 DIAGNOSIS — R197 Diarrhea, unspecified: Secondary | ICD-10-CM | POA: Insufficient documentation

## 2021-05-19 LAB — CMP (CANCER CENTER ONLY)
ALT: 37 U/L (ref 0–44)
AST: 30 U/L (ref 15–41)
Albumin: 4.5 g/dL (ref 3.5–5.0)
Alkaline Phosphatase: 79 U/L (ref 38–126)
Anion gap: 9 (ref 5–15)
BUN: 21 mg/dL (ref 8–23)
CO2: 27 mmol/L (ref 22–32)
Calcium: 10 mg/dL (ref 8.9–10.3)
Chloride: 106 mmol/L (ref 98–111)
Creatinine: 1.08 mg/dL — ABNORMAL HIGH (ref 0.44–1.00)
GFR, Estimated: 56 mL/min — ABNORMAL LOW (ref >60)
Glucose, Bld: 95 mg/dL (ref 70–99)
Potassium: 4.3 mmol/L (ref 3.5–5.1)
Sodium: 142 mmol/L (ref 135–145)
Total Bilirubin: 0.5 mg/dL (ref 0.3–1.2)
Total Protein: 7 g/dL (ref 6.5–8.1)

## 2021-05-19 LAB — CBC WITH DIFFERENTIAL (CANCER CENTER ONLY)
Abs Immature Granulocytes: 0.01 10*3/uL (ref 0.00–0.07)
Basophils Absolute: 0.1 10*3/uL (ref 0.0–0.1)
Basophils Relative: 1 %
Eosinophils Absolute: 0.3 10*3/uL (ref 0.0–0.5)
Eosinophils Relative: 5 %
HCT: 48.2 % — ABNORMAL HIGH (ref 36.0–46.0)
Hemoglobin: 16.7 g/dL — ABNORMAL HIGH (ref 12.0–15.0)
Immature Granulocytes: 0 %
Lymphocytes Relative: 35 %
Lymphs Abs: 2.1 10*3/uL (ref 0.7–4.0)
MCH: 32.3 pg (ref 26.0–34.0)
MCHC: 34.6 g/dL (ref 30.0–36.0)
MCV: 93.2 fL (ref 80.0–100.0)
Monocytes Absolute: 0.6 10*3/uL (ref 0.1–1.0)
Monocytes Relative: 10 %
Neutro Abs: 2.9 10*3/uL (ref 1.7–7.7)
Neutrophils Relative %: 49 %
Platelet Count: 200 10*3/uL (ref 150–400)
RBC: 5.17 MIL/uL — ABNORMAL HIGH (ref 3.87–5.11)
RDW: 12.3 % (ref 11.5–15.5)
WBC Count: 5.9 10*3/uL (ref 4.0–10.5)
nRBC: 0 % (ref 0.0–0.2)

## 2021-05-20 LAB — IRON AND TIBC
Iron: 81 ug/dL (ref 41–142)
Saturation Ratios: 23 % (ref 21–57)
TIBC: 346 ug/dL (ref 236–444)
UIBC: 265 ug/dL (ref 120–384)

## 2021-05-20 LAB — FERRITIN: Ferritin: 61 ng/mL (ref 11–307)

## 2021-06-30 DIAGNOSIS — R509 Fever, unspecified: Secondary | ICD-10-CM | POA: Diagnosis not present

## 2021-06-30 DIAGNOSIS — N183 Chronic kidney disease, stage 3 unspecified: Secondary | ICD-10-CM | POA: Diagnosis not present

## 2021-06-30 DIAGNOSIS — I129 Hypertensive chronic kidney disease with stage 1 through stage 4 chronic kidney disease, or unspecified chronic kidney disease: Secondary | ICD-10-CM | POA: Diagnosis not present

## 2021-06-30 DIAGNOSIS — R519 Headache, unspecified: Secondary | ICD-10-CM | POA: Diagnosis not present

## 2021-06-30 DIAGNOSIS — B349 Viral infection, unspecified: Secondary | ICD-10-CM | POA: Diagnosis not present

## 2021-07-03 DIAGNOSIS — J209 Acute bronchitis, unspecified: Secondary | ICD-10-CM | POA: Diagnosis not present

## 2021-07-03 DIAGNOSIS — H6693 Otitis media, unspecified, bilateral: Secondary | ICD-10-CM | POA: Diagnosis not present

## 2021-07-03 DIAGNOSIS — J0100 Acute maxillary sinusitis, unspecified: Secondary | ICD-10-CM | POA: Diagnosis not present

## 2021-07-18 DIAGNOSIS — H6983 Other specified disorders of Eustachian tube, bilateral: Secondary | ICD-10-CM | POA: Diagnosis not present

## 2021-07-30 ENCOUNTER — Encounter: Payer: Self-pay | Admitting: Family

## 2021-07-30 ENCOUNTER — Inpatient Hospital Stay (HOSPITAL_BASED_OUTPATIENT_CLINIC_OR_DEPARTMENT_OTHER): Payer: PPO | Admitting: Family

## 2021-07-30 ENCOUNTER — Inpatient Hospital Stay: Payer: PPO | Attending: Hematology & Oncology

## 2021-07-30 ENCOUNTER — Telehealth: Payer: Self-pay | Admitting: *Deleted

## 2021-07-30 ENCOUNTER — Other Ambulatory Visit: Payer: Self-pay

## 2021-07-30 DIAGNOSIS — Z880 Allergy status to penicillin: Secondary | ICD-10-CM | POA: Insufficient documentation

## 2021-07-30 DIAGNOSIS — R059 Cough, unspecified: Secondary | ICD-10-CM | POA: Insufficient documentation

## 2021-07-30 DIAGNOSIS — R0602 Shortness of breath: Secondary | ICD-10-CM | POA: Insufficient documentation

## 2021-07-30 DIAGNOSIS — Z885 Allergy status to narcotic agent status: Secondary | ICD-10-CM | POA: Diagnosis not present

## 2021-07-30 DIAGNOSIS — Z7982 Long term (current) use of aspirin: Secondary | ICD-10-CM | POA: Diagnosis not present

## 2021-07-30 DIAGNOSIS — Z888 Allergy status to other drugs, medicaments and biological substances status: Secondary | ICD-10-CM | POA: Insufficient documentation

## 2021-07-30 DIAGNOSIS — Z79899 Other long term (current) drug therapy: Secondary | ICD-10-CM | POA: Insufficient documentation

## 2021-07-30 LAB — CBC WITH DIFFERENTIAL (CANCER CENTER ONLY)
Abs Immature Granulocytes: 0.07 10*3/uL (ref 0.00–0.07)
Basophils Absolute: 0.1 10*3/uL (ref 0.0–0.1)
Basophils Relative: 1 %
Eosinophils Absolute: 0.4 10*3/uL (ref 0.0–0.5)
Eosinophils Relative: 6 %
HCT: 45.5 % (ref 36.0–46.0)
Hemoglobin: 15.2 g/dL — ABNORMAL HIGH (ref 12.0–15.0)
Immature Granulocytes: 1 %
Lymphocytes Relative: 30 %
Lymphs Abs: 2.1 10*3/uL (ref 0.7–4.0)
MCH: 31.3 pg (ref 26.0–34.0)
MCHC: 33.4 g/dL (ref 30.0–36.0)
MCV: 93.6 fL (ref 80.0–100.0)
Monocytes Absolute: 0.9 10*3/uL (ref 0.1–1.0)
Monocytes Relative: 12 %
Neutro Abs: 3.7 10*3/uL (ref 1.7–7.7)
Neutrophils Relative %: 50 %
Platelet Count: 198 10*3/uL (ref 150–400)
RBC: 4.86 MIL/uL (ref 3.87–5.11)
RDW: 13.4 % (ref 11.5–15.5)
WBC Count: 7.2 10*3/uL (ref 4.0–10.5)
nRBC: 0 % (ref 0.0–0.2)

## 2021-07-30 LAB — CMP (CANCER CENTER ONLY)
ALT: 30 U/L (ref 0–44)
AST: 25 U/L (ref 15–41)
Albumin: 4.1 g/dL (ref 3.5–5.0)
Alkaline Phosphatase: 103 U/L (ref 38–126)
Anion gap: 9 (ref 5–15)
BUN: 15 mg/dL (ref 8–23)
CO2: 26 mmol/L (ref 22–32)
Calcium: 9.2 mg/dL (ref 8.9–10.3)
Chloride: 106 mmol/L (ref 98–111)
Creatinine: 0.92 mg/dL (ref 0.44–1.00)
GFR, Estimated: >60 mL/min (ref >60)
Glucose, Bld: 110 mg/dL — ABNORMAL HIGH (ref 70–99)
Potassium: 3.5 mmol/L (ref 3.5–5.1)
Sodium: 141 mmol/L (ref 135–145)
Total Bilirubin: 0.4 mg/dL (ref 0.3–1.2)
Total Protein: 7 g/dL (ref 6.5–8.1)

## 2021-07-30 LAB — IRON AND IRON BINDING CAPACITY (CC-WL,HP ONLY)
Iron: 150 ug/dL (ref 28–170)
Saturation Ratios: 44 % — ABNORMAL HIGH (ref 10.4–31.8)
TIBC: 340 ug/dL (ref 250–450)
UIBC: 190 ug/dL (ref 148–442)

## 2021-07-30 LAB — FERRITIN: Ferritin: 159 ng/mL (ref 11–307)

## 2021-07-30 NOTE — Telephone Encounter (Signed)
Per 07/30/21 los gave upcoming appointments - confirmed °

## 2021-07-30 NOTE — Progress Notes (Signed)
Hematology and Oncology Follow Up Visit  DAIELLE MELCHER [id number] 1950-09-27 71 y.o. 07/30/2021   Principle Diagnosis:  Hemochromatosis - Heterozygous H63D mutation   Current Therapy:        Aspirin 81 mg PO daily Phlebotomy to keep ferritin < 50 and iron sat < 30%    Interim History:  Ms. Knittel is here today for follow-up. She is getting over a bout with congestion and drainage. She states that her husband had Covid at the end of December and despite her having the same symptoms she tested negative.  She has an occasional cough with clear phlegm. SOB with exertion is similar to baseline.  No fever, chills, n/v, rash, dizziness, chest pain, palpitations, abdominal pain or changes in bowel or bladder habits.  No swelling, tenderness, numbness or tingling in her extremities.  No falls or syncope.  She has maintained a good appetite and is doing her best to stay well hydrated.   ECOG Performance Status: 1 - Symptomatic but completely ambulatory  Medications:  Allergies as of 07/30/2021       Reactions   Prednisone Palpitations   Latex Other (See Comments)   Skin blisters   Codeine Rash   Penicillins Rash   Tape Rash   Adhesive tape        Medication List        Accurate as of July 30, 2021 11:23 AM. If you have any questions, ask your nurse or doctor.          amLODipine 5 MG tablet Commonly known as: NORVASC Take 5 mg by mouth daily.   aspirin 81 MG tablet Take 81 mg by mouth daily.   CALCIUM PO Take 1 capsule by mouth daily. 1200mg  daily   CENTRUM SILVER PO Take 1 tablet by mouth daily.   Cetirizine HCl 10 MG Caps Commonly known as: ZyrTEC Allergy One at bedtime   clotrimazole-betamethasone cream Commonly known as: LOTRISONE Apply 1 application topically 2 (two) times daily.   cyclobenzaprine 10 MG tablet Commonly known as: FLEXERIL Take 10 mg by mouth at bedtime as needed for muscle spasms.   fluticasone 50 MCG/ACT nasal  spray Commonly known as: FLONASE daily.   GLUCOSAMINE CHONDR COMPLEX PO Take 1 capsule by mouth daily.   losartan 25 MG tablet Commonly known as: COZAAR Take 12.5 mg by mouth daily.   magnesium gluconate 500 MG tablet Commonly known as: MAGONATE Take 500 mg by mouth 2 (two) times daily.   metoprolol succinate 50 MG 24 hr tablet Commonly known as: TOPROL-XL Take 50 mg by mouth daily.        Allergies:  Allergies  Allergen Reactions   Prednisone Palpitations   Latex Other (See Comments)    Skin blisters   Codeine Rash   Penicillins Rash   Tape Rash    Adhesive tape    Past Medical History, Surgical history, Social history, and Family History were reviewed and updated.  Review of Systems: All other 10 point review of systems is negative.   Physical Exam:  vitals were not taken for this visit.   Wt Readings from Last 3 Encounters:  01/27/21 152 lb (68.9 kg)  07/10/20 143 lb 12.8 oz (65.2 kg)  03/31/20 146 lb (66.2 kg)    Ocular: Sclerae unicteric, pupils equal, round and reactive to light Ear-nose-throat: Oropharynx clear, dentition fair Lymphatic: No cervical or supraclavicular adenopathy Lungs no rales or rhonchi, good excursion bilaterally Heart regular rate and rhythm, no murmur appreciated Abd soft,  nontender, positive bowel sounds MSK no focal spinal tenderness, no joint edema Neuro: non-focal, well-oriented, appropriate affect Breasts: Deferred   Lab Results  Component Value Date   WBC 7.2 07/30/2021   HGB 15.2 (H) 07/30/2021   HCT 45.5 07/30/2021   MCV 93.6 07/30/2021   PLT 198 07/30/2021   Lab Results  Component Value Date   FERRITIN 61 05/19/2021   IRON 81 05/19/2021   TIBC 346 05/19/2021   UIBC 265 05/19/2021   IRONPCTSAT 23 05/19/2021   Lab Results  Component Value Date   RBC 4.86 07/30/2021   No results found for: KPAFRELGTCHN, LAMBDASER, KAPLAMBRATIO No results found for: IGGSERUM, IGA, IGMSERUM No results found for:  Odetta Pink, SPEI   Chemistry      Component Value Date/Time   NA 142 05/19/2021 1321   NA 148 (H) 06/29/2017 0813   NA 143 03/30/2017 0805   K 4.3 05/19/2021 1321   K 3.8 06/29/2017 0813   K 4.0 03/30/2017 0805   CL 106 05/19/2021 1321   CL 105 06/29/2017 0813   CO2 27 05/19/2021 1321   CO2 28 06/29/2017 0813   CO2 26 03/30/2017 0805   BUN 21 05/19/2021 1321   BUN 21 06/29/2017 0813   BUN 18.2 03/30/2017 0805   CREATININE 1.08 (H) 05/19/2021 1321   CREATININE 1.3 (H) 06/29/2017 0813   CREATININE 1.0 03/30/2017 0805      Component Value Date/Time   CALCIUM 10.0 05/19/2021 1321   CALCIUM 9.5 06/29/2017 0813   CALCIUM 9.6 03/30/2017 0805   ALKPHOS 79 05/19/2021 1321   ALKPHOS 78 06/29/2017 0813   ALKPHOS 73 03/30/2017 0805   AST 30 05/19/2021 1321   AST 29 03/30/2017 0805   ALT 37 05/19/2021 1321   ALT 32 06/29/2017 0813   ALT 26 03/30/2017 0805   BILITOT 0.5 05/19/2021 1321   BILITOT 0.67 03/30/2017 0805       Impression and Plan: Ms. Abdelrahman is a very pleasant 71 yo caucasian female with hemochromatosis, heterozygous for the H63D mutation.  Iron studies are pending. We will schedule her for phlebotomy if needed.  Lab check in 3 months. Follow-up in 6 months.   Lottie Dawson, NP 1/27/202311:23 AM

## 2021-08-02 DIAGNOSIS — Z8049 Family history of malignant neoplasm of other genital organs: Secondary | ICD-10-CM | POA: Diagnosis not present

## 2021-08-02 DIAGNOSIS — N941 Unspecified dyspareunia: Secondary | ICD-10-CM | POA: Diagnosis not present

## 2021-08-02 DIAGNOSIS — Z124 Encounter for screening for malignant neoplasm of cervix: Secondary | ICD-10-CM | POA: Diagnosis not present

## 2021-08-02 DIAGNOSIS — Z01419 Encounter for gynecological examination (general) (routine) without abnormal findings: Secondary | ICD-10-CM | POA: Diagnosis not present

## 2021-08-02 DIAGNOSIS — N952 Postmenopausal atrophic vaginitis: Secondary | ICD-10-CM | POA: Diagnosis not present

## 2021-08-05 ENCOUNTER — Other Ambulatory Visit: Payer: Self-pay

## 2021-08-05 ENCOUNTER — Inpatient Hospital Stay: Payer: PPO | Attending: Family

## 2021-08-05 MED ORDER — SODIUM CHLORIDE 0.9 % IV SOLN: Freq: Once | INTRAVENOUS | Status: AC

## 2021-08-05 NOTE — Patient Instructions (Signed)

## 2021-08-05 NOTE — Progress Notes (Signed)
Shelby Harrington presents today for phlebotomy per MD orders. Phlebotomy procedure started at 1208 and ended at 1228. 548 cc removed via 20 G needle at R [facility] site.NS 500 ml given after procedure via the same IV. Patient tolerated procedure well. IV needle removed intact.

## 2021-09-06 DIAGNOSIS — R0789 Other chest pain: Secondary | ICD-10-CM | POA: Diagnosis not present

## 2021-09-06 DIAGNOSIS — H53001 Unspecified amblyopia, right eye: Secondary | ICD-10-CM | POA: Diagnosis not present

## 2021-09-06 DIAGNOSIS — H40023 Open angle with borderline findings, high risk, bilateral: Secondary | ICD-10-CM | POA: Diagnosis not present

## 2021-09-06 DIAGNOSIS — H2513 Age-related nuclear cataract, bilateral: Secondary | ICD-10-CM | POA: Diagnosis not present

## 2021-09-06 DIAGNOSIS — H35372 Puckering of macula, left eye: Secondary | ICD-10-CM | POA: Diagnosis not present

## 2021-09-12 NOTE — Progress Notes (Deleted)
?Cardiology Office Note:   ?Date:  09/12/2021  ?NAME:  Shelby Harrington    ?MRN: [id number] ?DOB:  October 24, 1950  ? ?PCP:  [name], MD  ?Cardiologist:  None  ?Electrophysiologist:  None  ? ?Referring MD: Shirline Frees, MD  ? ?No chief complaint on file. ?*** ? ?History of Present Illness:   ?Shelby Harrington is a 71 y.o. female with a hx of hemochromatosis who is being seen today for the evaluation of chest pain at the request of Shirline Frees, MD. ? ?Problem List ?Hemochromatosis ?HTN ? ?Past Medical History: ?Past Medical History:  ?Diagnosis Date  ? Family history of malignant neoplasm of breast   ? Family history of uterine cancer   ? 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 Hypertension   ? ? ?Past Surgical History: ?Past Surgical History:  ?Procedure Laterality Date  ? BREAST EXCISIONAL BIOPSY Right 1983  ? Benign  ? ? ?Current Medications: ?No outpatient medications have been marked as taking for the 09/14/21 encounter (Appointment) with O'Neal, Cassie Freer, MD.  ?  ? ?Allergies:    ?Prednisone, Latex, Codeine, Penicillins, and Tape  ? ?Social History: ?Social History  ? ?Socioeconomic History  ? Marital status: Married  ?  Spouse name: Not on file  ? Number of children: Not on file  ? Years of education: Not on file  ? Highest education level: Not on file  ?Occupational History  ? Not on file  ?Tobacco Use  ? Smoking status: Never  ? Smokeless tobacco: Never  ?Vaping Use  ? Vaping Use: Never used  ?Substance and Sexual Activity  ? Alcohol use: Yes  ?  Alcohol/week: 0.0 standard drinks  ?  Comment: glass of wine on the weekends  ? Drug use: No  ? Sexual activity: Not on file  ?Other Topics Concern  ? Not on file  ?Social History Narrative  ? Not on file  ? ?Social Determinants of Health  ? ?Financial Resource Strain: Not on file  ?Food Insecurity: Not on file  ?Transportation Needs: Not on file  ?Physical Activity: Not on file  ?Stress: Not on file  ?Social Connections:  Not on file  ?  ? ?Family History: ?The patient's ***family history includes Breast cancer (age of onset: 40) in her mother; Diabetes in her father; Emphysema in her mother; Hypertension in her father and mother; Skin cancer in her father; Uterine cancer (age of onset: 28) in her sister. ? ?ROS:   ?All other ROS reviewed and negative. Pertinent positives noted in the HPI.    ? ?EKGs/Labs/Other Studies Reviewed:   ?The following studies were personally reviewed by me today: ? ?EKG:  EKG is *** ordered today.  The ekg ordered today demonstrates ***, and was personally reviewed by me.  ? ?Recent Labs: ?07/30/2021: ALT 30; BUN 15; Creatinine 0.92; Hemoglobin 15.2; Platelet Count 198; Potassium 3.5; Sodium 141  ? ?Recent Lipid Panel ?No results found for: CHOL, TRIG, HDL, CHOLHDL, VLDL, LDLCALC, LDLDIRECT ? ?Physical Exam:   ?VS:  There were no vitals taken for this visit.   ?Wt Readings from Last 3 Encounters:  ?07/30/21 147 lb (66.7 kg)  ?01/27/21 152 lb (68.9 kg)  ?07/10/20 143 lb 12.8 oz (65.2 kg)  ?  ?General: Well nourished, well developed, in no acute distress ?Head: Atraumatic, normal size  ?Eyes: PEERLA, EOMI  ?Neck: Supple, no JVD ?Endocrine: No thryomegaly ?Cardiac: Normal S1, S2; RRR; no murmurs, rubs, or gallops ?Lungs: Clear to auscultation  bilaterally, no wheezing, rhonchi or rales  ?Abd: Soft, nontender, no hepatomegaly  ?Ext: No edema, pulses 2+ ?Musculoskeletal: No deformities, BUE and BLE strength normal and equal ?Skin: Warm and dry, no rashes   ?Neuro: Alert and oriented to person, place, time, and situation, CNII-XII grossly intact, no focal deficits  ?Psych: Normal mood and affect  ? ?ASSESSMENT:   ?Shelby Harrington is a 71 y.o. female who presents for the following: ?No diagnosis found. ? ?PLAN:   ?There are no diagnoses linked to this encounter. ? ?{Are you ordering a CV Procedure (e.g. stress test, cath, DCCV, TEE, etc)?   Press F2        :[id number]} ? ?Disposition: No follow-ups on  file. ? ?Medication Adjustments/Labs and Tests Ordered: ?Current medicines are reviewed at length with the patient today.  Concerns regarding medicines are outlined above.  ?No orders of the defined types were placed in this encounter. ? ?No orders of the defined types were placed in this encounter. ? ? ?There are no Patient Instructions on file for this visit.  ? ?Time Spent with Patient: I have spent a total of *** minutes with patient reviewing hospital notes, telemetry, EKGs, labs and examining the patient as well as establishing an assessment and plan that was discussed with the patient.  > 50% of time was spent in direct patient care. ? ?Signed, ?[place] T. Audie Box, MD, [facility] ?[address]  ?[address], [address] ?[place], [place] [address] ?[phone]  ?09/12/2021 6:56 PM    ? ?

## 2021-09-14 ENCOUNTER — Ambulatory Visit: Payer: PPO | Admitting: Cardiology

## 2021-09-14 ENCOUNTER — Encounter: Payer: Self-pay | Admitting: Cardiology

## 2021-09-14 ENCOUNTER — Ambulatory Visit (INDEPENDENT_AMBULATORY_CARE_PROVIDER_SITE_OTHER): Payer: PPO

## 2021-09-14 ENCOUNTER — Ambulatory Visit: Payer: PPO | Admitting: Cardiovascular Disease

## 2021-09-14 ENCOUNTER — Other Ambulatory Visit: Payer: Self-pay

## 2021-09-14 VITALS — BP 140/88 | HR 60 | Ht 60.5 in | Wt 152.4 lb

## 2021-09-14 DIAGNOSIS — R002 Palpitations: Secondary | ICD-10-CM | POA: Diagnosis not present

## 2021-09-14 DIAGNOSIS — R072 Precordial pain: Secondary | ICD-10-CM

## 2021-09-14 DIAGNOSIS — Z8249 Family history of ischemic heart disease and other diseases of the circulatory system: Secondary | ICD-10-CM

## 2021-09-14 DIAGNOSIS — R079 Chest pain, unspecified: Secondary | ICD-10-CM | POA: Diagnosis not present

## 2021-09-14 DIAGNOSIS — I10 Essential (primary) hypertension: Secondary | ICD-10-CM | POA: Diagnosis not present

## 2021-09-14 DIAGNOSIS — Z79899 Other long term (current) drug therapy: Secondary | ICD-10-CM | POA: Diagnosis not present

## 2021-09-14 LAB — BASIC METABOLIC PANEL
BUN/Creatinine Ratio: 19 (ref 12–28)
BUN: 16 mg/dL (ref 8–27)
CO2: 24 mmol/L (ref 20–29)
Calcium: 9.2 mg/dL (ref 8.7–10.3)
Chloride: 106 mmol/L (ref 96–106)
Creatinine, Ser: 0.83 mg/dL (ref 0.57–1.00)
Glucose: 92 mg/dL (ref 70–99)
Potassium: 3.7 mmol/L (ref 3.5–5.2)
Sodium: 142 mmol/L (ref 134–144)
eGFR: 77 mL/min/{1.73_m2} (ref >59)

## 2021-09-14 LAB — MAGNESIUM: Magnesium: 2.3 mg/dL (ref 1.6–2.3)

## 2021-09-14 MED ORDER — METOPROLOL TARTRATE 50 MG PO TABS: ORAL_TABLET | ORAL | 0 refills | Status: DC

## 2021-09-14 NOTE — Patient Instructions (Addendum)
Medication Instructions:  ?Your physician recommends that you continue on your current medications as directed. Please refer to the Current Medication list given to you today.  ?*If you need a refill on your cardiac medications before your next appointment, please call your pharmacy* ? ? ?Lab Work: ?Your physician recommends that you return for lab work in:  ?TODAY: BMET, [place] ?If you have labs (blood work) drawn today and your tests are completely normal, you will receive your results only by: ?MyChart Message (if you have MyChart) OR ?A paper copy in the mail ?If you have any lab test that is abnormal or we need to change your treatment, we will call you to review the results. ? ? ?Testing/Procedures: ?ZIO XT- Long Term Monitor Instructions ? ?Your physician has requested you wear a ZIO patch monitor for 14 days.  ?This is a single patch monitor. [employer] supplies one patch monitor per enrollment. Additional ?stickers are not available. Please do not apply patch if you will be having a Nuclear Stress Test,  ?Echocardiogram, Cardiac CT, MRI, or Chest Xray during the period you would be wearing the  ?monitor. The patch cannot be worn during these tests. You cannot remove and re-apply the  ?ZIO XT patch monitor.  ?Your ZIO patch monitor will be mailed 3 day USPS to your address on file. It may take 3-5 days  ?to receive your monitor after you have been enrolled.  ?Once you have received your monitor, please review the enclosed instructions. Your monitor  ?has already been registered assigning a specific monitor serial # to you. ? ?Billing and Patient Assistance Program Information ? ?We have supplied [employer] with any of your insurance information on file for billing purposes. ?[employer] offers a sliding scale Patient Assistance Program for patients that do not have  ?insurance, or whose insurance does not completely cover the cost of the ZIO monitor.  ?You must apply for the Patient Assistance Program to qualify for this  discounted rate.  ?To apply, please call [employer] at [phone], select option 4, select option 2, ask to apply for  ?Patient Assistance Program. Theodore Demark will ask your household income, and how many people  ?are in your household. They will quote your out-of-pocket cost based on that information.  ?[employer] will also be able to set up a 21-month interest-free payment plan if needed. ? ?Applying the monitor ?  ?Shave hair from upper left chest.  ?Hold abrader disc by orange tab. Rub abrader in 40 strokes over the upper left chest as  ?indicated in your monitor instructions.  ?Clean area with 4 enclosed alcohol pads. Let dry.  ?Apply patch as indicated in monitor instructions. Patch will be placed under collarbone on left  ?side of chest with arrow pointing upward.  ?Rub patch adhesive wings for 2 minutes. Remove white label marked "1". Remove the white  ?label marked "2". Rub patch adhesive wings for 2 additional minutes.  ?While looking in a mirror, press and release button in center of patch. A small green light will  ?flash 3-4 times. This will be your only indicator that the monitor has been turned on.  ?Do not shower for the first 24 hours. You may shower after the first 24 hours.  ?Press the button if you feel a symptom. You will hear a small click. Record Date, Time and  ?Symptom in the Patient Logbook.  ?When you are ready to remove the patch, follow instructions on the last 2 pages of Patient  ?Logbook. Stick patch  monitor onto the last page of Patient Logbook.  ?Place Patient Logbook in the blue and white box. Use locking tab on box and tape box closed  ?securely. The blue and white box has prepaid postage on it. Please place it in the mailbox as  ?soon as possible. Your physician should have your test results approximately 7 days after the  ?monitor has been mailed back to [facility].  ?Call [facility] at [phone] if you have questions regarding  ?your ZIO XT patch monitor. Call  them immediately if you see an orange light blinking on your  ?monitor.  ?If your monitor falls off in less than 4 days, contact our Monitor department at [phone].  ?If your monitor becomes loose or falls off after 4 days call [employer] at [phone] for  ?suggestions on securing your monitor ? ? ? ?Your cardiac CT will be scheduled at one of the below locations:  ? ?[facility] ?[address] ?[place], [place] [address] ?[phone] ? ? ?If scheduled at [facility], please arrive at the [facility] and Children's Entrance (Entrance C2) of [facility] 30 minutes prior to test start time. ?You can use the FREE valet parking offered at entrance C (encouraged to control the heart rate for the test)  ?Proceed to the [facility] Radiology Department (first floor) to check-in and test prep. ? ?All radiology patients and guests should use entrance C2 at [facility], accessed from [facility], even though the hospital's physical address listed is [address]. ? ? ? ? ? ?Please follow these instructions carefully (unless otherwise directed): ? ? ?On the Night Before the Test: ?Be sure to Drink plenty of water. ?Do not consume any caffeinated/decaffeinated beverages or chocolate 12 hours prior to your test. ?Do not take any antihistamines 12 hours prior to your test. ? ? ?On the Day of the Test: ?Drink plenty of water until 1 hour prior to the test. ?Do not eat any food 4 hours prior to the test. ?You may take your regular medications prior to the test.  ?Take metoprolol tartrate (Lopressor) two hours prior to test. ?HOLD Metoprolol succinate (Toprol-XL), Losartan and Amlodipine  the morning of the test. ?FEMALES- please wear underwire-free bra if available, avoid dresses & tight clothing ?     ?After the Test: ?Drink plenty of water. ?After receiving IV contrast, you may experience a mild flushed feeling. This is normal. ?On occasion, you may experience a mild  rash up to 24 hours after the test. This is not dangerous. If this occurs, you can take Benadryl 25 mg and increase your fluid intake. ?If you experience trouble breathing, this can be serious. If it is severe call 911 IMMEDIATELY. If it is mild, please call our office. ?If you take any of these medications: Glipizide/Metformin, Avandament, Glucavance, please do not take 48 hours after completing test unless otherwise instructed. ? ?We will call to schedule your test 2-4 weeks out understanding that some insurance companies will need an authorization prior to the service being performed.  ? ?For non-scheduling related questions, please contact the cardiac imaging nurse navigator should you have any questions/concerns: ?Marchia Bond, Cardiac Imaging Nurse Navigator ?Gordy Clement, Cardiac Imaging Nurse Navigator ?[facility] ?Direct Office Dial: [phone]  ? ?For scheduling needs, including cancellations and rescheduling, please call [place], [phone]. ? ? ?Follow-Up: ?At [facility], you and your health needs are our priority.  As part of our  continuing mission to provide you with exceptional heart care, we have created designated Provider Care Teams.  These Care Teams include your primary Cardiologist (physician) and Advanced Practice Providers (APPs -  Physician Assistants and Nurse Practitioners) who all work together to provide you with the care you need, when you need it. ? ?We recommend signing up for the patient portal called "MyChart".  Sign up information is provided on this After Visit Summary.  MyChart is used to connect with patients for Virtual Visits (Telemedicine).  Patients are able to view lab/test results, encounter notes, upcoming appointments, etc.  Non-urgent messages can be sent to your provider as well.   ?To learn more about what you can do with MyChart, go to [email or web address].   ? ?Your next appointment:   ?6 month(s) ? ?The format for your next  appointment:   ?In Person ? ?Provider:   ?Berniece Salines, DO   ? ? ?Other Instructions ?  ?

## 2021-09-14 NOTE — Progress Notes (Unsigned)
Enrolled for [employer] to mail a ZIO XT long term holter monitor to the patients address on file.  

## 2021-09-14 NOTE — Progress Notes (Signed)
?Cardiology Office Note:   ? ?Date:  09/14/2021  ? ?ID:  BRIANE BIRDEN, DOB March 24, 1951, MRN [id number] ? ?PCP:  [name], MD  ?Cardiologist:  Berniece Salines, DO  ?Electrophysiologist:  None  ? ?Referring MD: Shirline Frees, MD  ? ?" I am having chest pain" ? ?History of Present Illness:   ? ?Shelby Harrington is a 71 y.o. female with a hx of hypertension, family history of premature coronary artery disease in her dad, obesity is here today to be evaluated for chest discomfort.   ? ?The patient tells me that over the last several months she has had intermittent left-sided chest discomfort.  She describes it as a stabbing sensation.  She notes that off and on.  Nothing makes it better or worse.  She is concerned.  Recently is bit more like a heaviness.  She tells me that she has had some radiation to her shoulder and then down her arm. ? ?She also tells me that she has been experiencing intermittent palpitations. ? ? ?Past Medical History:  ?Diagnosis Date  ? Family history of malignant neoplasm of breast   ? Family history of uterine cancer   ? 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 Hypertension   ? ? ?Past Surgical History:  ?Procedure Laterality Date  ? BREAST EXCISIONAL BIOPSY Right 1983  ? Benign  ? ? ?Current Medications: ?Current Meds  ?Medication Sig  ? ALPRAZolam (XANAX) 0.25 MG tablet Take 0.25 mg by mouth 2 (two) times daily as needed for anxiety.  ? amLODipine (NORVASC) 5 MG tablet Take 5 mg by mouth daily.  ? aspirin 81 MG tablet Take 81 mg by mouth daily.  ? CALCIUM PO Take 1 capsule by mouth daily. '1200mg'$  daily  ? clotrimazole-betamethasone (LOTRISONE) cream Apply 1 application topically 2 (two) times daily.  ? cyclobenzaprine (FLEXERIL) 10 MG tablet Take 10 mg by mouth at bedtime as needed for muscle spasms.   ? fluticasone (FLONASE) 50 MCG/ACT nasal spray daily.  ? Glucosamine-Chondroitin (GLUCOSAMINE CHONDR COMPLEX PO) Take 1 capsule by mouth daily.  ? losartan  (COZAAR) 25 MG tablet Take 12.5 mg by mouth daily.  ? magnesium gluconate (MAGONATE) 500 MG tablet Take 500 mg by mouth 2 (two) times daily.  ? metoprolol succinate (TOPROL-XL) 25 MG 24 hr tablet Take 25 mg by mouth daily.  ? metoprolol tartrate (LOPRESSOR) 50 MG tablet Take 2 hours before CT scan  ? Multiple Vitamins-Minerals (CENTRUM SILVER PO) Take 1 tablet by mouth daily.  ?  ? ?Allergies:   Prednisone, Latex, Codeine, Penicillins, and Tape  ? ?Social History  ? ?Socioeconomic History  ? Marital status: Married  ?  Spouse name: Not on file  ? Number of children: Not on file  ? Years of education: Not on file  ? Highest education level: Not on file  ?Occupational History  ? Not on file  ?Tobacco Use  ? Smoking status: Never  ? Smokeless tobacco: Never  ?Vaping Use  ? Vaping Use: Never used  ?Substance and Sexual Activity  ? Alcohol use: Yes  ?  Alcohol/week: 0.0 standard drinks  ?  Comment: glass of wine on the weekends  ? Drug use: No  ? Sexual activity: Not on file  ?Other Topics Concern  ? Not on file  ?Social History Narrative  ? Not on file  ? ?Social Determinants of Health  ? ?Financial Resource Strain: Not on file  ?Food Insecurity: Not on file  ?Transportation  Needs: Not on file  ?Physical Activity: Not on file  ?Stress: Not on file  ?Social Connections: Not on file  ?  ? ?Family History: ?The patient's family history includes Breast cancer (age of onset: 43) in her mother; Diabetes in her father; Emphysema in her mother; Hypertension in her father and mother; Skin cancer in her father; Uterine cancer (age of onset: 25) in her sister. ? ?ROS:   ?Review of Systems  ?Constitution: Negative for decreased appetite, fever and weight gain.  ?HENT: Negative for congestion, ear discharge, hoarse voice and sore throat.   ?Eyes: Negative for discharge, redness, vision loss in right eye and visual halos.  ?Cardiovascular: Negative for chest pain, dyspnea on exertion, leg swelling, orthopnea and palpitations.   ?Respiratory: Negative for cough, hemoptysis, shortness of breath and snoring.   ?Endocrine: Negative for heat intolerance and polyphagia.  ?Hematologic/Lymphatic: Negative for bleeding problem. Does not bruise/bleed easily.  ?Skin: Negative for flushing, nail changes, rash and suspicious lesions.  ?Musculoskeletal: Negative for arthritis, joint pain, muscle cramps, myalgias, neck pain and stiffness.  ?Gastrointestinal: Negative for abdominal pain, bowel incontinence, diarrhea and excessive appetite.  ?Genitourinary: Negative for decreased libido, genital sores and incomplete emptying.  ?Neurological: Negative for brief paralysis, focal weakness, headaches and loss of balance.  ?Psychiatric/Behavioral: Negative for altered mental status, depression and suicidal ideas.  ?Allergic/Immunologic: Negative for HIV exposure and persistent infections.  ? ? ?EKGs/Labs/Other Studies Reviewed:   ? ?The following studies were reviewed today: ? ? ?EKG:  The ekg ordered today demonstrates sinus rhythm, heart rate 60 bpm with nonspecific ST changes. ? ?Recent Labs: ?07/30/2021: ALT 30; BUN 15; Creatinine 0.92; Hemoglobin 15.2; Platelet Count 198; Potassium 3.5; Sodium 141  ?Recent Lipid Panel ?No results found for: CHOL, TRIG, HDL, CHOLHDL, VLDL, LDLCALC, LDLDIRECT ? ?Physical Exam:   ? ?VS:  BP 140/88   Pulse 60   Ht 5' 0.5" (1.537 m)   Wt 152 lb 6.4 oz (69.1 kg)   SpO2 98%   BMI 29.27 kg/m?    ? ?Wt Readings from Last 3 Encounters:  ?09/14/21 152 lb 6.4 oz (69.1 kg)  ?07/30/21 147 lb (66.7 kg)  ?01/27/21 152 lb (68.9 kg)  ?  ? ?GEN: Well nourished, well developed in no acute distress ?HEENT: Normal ?NECK: No JVD; No carotid bruits ?LYMPHATICS: No lymphadenopathy ?CARDIAC: S1S2 noted,RRR, no murmurs, rubs, gallops ?RESPIRATORY:  Clear to auscultation without rales, wheezing or rhonchi  ?ABDOMEN: Soft, non-tender, non-distended, +bowel sounds, no guarding. ?EXTREMITIES: No edema, No cyanosis, no clubbing ?MUSCULOSKELETAL:  No  deformity  ?SKIN: Warm and dry ?NEUROLOGIC:  Alert and oriented x 3, non-focal ?PSYCHIATRIC:  Normal affect, good insight ? ?ASSESSMENT:   ? ?1. Chest pain, unspecified type   ?2. Palpitations   ?3. Family history of premature CAD   ?4. Hypertension, unspecified type   ?5. Medication management   ? ?PLAN:   ? ?The symptoms chest pain is concerning, this patient does have intermediate risk for coronary artery disease and at this time I would like to pursue an ischemic evaluation in this patient.  Shared decision a coronary CTA at this time is appropriate.  I have discussed with the patient about the testing.  The patient has no IV contrast allergy and is agreeable to proceed with this test. ? ?I would like to rule out a cardiovascular etiology of this palpitation, therefore at this time I would like to placed a zio patch for  14  days.  ? ?Blood pressure slightly  elevated but this appears to be an isolated reading we will continue to monitor the patient. ? ?The patient is in agreement with the above plan. The patient left the office in stable condition.  The patient will follow up in 6 months ? ? ?Medication Adjustments/Labs and Tests Ordered: ?Current medicines are reviewed at length with the patient today.  Concerns regarding medicines are outlined above.  ?Orders Placed This Encounter  ?Procedures  ? CT CORONARY MORPH W/CTA COR W/SCORE W/CA W/CM &/OR WO/CM  ? Basic Metabolic Panel (BMET)  ? Magnesium  ? LONG TERM MONITOR (3-14 DAYS)  ? EKG 12-Lead  ? ?Meds ordered this encounter  ?Medications  ? metoprolol tartrate (LOPRESSOR) 50 MG tablet  ?  Sig: Take 2 hours before CT scan  ?  Dispense:  1 tablet  ?  Refill:  0  ? ? ?Patient Instructions  ?Medication Instructions:  ?Your physician recommends that you continue on your current medications as directed. Please refer to the Current Medication list given to you today.  ?*If you need a refill on your cardiac medications before your next appointment, please call your  pharmacy* ? ? ?Lab Work: ?Your physician recommends that you return for lab work in:  ?TODAY: BMET, [place] ?If you have labs (blood work) drawn today and your tests are completely normal, you will receive your resul

## 2021-09-16 DIAGNOSIS — N952 Postmenopausal atrophic vaginitis: Secondary | ICD-10-CM | POA: Diagnosis not present

## 2021-09-17 DIAGNOSIS — R002 Palpitations: Secondary | ICD-10-CM | POA: Diagnosis not present

## 2021-09-28 ENCOUNTER — Telehealth (HOSPITAL_COMMUNITY): Payer: Self-pay | Admitting: *Deleted

## 2021-09-28 NOTE — Telephone Encounter (Signed)
Reaching out to patient to offer assistance regarding upcoming cardiac imaging study; pt verbalizes understanding of appt date/time, parking situation and where to check in, pre-test NPO status and medications ordered, and verified current allergies; name and call back number provided for further questions should they arise ? ?Gordy Clement RN Navigator Cardiac Imaging ?[facility] ?[phone] office ?[phone] cell ? ?Patient to take '50mg'$  metoprolol tartrate two hours prior to her cardiac CT scan.  She is aware to arrive at 10:30am for her 11am scan.  ?

## 2021-09-29 ENCOUNTER — Encounter (HOSPITAL_COMMUNITY): Payer: Self-pay

## 2021-09-29 ENCOUNTER — Other Ambulatory Visit: Payer: Self-pay

## 2021-09-29 ENCOUNTER — Ambulatory Visit (HOSPITAL_COMMUNITY)
Admission: RE | Admit: 2021-09-29 | Discharge: 2021-09-29 | Disposition: A | Discharge status: Home / Self Care | Payer: PPO | Source: Ambulatory Visit | Attending: Cardiology | Admitting: Cardiology

## 2021-09-29 DIAGNOSIS — I700 Atherosclerosis of aorta: Secondary | ICD-10-CM | POA: Insufficient documentation

## 2021-09-29 DIAGNOSIS — R079 Chest pain, unspecified: Secondary | ICD-10-CM | POA: Diagnosis not present

## 2021-09-29 MED ORDER — NITROGLYCERIN 0.4 MG SL SUBL
SUBLINGUAL_TABLET | SUBLINGUAL | Status: AC
  Filled 2021-09-29: qty 2

## 2021-09-29 MED ORDER — IOHEXOL 350 MG/ML SOLN
100.0000 mL | Freq: Once | INTRAVENOUS | Status: AC | PRN
  Administered 2021-09-29: 100 mL via INTRAVENOUS

## 2021-09-29 MED ORDER — NITROGLYCERIN 0.4 MG SL SUBL
0.8000 mg | SUBLINGUAL_TABLET | Freq: Once | SUBLINGUAL | Status: AC
  Administered 2021-09-29: 0.8 mg via SUBLINGUAL

## 2021-09-29 MED ORDER — NITROGLYCERIN 0.4 MG SL SUBL: 0.8000 mg | SUBLINGUAL_TABLET | Freq: Once | SUBLINGUAL | Status: DC

## 2021-09-29 NOTE — Progress Notes (Signed)
CT scan completed. Tolerated well. D/C home ambulatory, awake and alert. In no distress. 

## 2021-10-01 ENCOUNTER — Encounter: Payer: Self-pay | Admitting: Cardiology

## 2021-10-06 DIAGNOSIS — R002 Palpitations: Secondary | ICD-10-CM | POA: Diagnosis not present

## 2021-10-13 ENCOUNTER — Encounter: Payer: Self-pay | Admitting: Cardiology

## 2021-10-13 NOTE — Telephone Encounter (Signed)
?  Berniece Salines, DO  ?10/13/2021 12:13 PM EDT   ?  ?Please have the patient see me virtually to discuss her monitor results.  ? Called patient. ? Patient  agreed for  virtual visit to discuss monitor report.  The instruction for virtual  visit given to patient. She verbalized understanding. ? Appt schedule for 10/14/21 at 11 am. ?  ? ? ?

## 2021-10-14 ENCOUNTER — Telehealth (INDEPENDENT_AMBULATORY_CARE_PROVIDER_SITE_OTHER): Payer: PPO | Admitting: Cardiology

## 2021-10-14 VITALS — BP 124/87 | HR 69 | Ht 59.0 in | Wt 155.0 lb

## 2021-10-14 DIAGNOSIS — I491 Atrial premature depolarization: Secondary | ICD-10-CM

## 2021-10-14 DIAGNOSIS — I471 Supraventricular tachycardia, unspecified: Secondary | ICD-10-CM | POA: Insufficient documentation

## 2021-10-14 NOTE — Patient Instructions (Signed)

## 2021-10-14 NOTE — Progress Notes (Signed)
? ?Virtual Visit via Video Note  ? ?This visit type was conducted due to national recommendations for restrictions regarding the COVID-19 Pandemic (e.g. social distancing) in an effort to limit this patient's exposure and mitigate transmission in our community.  Due to her co-morbid illnesses, this patient is at least at moderate risk for complications without adequate follow up.  This format is felt to be most appropriate for this patient at this time.  All issues noted in this document were discussed and addressed.  A limited physical exam was performed with this format.  Please refer to the patient's chart for her consent to telehealth for [facility]. ? ?  ?Date:  10/14/2021  ? ?ID:  Shelby Harrington, DOB [birth year], MRN [id number] ? ?Patient Location: Home ?Provider Location: Home Office ? ?Virtual Visit via Video  Note . I connected with the patient today by a   video enabled telemedicine application and verified that I am speaking with the correct person using two identifiers. ? ?PCP:  [name], MD  ?Cardiologist:  Berniece Salines, DO  ?Electrophysiologist:  None  ? ?Evaluation Performed:  Follow-Up Visit ? ?Chief Complaint:   ? ?History of Present Illness:   ? ?Shelby Harrington is a 71 y.o. female with hypertension, paroxysmal SVT seen on her recent monitor and rare symptomatic premature atrial complexes. ? ?I saw the patient on September 14, 2020 at that time she experiencing chest discomfort, I send her for a CCTA which did not show any evidence of coronary artery disease.  However her ZIO monitor that showed paroxysmal SVT as well as rare symptomatic PACs. ? ?The patient does not have symptoms concerning for COVID-19 infection (fever, chills, cough, or new shortness of breath).  ? ? ?Past Medical History:  ?Diagnosis Date  ? Family history of malignant neoplasm of breast   ? Family history of uterine cancer   ? 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 Hypertension    ? ?Past Surgical History:  ?Procedure Laterality Date  ? BREAST EXCISIONAL BIOPSY Right 1983  ? Benign  ?  ? ?Current Meds  ?Medication Sig  ? ALPRAZolam (XANAX) 0.25 MG tablet Take 0.25 mg by mouth 2 (two) times daily as needed for anxiety.  ? amLODipine (NORVASC) 5 MG tablet Take 5 mg by mouth daily.  ? aspirin 81 MG tablet Take 81 mg by mouth daily.  ? CALCIUM PO Take 1 capsule by mouth daily. '1200mg'$  daily  ? clotrimazole-betamethasone (LOTRISONE) cream Apply 1 application topically 2 (two) times daily.  ? cyclobenzaprine (FLEXERIL) 10 MG tablet Take 10 mg by mouth at bedtime as needed for muscle spasms.   ? estradiol (ESTRACE) 0.1 MG/GM vaginal cream Place 1 Applicatorful vaginally 2 (two) times a week.  ? fluticasone (FLONASE) 50 MCG/ACT nasal spray daily.  ? Glucosamine-Chondroitin (GLUCOSAMINE CHONDR COMPLEX PO) Take 1 capsule by mouth daily.  ? losartan (COZAAR) 25 MG tablet Take 12.5 mg by mouth daily.  ? magnesium gluconate (MAGONATE) 500 MG tablet Take 500 mg by mouth daily.  ? metoprolol succinate (TOPROL-XL) 25 MG 24 hr tablet Take 25 mg by mouth daily.  ? Multiple Vitamins-Minerals (CENTRUM SILVER PO) Take 1 tablet by mouth daily.  ?  ? ?Allergies:   Prednisone, Latex, Codeine, Penicillins, and Tape  ? ?Social History  ? ?Tobacco Use  ? Smoking status: Never  ? Smokeless tobacco: Never  ?Vaping Use  ? Vaping Use: Never used  ?Substance Use Topics  ? Alcohol use:  Yes  ?  Alcohol/week: 0.0 standard drinks  ?  Comment: glass of wine on the weekends  ? Drug use: No  ?  ? ?Family Hx: ?The patient's family history includes Breast cancer (age of onset: 25) in her mother; Diabetes in her father; Emphysema in her mother; Hypertension in her father and mother; Skin cancer in her father; Uterine cancer (age of onset: 26) in her sister. ? ?ROS:   ?Please see the history of present illness.    ? ?All other systems reviewed and are negative. ? ? ?Prior CV studies:   ?The following studies were reviewed  today: ? ?Zio monitor 10/06/2021 ?Study Highlights ? ?  ?Patch Wear Time:  11 days and 23 hours (2023-03-17T07:39:12-398 to 2023-03-29T06:52:24-0400) ?  ?Patient had a min HR of 43 bpm, max HR of 197 bpm, and avg HR of 62 bpm. Predominant underlying rhythm was Sinus Rhythm. ?16 Supraventricular Tachycardia runs occurred, the run with the fastest interval lasting 7 beats with a max rate of 197 bpm, the  ?longest lasting 20 beats with an avg rate of 125 bpm. Some episodes of Supraventricular Tachycardia may be possible Atrial Tachycardia with variable block. Isolated SVEs were rare (<1.0%), SVE Couplets were rare (<1.0%), and SVE Triplets were rare  ?(<1.0%). Isolated VEs were rare (<1.0%), VE Couplets were rare (<1.0%), and no VE Triplets were present. ?  ?  ?Symptoms associated with sinus tachycardia and rare premature atrial complexes. ?  ?Conclusion: The study is remarkable for the following: ?           1. Paroxysmal supraventricular tachycardia  ?           2. Rare symptomatic premature atrial complexes ? ?CCTA 09/29/2021 ?FINDINGS: ?Image quality: Excellent. ?Noise artifact is: Limited. ?Coronary Arteries:  Normal coronary origin.  Right dominance. ?Left main: The left main is a large caliber vessel with a normal ?take off from the left coronary cusp that bifurcates to form a left ?anterior descending artery and a left circumflex artery. There is no ?plaque or stenosis. ?Left anterior descending artery: The LAD is patent without evidence ?of plaque or stenosis. The LAD gives off 1 patent diagonal branch. ?Left circumflex artery: The LCX is non-dominant and patent with no ?evidence of plaque or stenosis. The LCX gives off 1 patent obtuse ?marginal branch. ? Right coronary artery: The RCA is dominant with normal take off from ?the right coronary cusp. There is no evidence of plaque or stenosis. ?The RCA terminates as a PDA and right posterolateral branch without ?evidence of plaque or stenosis. ?  ?Right Atrium:  Right atrial size is within normal limits. ?  ?Right Ventricle: The right ventricular cavity is within normal ?limits. ?  ?Left Atrium: Left atrial size is normal in size with no left atrial ?appendage filling defect. ?  ?Left Ventricle: The ventricular cavity size is within normal limits. ?There are no stigmata of prior infarction. There is no abnormal ?filling defect. ?  ?Pulmonary arteries: Normal in size without proximal filling defect. ?  ?Pulmonary veins: Normal pulmonary venous drainage. ?  ?Pericardium: Normal thickness with no significant effusion or ?calcium present. ?  ?Cardiac valves: The aortic valve is trileaflet without significant ?calcification. The mitral valve is normal structure without ?significant calcification. ?  ?Aorta: Normal caliber with aortic atherosclerosis. ?  ?Extra-cardiac findings: See attached radiology report for ?non-cardiac structures. ?  ?IMPRESSION: ?1. Coronary calcium score of 0. This was 0 percentile for age-, sex, ?and race-matched controls. ?  ?2.  Normal coronary origin with right dominance. ?  ?3. No evidence of CAD. ?  ?4. Aortic atherosclerosis. ?  ?RECOMMENDATIONS: ?CAD-RADS 0: No evidence of CAD (0%). Consider non-atherosclerotic ?causes of chest pain. ? Kirk Ruths, MD ? Electronically Signed ?  By: Kirk Ruths M.D. ?  On: 09/29/2021 12:06 ? ?Labs/Other Tests and Data Reviewed:   ? ?EKG:  No ECG reviewed. ? ?Recent Labs: ?07/30/2021: ALT 30; Hemoglobin 15.2; Platelet Count 198 ?09/14/2021: BUN 16; Creatinine, Ser 0.83; Magnesium 2.3; Potassium 3.7; Sodium 142  ? ?Recent Lipid Panel ?No results found for: CHOL, TRIG, HDL, CHOLHDL, LDLCALC, LDLDIRECT ? ?Wt Readings from Last 3 Encounters:  ?10/14/21 155 lb (70.3 kg)  ?09/14/21 152 lb 6.4 oz (69.1 kg)  ?07/30/21 147 lb (66.7 kg)  ?  ? ?Objective:   ? ?Vital Signs:  BP 124/87   Pulse 69   Ht '4\' 11"'$  (1.499 m)   Wt 155 lb (70.3 kg)   BMI 31.31 kg/m?   ? ? ? ? ?ASSESSMENT & PLAN:   ? ?PSVT ?Symptomatic PACs ? ?We  discussed her testing result.  She had questions about the findings of the ZIO monitor.  For now we will keep the patient on her current medication.  We talked about cutting back on the caffeine use. ? ?COVID-19 Education: ?T

## 2021-10-18 ENCOUNTER — Encounter: Payer: Self-pay | Admitting: Cardiology

## 2021-10-18 DIAGNOSIS — R9389 Abnormal findings on diagnostic imaging of other specified body structures: Secondary | ICD-10-CM | POA: Diagnosis not present

## 2021-10-18 DIAGNOSIS — N950 Postmenopausal bleeding: Secondary | ICD-10-CM | POA: Diagnosis not present

## 2021-10-18 DIAGNOSIS — N898 Other specified noninflammatory disorders of vagina: Secondary | ICD-10-CM | POA: Diagnosis not present

## 2021-10-18 DIAGNOSIS — R32 Unspecified urinary incontinence: Secondary | ICD-10-CM | POA: Diagnosis not present

## 2021-10-19 ENCOUNTER — Telehealth: Payer: Self-pay | Admitting: *Deleted

## 2021-10-19 NOTE — Telephone Encounter (Signed)
? ?  Pre-operative Risk Assessment  ?  ?Patient Name: Shelby Harrington  ?DOB: 31-May-1951 ?MRN: [id number]  ? ?  ? ?Request for Surgical Clearance   ? ?Procedure:  Hysteroscopy, dilation and curettage with myosure polypectomy ? ?Date of Surgery:  Clearance 11/26/21                              ?   ?Surgeon:  not listed ?Surgeon's Group or Practice Name:  [facility] ?Phone number:  [phone] ?Fax number:  [phone] ?  ?Type of Clearance Requested:   ?- Medical  ?  ?Type of Anesthesia:  office to call back with anesthesia ?  ?Additional requests/questions:   ? ?Signed, ?Fredia Beets   ?10/19/2021, 4:09 PM   ?

## 2021-10-20 NOTE — Telephone Encounter (Signed)
The patient does not have any unstable cardiac conditions.  Upon evaluation today, she can achieve 4 METs or greater without anginal symptoms.  According to [facility] and AHA guidelines, she requires no further cardiac workup prior to her noncardiac surgery and should be at acceptable risk.  Our service is available as necessary in the perioperative period. ?

## 2021-10-20 NOTE — Telephone Encounter (Signed)
Dr. Harriet Masson ?You saw this patient 10/14/21 to discuss zio results. She is now pending Hysteroscopy, dilation and curettage with myosure polypectomy. We are asked for preoperative risk stratification. I do not think she needs to be charged for a tele visit by the preop APP. Can you please provide guidance for clearance? ? ?

## 2021-10-21 NOTE — Telephone Encounter (Signed)
? ?  Patient Name: Shelby Harrington  ?DOB: 1951/06/08 ?MRN: [id number] ? ?Primary Cardiologist: Berniece Salines, DO ? ?Chart reviewed as part of pre-operative protocol coverage. Per Dr. Harriet Masson, "The patient does not have any unstable cardiac conditions.  Upon evaluation today, she can achieve 4 METs or greater without anginal symptoms.  According to [facility] and AHA guidelines, she requires no further cardiac workup prior to her noncardiac surgery and should be at acceptable risk.  Our service is available as necessary in the perioperative period."  ? ?In the event that the patient needs to hold aspirin for the procedure, she may do so, as she does not have any ongoing cardiac issues that would prohibit the interruption of therapy. ? ?Will route this bundled recommendation to requesting provider via Epic fax function. Please call with questions. ? ? ?Charlie Pitter, PA-C ?10/21/2021, 8:30 AM ? ? ?

## 2021-10-22 ENCOUNTER — Telehealth: Payer: Self-pay

## 2021-10-22 DIAGNOSIS — Z86010 Personal history of colonic polyps: Secondary | ICD-10-CM | POA: Diagnosis not present

## 2021-10-22 DIAGNOSIS — E559 Vitamin D deficiency, unspecified: Secondary | ICD-10-CM | POA: Diagnosis not present

## 2021-10-22 DIAGNOSIS — J3089 Other allergic rhinitis: Secondary | ICD-10-CM | POA: Diagnosis not present

## 2021-10-22 DIAGNOSIS — I129 Hypertensive chronic kidney disease with stage 1 through stage 4 chronic kidney disease, or unspecified chronic kidney disease: Secondary | ICD-10-CM | POA: Diagnosis not present

## 2021-10-22 DIAGNOSIS — I700 Atherosclerosis of aorta: Secondary | ICD-10-CM | POA: Diagnosis not present

## 2021-10-22 DIAGNOSIS — Z0000 Encounter for general adult medical examination without abnormal findings: Secondary | ICD-10-CM | POA: Diagnosis not present

## 2021-10-22 DIAGNOSIS — N952 Postmenopausal atrophic vaginitis: Secondary | ICD-10-CM | POA: Diagnosis not present

## 2021-10-22 DIAGNOSIS — N183 Chronic kidney disease, stage 3 unspecified: Secondary | ICD-10-CM | POA: Diagnosis not present

## 2021-10-22 DIAGNOSIS — F5101 Primary insomnia: Secondary | ICD-10-CM | POA: Diagnosis not present

## 2021-10-22 NOTE — Telephone Encounter (Signed)
? ?  Primary Cardiologist: Berniece Salines, DO ? ?Chart reviewed as part of pre-operative protocol coverage. Given past medical history and time since last visit, based on ACC/AHA guidelines, Shelby Harrington would be at acceptable risk for the planned procedure without further cardiovascular testing.  ? ?Her RCRI is a class II risk, 0.9% risk of major cardiac event. ? ?I will route this recommendation to the requesting party via Epic fax function and remove from pre-op pool. ? ?Please call with questions. ? ?Jossie Ng. Ladell Lea NP-C ? ?  ?10/22/2021, 8:12 AM ?[place] ?[address] ?Office [phone] Fax [phone] ? ? ? ? ?

## 2021-10-22 NOTE — Telephone Encounter (Signed)
? ?  Pre-operative Risk Assessment  ?  ?Patient Name: Shelby Harrington  ?DOB: 1950-09-15 ?MRN: [id number]  ? ?  ? ?Request for Surgical Clearance   ? ?Procedure:  hysteroscopy, dilatation and curettage with myosure polypectomy ? ?Date of Surgery:  Clearance 11/26/21                              ?   ?Surgeon:  not indicated  ?Surgeon's Group or Practice Name:  [facility] ?Phone number:  [phone] ?Fax number:  [phone] ?  ?Type of Clearance Requested:   ?- Medical  ?  ?Type of Anesthesia:  General  ?  ?Additional requests/questions:   ? ?Signed, ?Kathreen Devoid   ?10/22/2021, 7:33 AM  ? ?

## 2021-10-28 ENCOUNTER — Encounter: Payer: Self-pay | Admitting: Cardiology

## 2021-10-29 ENCOUNTER — Inpatient Hospital Stay: Payer: PPO | Attending: Family

## 2021-10-29 ENCOUNTER — Telehealth: Payer: Self-pay | Admitting: *Deleted

## 2021-10-29 ENCOUNTER — Inpatient Hospital Stay (HOSPITAL_BASED_OUTPATIENT_CLINIC_OR_DEPARTMENT_OTHER): Payer: PPO | Admitting: Family

## 2021-10-29 ENCOUNTER — Encounter: Payer: Self-pay | Admitting: Family

## 2021-10-29 ENCOUNTER — Other Ambulatory Visit: Payer: Self-pay

## 2021-10-29 DIAGNOSIS — Z888 Allergy status to other drugs, medicaments and biological substances status: Secondary | ICD-10-CM | POA: Diagnosis not present

## 2021-10-29 DIAGNOSIS — Z885 Allergy status to narcotic agent status: Secondary | ICD-10-CM | POA: Insufficient documentation

## 2021-10-29 DIAGNOSIS — Z7982 Long term (current) use of aspirin: Secondary | ICD-10-CM | POA: Insufficient documentation

## 2021-10-29 DIAGNOSIS — R0602 Shortness of breath: Secondary | ICD-10-CM | POA: Insufficient documentation

## 2021-10-29 DIAGNOSIS — R002 Palpitations: Secondary | ICD-10-CM | POA: Insufficient documentation

## 2021-10-29 DIAGNOSIS — R5383 Other fatigue: Secondary | ICD-10-CM | POA: Insufficient documentation

## 2021-10-29 DIAGNOSIS — Z880 Allergy status to penicillin: Secondary | ICD-10-CM | POA: Insufficient documentation

## 2021-10-29 DIAGNOSIS — Z79899 Other long term (current) drug therapy: Secondary | ICD-10-CM | POA: Insufficient documentation

## 2021-10-29 LAB — CBC WITH DIFFERENTIAL (CANCER CENTER ONLY)
Abs Immature Granulocytes: 0.01 10*3/uL (ref 0.00–0.07)
Basophils Absolute: 0.1 10*3/uL (ref 0.0–0.1)
Basophils Relative: 1 %
Eosinophils Absolute: 0.2 10*3/uL (ref 0.0–0.5)
Eosinophils Relative: 4 %
HCT: 45.9 % (ref 36.0–46.0)
Hemoglobin: 15.6 g/dL — ABNORMAL HIGH (ref 12.0–15.0)
Immature Granulocytes: 0 %
Lymphocytes Relative: 30 %
Lymphs Abs: 2 10*3/uL (ref 0.7–4.0)
MCH: 30.2 pg (ref 26.0–34.0)
MCHC: 34 g/dL (ref 30.0–36.0)
MCV: 88.8 fL (ref 80.0–100.0)
Monocytes Absolute: 0.7 10*3/uL (ref 0.1–1.0)
Monocytes Relative: 11 %
Neutro Abs: 3.8 10*3/uL (ref 1.7–7.7)
Neutrophils Relative %: 54 %
Platelet Count: 214 10*3/uL (ref 150–400)
RBC: 5.17 MIL/uL — ABNORMAL HIGH (ref 3.87–5.11)
RDW: 12.6 % (ref 11.5–15.5)
WBC Count: 6.9 10*3/uL (ref 4.0–10.5)
nRBC: 0 % (ref 0.0–0.2)

## 2021-10-29 LAB — CMP (CANCER CENTER ONLY)
ALT: 21 U/L (ref 0–44)
AST: 26 U/L (ref 15–41)
Albumin: 4 g/dL (ref 3.5–5.0)
Alkaline Phosphatase: 101 U/L (ref 38–126)
Anion gap: 8 (ref 5–15)
BUN: 16 mg/dL (ref 8–23)
CO2: 28 mmol/L (ref 22–32)
Calcium: 9.8 mg/dL (ref 8.9–10.3)
Chloride: 107 mmol/L (ref 98–111)
Creatinine: 1.12 mg/dL — ABNORMAL HIGH (ref 0.44–1.00)
GFR, Estimated: 53 mL/min — ABNORMAL LOW (ref >60)
Glucose, Bld: 90 mg/dL (ref 70–99)
Potassium: 4.3 mmol/L (ref 3.5–5.1)
Sodium: 143 mmol/L (ref 135–145)
Total Bilirubin: 0.3 mg/dL (ref 0.3–1.2)
Total Protein: 6.7 g/dL (ref 6.5–8.1)

## 2021-10-29 LAB — FERRITIN: Ferritin: 12 ng/mL (ref 11–307)

## 2021-10-29 NOTE — Progress Notes (Signed)
?Hematology and Oncology Follow Up Visit ? ?Shelby Harrington ?[id number] ?24-Feb-1951 71 y.o. ?10/29/2021 ? ? ?Principle Diagnosis:   ?Hemochromatosis - Heterozygous H63D mutation ?  ?Current Therapy:        ?Aspirin 81 mg PO daily ?Phlebotomy to keep ferritin < 50 and iron sat < 30%  ?  ?Interim History:  Shelby Harrington is here today for follow-up. She is feeling fatigued.  ?She is scheduled for [facility] with gynecology to remove what sounds like some uterine polyps and collect biopsies. She has had some occasional vaginal blood loss.  ?No other blood loss noted. No bruising or petechiae.  ?She is also seeing cardiology for recent diagnosis of PSVT with PAC. She notes occasional palpitations. She states that Dr. Harriet Masson is managing this with medication.  ?No fever, chills, n/v, cough, rash, dizziness, chest pain, abdominal pain or changes in bowel or bladder habits.  ?She has occasional mild SOB with over exertion and takes a break to rest when needed.  ?No swelling, tenderness, numbness or tingling in her extremities at this time.  ?No falls or syncope reported.  ?She has been eating well and is doing her best to stay well hydrated. Her weight is stable at 153 lbs.  ?She has been caring for her father who was recently moved into an assisted living facility. This has been a little stressful for her.  ? ?ECOG Performance Status: 1 - Symptomatic but completely ambulatory ? ?Medications:  ?Allergies as of 10/29/2021   ? ?   Reactions  ? Prednisone Palpitations  ? Latex Other (See Comments)  ? Skin blisters  ? Codeine Rash  ? Penicillins Rash  ? Tape Rash  ? Adhesive tape  ? ?  ? ?  ?Medication List  ?  ? ?  ? Accurate as of October 29, 2021  1:24 PM. If you have any questions, ask your nurse or doctor.  ?  ?  ? ?  ? ?ALPRAZolam 0.25 MG tablet ?Commonly known as: Duanne Moron ?Take 0.25 mg by mouth 2 (two) times daily as needed for anxiety. ?  ?amLODipine 5 MG tablet ?Commonly known as: NORVASC ?Take 5 mg by mouth daily. ?  ?aspirin 81 MG  tablet ?Take 81 mg by mouth daily. ?  ?CALCIUM PO ?Take 1 capsule by mouth daily. '1200mg'$  daily ?  ?CENTRUM SILVER PO ?Take 1 tablet by mouth daily. ?  ?Cetirizine HCl 10 MG Caps ?Commonly known as: ZyrTEC Allergy ?One at bedtime ?  ?clotrimazole-betamethasone cream ?Commonly known as: LOTRISONE ?Apply 1 application topically 2 (two) times daily. ?  ?cyclobenzaprine 10 MG tablet ?Commonly known as: FLEXERIL ?Take 10 mg by mouth at bedtime as needed for muscle spasms. ?  ?estradiol 0.1 MG/GM vaginal cream ?Commonly known as: ESTRACE ?Place 1 Applicatorful vaginally 2 (two) times a week. ?  ?fluticasone 50 MCG/ACT nasal spray ?Commonly known as: FLONASE ?daily. ?  ?GLUCOSAMINE CHONDR COMPLEX PO ?Take 1 capsule by mouth daily. ?  ?losartan 25 MG tablet ?Commonly known as: COZAAR ?Take 12.5 mg by mouth daily. ?  ?magnesium gluconate 500 MG tablet ?Commonly known as: MAGONATE ?Take 500 mg by mouth daily. ?  ?metoprolol succinate 25 MG 24 hr tablet ?Commonly known as: TOPROL-XL ?Take 25 mg by mouth daily. ?  ?metoprolol tartrate 50 MG tablet ?Commonly known as: LOPRESSOR ?Take 2 hours before CT scan ?  ? ?  ? ? ?Allergies:  ?Allergies  ?Allergen Reactions  ? Prednisone Palpitations  ? Latex Other (See Comments)  ?  Skin blisters  ? Codeine Rash  ? Penicillins Rash  ? Tape Rash  ?  Adhesive tape  ? ? ?Past Medical History, Surgical history, Social history, and Family History were reviewed and updated. ? ?Review of Systems: ?All other 10 point review of systems is negative.  ? ?Physical Exam: ? vitals were not taken for this visit.  ? ?Wt Readings from Last 3 Encounters:  ?10/14/21 155 lb (70.3 kg)  ?09/14/21 152 lb 6.4 oz (69.1 kg)  ?07/30/21 147 lb (66.7 kg)  ? ? ?Ocular: Sclerae unicteric, pupils equal, round and reactive to light ?Ear-nose-throat: Oropharynx clear, dentition fair ?Lymphatic: No cervical or supraclavicular adenopathy ?Lungs no rales or rhonchi, good excursion bilaterally ?Heart regular rate and rhythm, no  murmur appreciated ?Abd soft, nontender, positive bowel sounds ?MSK no focal spinal tenderness, no joint edema ?Neuro: non-focal, well-oriented, appropriate affect ?Breasts: Deferred  ? ?Lab Results  ?Component Value Date  ? WBC 7.2 07/30/2021  ? HGB 15.2 (H) 07/30/2021  ? HCT 45.5 07/30/2021  ? MCV 93.6 07/30/2021  ? PLT 198 07/30/2021  ? ?Lab Results  ?Component Value Date  ? FERRITIN 159 07/30/2021  ? IRON 150 07/30/2021  ? TIBC 340 07/30/2021  ? UIBC 190 07/30/2021  ? IRONPCTSAT 44 (H) 07/30/2021  ? ?Lab Results  ?Component Value Date  ? RBC 4.86 07/30/2021  ? ?No results found for: KPAFRELGTCHN, LAMBDASER, KAPLAMBRATIO ?No results found for: IGGSERUM, IGA, IGMSERUM ?No results found for: TOTALPROTELP, ALBUMINELP, A1GS, A2GS, BETS, BETA2SER, GAMS, MSPIKE, SPEI ?  Chemistry   ?   ?Component Value Date/Time  ? NA 142 09/14/2021 0921  ? NA 148 (H) 06/29/2017 0813  ? NA 143 03/30/2017 0805  ? K 3.7 09/14/2021 0921  ? K 3.8 06/29/2017 0813  ? K 4.0 03/30/2017 0805  ? CL 106 09/14/2021 0921  ? CL 105 06/29/2017 0813  ? CO2 24 09/14/2021 0921  ? CO2 28 06/29/2017 0813  ? CO2 26 03/30/2017 0805  ? BUN 16 09/14/2021 0921  ? BUN 21 06/29/2017 0813  ? BUN 18.2 03/30/2017 0805  ? CREATININE 0.83 09/14/2021 0921  ? CREATININE 0.92 07/30/2021 1106  ? CREATININE 1.3 (H) 06/29/2017 0813  ? CREATININE 1.0 03/30/2017 0805  ?    ?Component Value Date/Time  ? CALCIUM 9.2 09/14/2021 0921  ? CALCIUM 9.5 06/29/2017 0813  ? CALCIUM 9.6 03/30/2017 0805  ? ALKPHOS 103 07/30/2021 1106  ? ALKPHOS 78 06/29/2017 0813  ? ALKPHOS 73 03/30/2017 0805  ? AST 25 07/30/2021 1106  ? AST 29 03/30/2017 0805  ? ALT 30 07/30/2021 1106  ? ALT 32 06/29/2017 0813  ? ALT 26 03/30/2017 0805  ? BILITOT 0.4 07/30/2021 1106  ? BILITOT 0.67 03/30/2017 0805  ?  ? ? ? ?Impression and Plan: Shelby Harrington is a very pleasant 71 yo caucasian female with hemochromatosis, heterozygous for the H63D mutation.  ?Iron studies are pending. We will set her up for phlebotomy if  needed.  ?Lab check in 3 months, follow-up in 6 months.  ? ?Lottie Dawson, NP ?4/28/20231:24 PM ? ?

## 2021-10-29 NOTE — Telephone Encounter (Signed)
Per 10/29/21 los - gave upcoming appointments - confirmed ?

## 2021-11-01 ENCOUNTER — Other Ambulatory Visit: Payer: Self-pay

## 2021-11-01 LAB — IRON AND IRON BINDING CAPACITY (CC-WL,HP ONLY)
Iron: 76 ug/dL (ref 28–170)
Saturation Ratios: 16 % (ref 10.4–31.8)
TIBC: 487 ug/dL — ABNORMAL HIGH (ref 250–450)
UIBC: 411 ug/dL (ref 148–442)

## 2021-11-01 MED ORDER — METOPROLOL SUCCINATE ER 25 MG PO TB24: ORAL_TABLET | ORAL | 3 refills | Status: DC

## 2021-11-01 NOTE — Progress Notes (Signed)
Prescription sent to pharmacy.

## 2021-11-10 NOTE — H&P (Signed)
Shelby Harrington is an 71 y.o. postmenopausal G2P2 who is admitted for Hysteroscopy with D&C with Myosure for PMB and thickened endometrium.  ? ?Reports only a small amount of blood on her finger, found incidentally when using Estradiol cream. Offered in office EMB vs outpatient Hysteroscopy - due to concern for pain with in office EMB, patient opted for Hysteroscopy D&C. ? ?Of note, patient has history of PSVT for which she uses Metoprolol. Follows with [facility]. She has received surgical clearance prior to procedure and did not require any further cardiac work-up (document in patient documents). ? ?Work-up: ?Pap smear (07/2021): NILM ? ?TVUS (10/18/21): ?Uterus: 6.47 x 2.82 x 3.57 cm ?Endometrial thickness: 0.81 cm ?Right Ovary 1.23 cm  Left Ovary not seen ?Comments: ?Anteverted uterus. No uterine anomalies seen. Endometrium thickened and ?echogenic in appearance with tiny cystic areas within. No bloodflow noted. ?Right ovary WNL. Left ovary not visualized. No adnexal masses seen. ? ? ?Patient Active Problem List  ? Diagnosis Date Noted  ? PSVT (paroxysmal supraventricular tachycardia) ([place]) 10/14/2021  ? PAC (premature atrial contraction) 10/14/2021  ? Morbid obesity due to excess calories ([place]) 11/12/2016  ? Genetic testing 04/25/2016  ? 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 Cough variant asthma vs UACS 09/16/2015  ? Dyspnea 09/16/2015  ? Family history of malignant neoplasm of breast   ? Family history of uterine cancer   ? ? ?MEDICAL/FAMILY/SOCIAL HX: ?No LMP recorded. Patient is postmenopausal. ?  ? ?Past Medical History:  ?Diagnosis Date  ? Family history of malignant neoplasm of breast   ? Family history of uterine cancer   ? 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 Hypertension   ? ? ?Past Surgical History:  ?Procedure Laterality Date  ? BREAST EXCISIONAL BIOPSY Right 1983  ? Benign  ? ? ?Family History  ?Problem Relation Age of Onset   ? Breast cancer Mother 5  ?     deceased at 24  ? Hypertension Mother   ? Emphysema Mother   ?     smoked  ? Skin cancer Father   ?     currently 36; no full siblings  ? Hypertension Father   ? Diabetes Father   ? Uterine cancer Sister 64  ?     currently 2  ? ? ?Social History:  reports that she has never smoked. She has never used smokeless tobacco. She reports current alcohol use. She reports that she does not use drugs. ? ?ALLERGIES/MEDS: ? ?Allergies:  ?Allergies  ?Allergen Reactions  ? Prednisone Palpitations  ? Latex Other (See Comments)  ?  Skin blisters  ? Codeine Rash  ? Penicillins Rash  ? Tape Rash  ?  Adhesive tape  ? ? ?No medications prior to admission.  ? ? ? ?Review of Systems  ?Constitutional: Negative.   ?HENT: Negative.    ?Eyes: Negative.   ?Respiratory: Negative.    ?Cardiovascular: Negative.   ?Gastrointestinal: Negative.   ?Genitourinary: Negative.   ?Musculoskeletal: Negative.   ?Skin: Negative.   ?Neurological: Negative.   ?Endo/Heme/Allergies: Negative.   ?Psychiatric/Behavioral: Negative.    ? ?There were no vitals taken for this visit. ?Gen:  NAD, pleasant and cooperative ?Cardio:  RRR ?Pulm:  CTAB, no wheezes/rales/rhonchi ?Abd:  Soft, non-distended, non-tender throughout, no rebound/guarding ?Ext:  No bilateral LE edema, no bilateral calf tendernes ?Pelvic: Labia - unremarkable, vagina - pink moist mucosa, no lesions or abnormal discharge, cervix - no discharge or  lesions or CMT, adnexa - no masses or tenderness - uterus non-tender and normal size on palpation, atrophic changes noted ? ? ?No results found for this or any previous visit (from the past 24 hour(s)). ? ?No results found. ? ? ?ASSESSMENT/PLAN: ?Shelby Harrington is a 71 y.o. postmenopausal G2P2 who is admitted for Hysteroscopy with D&C with Myosure for PMB and thickened endometrium.  ? ?- Admit to [facility] Main OR ?- Admit labs (CBC, T&S, COVID screen) ?- Diet:  Per anesthesia ?- IVF:  Per anesthesia ?- VTE Prophylaxis:   SCDs ?- Antibiotics: None ?- Same-day discharge home ? ?Consents: ?I discussed with the patient that this surgery is performed to look inside the uterus and remove the uterine lining.  Prior to surgery, the risks and benefits of the surgery, as well as alternative treatments, have been discussed.  The risks include, but are not limited to bleeding, including the need for a blood transfusion, infection, damage to organs and tissues, including uterine perforation, requiring additional surgery, postoperative pain, short-term and long-term, failure of the procedure to control symptoms, need for hysterectomy to control bleeding, fluid overload, which could create electrolyte abnormalities and the need to stop the procedure before completion, inability to safely complete the procedure, deep vein thrombosis and/or pulmonary embolism, painful intercourse, complications the course of which cannot be predicted or prevented, and death.  ?Patient was consented for blood products.  The patient is aware that bleeding may result in the need for a blood transfusion which includes risk of transmission of HIV (1:2 million), Hepatitis C (1:2 million), and Hepatitis B (1:200 thousand) and transfusion reaction.  Patient voiced understanding of the above risks as well as understanding of indications for blood transfusion. ? ?[name], DO ?[phone] (office) ? ? ? ?

## 2021-11-12 DIAGNOSIS — Z01818 Encounter for other preprocedural examination: Secondary | ICD-10-CM | POA: Diagnosis not present

## 2021-11-12 DIAGNOSIS — N950 Postmenopausal bleeding: Secondary | ICD-10-CM | POA: Diagnosis not present

## 2021-11-19 ENCOUNTER — Encounter: Payer: Self-pay | Admitting: Cardiology

## 2021-11-25 ENCOUNTER — Encounter (HOSPITAL_COMMUNITY): Payer: Self-pay | Admitting: Obstetrics and Gynecology

## 2021-11-25 NOTE — Progress Notes (Signed)
PCP - Dr. Dema Severin  Cardiologist -  EKG -  Chest x-ray -  ECHO -  Cardiac Cath -   Blood Thinner Instructions:  Aspirin Instructions: Follow your surgeon's instructions on when to stop Aspirin.  If no instructions were given by your surgeon then you will need to call the office to get those instructions.    ERAS Protcol - 0915 COVID TEST-   Anesthesia review: n/a  -------------  SDW INSTRUCTIONS:  Your procedure is scheduled on 5/26. Please report to [facility] Main Entrance "A" at 09:45 A.M., and check in at the Admitting office. Call this number if you have problems the morning of surgery: [phone]   Remember: Do not eat after midnight the night before your surgery  You may drink clear liquids until 0915 the morning of your surgery.   Clear liquids allowed are: Water, Non-Citrus Juices (without pulp), Carbonated Beverages, Clear Tea, Black Coffee Only, and Gatorade   Medications to take morning of surgery with a sip of water include: acetaminophen (TYLENOL)  ALPRAZolam (XANAX)  amLODipine (NORVASC) metoprolol succinate (TOPROL-XL) famotidine (PEPCID)    As of today, STOP taking any Aspirin (unless otherwise instructed by your surgeon), Aleve, Naproxen, Ibuprofen, Motrin, Advil, Goody's, BC's, all herbal medications, fish oil, and all vitamins.    The Morning of Surgery Do not wear jewelry, make-up or nail polish. Do not wear lotions, powders, or perfumes, or deodorant Do not bring valuables to the hospital. [facility] is not responsible for any belongings or valuables.  If you are a smoker, DO NOT Smoke 24 hours prior to surgery  If you wear a CPAP at night please bring your mask the morning of surgery   Remember that you must have someone to transport you home after your surgery, and remain with you for 24 hours if you are discharged the same day.  Please bring cases for contacts, glasses, hearing aids, dentures or bridgework because it cannot be worn into  surgery.   Patients discharged the day of surgery will not be allowed to drive home.   Please shower the NIGHT BEFORE/MORNING OF SURGERY (use antibacterial soap like DIAL soap if possible). Wear comfortable clothes the morning of surgery. Oral Hygiene is also important to reduce your risk of infection.  Remember - BRUSH YOUR TEETH THE MORNING OF SURGERY WITH YOUR REGULAR TOOTHPASTE  Patient denies shortness of breath, fever, cough and chest pain.

## 2021-11-26 ENCOUNTER — Ambulatory Visit (HOSPITAL_COMMUNITY): Payer: PPO

## 2021-11-26 ENCOUNTER — Other Ambulatory Visit: Payer: Self-pay

## 2021-11-26 ENCOUNTER — Encounter (HOSPITAL_COMMUNITY)
Admission: RE | Disposition: A | Discharge status: Home / Self Care | Payer: Self-pay | Source: Home / Self Care | Attending: Obstetrics and Gynecology

## 2021-11-26 ENCOUNTER — Encounter (HOSPITAL_COMMUNITY): Payer: Self-pay | Admitting: Obstetrics and Gynecology

## 2021-11-26 ENCOUNTER — Ambulatory Visit (HOSPITAL_COMMUNITY)
Admission: RE | Admit: 2021-11-26 | Discharge: 2021-11-26 | Disposition: A | Discharge status: Home / Self Care | Payer: PPO | Attending: Obstetrics and Gynecology | Admitting: Obstetrics and Gynecology

## 2021-11-26 ENCOUNTER — Ambulatory Visit (HOSPITAL_BASED_OUTPATIENT_CLINIC_OR_DEPARTMENT_OTHER): Payer: PPO

## 2021-11-26 DIAGNOSIS — D759 Disease of blood and blood-forming organs, unspecified: Secondary | ICD-10-CM | POA: Diagnosis not present

## 2021-11-26 DIAGNOSIS — R9389 Abnormal findings on diagnostic imaging of other specified body structures: Secondary | ICD-10-CM | POA: Diagnosis not present

## 2021-11-26 DIAGNOSIS — Z6831 Body mass index (BMI) 31.0-31.9, adult: Secondary | ICD-10-CM | POA: Diagnosis not present

## 2021-11-26 DIAGNOSIS — N840 Polyp of corpus uteri: Secondary | ICD-10-CM | POA: Insufficient documentation

## 2021-11-26 DIAGNOSIS — I471 Supraventricular tachycardia: Secondary | ICD-10-CM | POA: Diagnosis not present

## 2021-11-26 DIAGNOSIS — E669 Obesity, unspecified: Secondary | ICD-10-CM | POA: Diagnosis not present

## 2021-11-26 DIAGNOSIS — N950 Postmenopausal bleeding: Secondary | ICD-10-CM | POA: Insufficient documentation

## 2021-11-26 HISTORY — PX: DILATATION & CURETTAGE/HYSTEROSCOPY WITH MYOSURE: SHX6511

## 2021-11-26 LAB — TYPE AND SCREEN
ABO/RH(D): O POS
Antibody Screen: NEGATIVE

## 2021-11-26 LAB — CBC
HCT: 47.3 % — ABNORMAL HIGH (ref 36.0–46.0)
Hemoglobin: 16.1 g/dL — ABNORMAL HIGH (ref 12.0–15.0)
MCH: 30 pg (ref 26.0–34.0)
MCHC: 34 g/dL (ref 30.0–36.0)
MCV: 88.1 fL (ref 80.0–100.0)
Platelets: 211 10*3/uL (ref 150–400)
RBC: 5.37 MIL/uL — ABNORMAL HIGH (ref 3.87–5.11)
RDW: 14.5 % (ref 11.5–15.5)
WBC: 6 10*3/uL (ref 4.0–10.5)
nRBC: 0 % (ref 0.0–0.2)

## 2021-11-26 LAB — ABO/RH: ABO/RH(D): O POS

## 2021-11-26 SURGERY — DILATATION & CURETTAGE/HYSTEROSCOPY WITH MYOSURE
Anesthesia: General | Site: Vagina

## 2021-11-26 MED ORDER — OXYCODONE HCL 5 MG PO TABS
5.0000 mg | ORAL_TABLET | Freq: Once | ORAL | Status: AC | PRN
  Administered 2021-11-26: 5 mg via ORAL

## 2021-11-26 MED ORDER — MIDAZOLAM HCL 2 MG/2ML IJ SOLN
INTRAMUSCULAR | Status: AC
  Filled 2021-11-26: qty 2

## 2021-11-26 MED ORDER — ONDANSETRON HCL 4 MG/2ML IJ SOLN: 4.0000 mg | Freq: Once | INTRAMUSCULAR | Status: DC | PRN

## 2021-11-26 MED ORDER — CHLORHEXIDINE GLUCONATE 0.12 % MT SOLN
15.0000 mL | Freq: Once | OROMUCOSAL | Status: AC
  Administered 2021-11-26: 15 mL via OROMUCOSAL
  Filled 2021-11-26: qty 15

## 2021-11-26 MED ORDER — ACETAMINOPHEN 10 MG/ML IV SOLN
INTRAVENOUS | Status: DC | PRN
  Administered 2021-11-26: 1000 mg via INTRAVENOUS

## 2021-11-26 MED ORDER — PHENYLEPHRINE 80 MCG/ML (10ML) SYRINGE FOR IV PUSH (FOR BLOOD PRESSURE SUPPORT)
PREFILLED_SYRINGE | INTRAVENOUS | Status: DC | PRN
  Administered 2021-11-26 (×2): 80 ug via INTRAVENOUS
  Administered 2021-11-26: 120 ug via INTRAVENOUS

## 2021-11-26 MED ORDER — PROPOFOL 10 MG/ML IV BOLUS
INTRAVENOUS | Status: DC | PRN
  Administered 2021-11-26: 170 mg via INTRAVENOUS
  Administered 2021-11-26: 20 mg via INTRAVENOUS

## 2021-11-26 MED ORDER — DEXAMETHASONE SODIUM PHOSPHATE 10 MG/ML IJ SOLN
INTRAMUSCULAR | Status: DC | PRN
  Administered 2021-11-26: 10 mg via INTRAVENOUS

## 2021-11-26 MED ORDER — MIDAZOLAM HCL 2 MG/2ML IJ SOLN
INTRAMUSCULAR | Status: DC | PRN
  Administered 2021-11-26: 2 mg via INTRAVENOUS

## 2021-11-26 MED ORDER — PROPOFOL 10 MG/ML IV BOLUS
INTRAVENOUS | Status: AC
  Filled 2021-11-26: qty 20

## 2021-11-26 MED ORDER — LIDOCAINE 2% (20 MG/ML) 5 ML SYRINGE
INTRAMUSCULAR | Status: DC | PRN
  Administered 2021-11-26: 60 mg via INTRAVENOUS

## 2021-11-26 MED ORDER — ACETAMINOPHEN 10 MG/ML IV SOLN
INTRAVENOUS | Status: AC
  Filled 2021-11-26: qty 100

## 2021-11-26 MED ORDER — OXYCODONE HCL 5 MG/5ML PO SOLN: 5.0000 mg | Freq: Once | ORAL | Status: AC | PRN

## 2021-11-26 MED ORDER — ORAL CARE MOUTH RINSE: 15.0000 mL | Freq: Once | OROMUCOSAL | Status: AC

## 2021-11-26 MED ORDER — FENTANYL CITRATE (PF) 100 MCG/2ML IJ SOLN: 25.0000 ug | INTRAMUSCULAR | Status: DC | PRN

## 2021-11-26 MED ORDER — 0.9 % SODIUM CHLORIDE (POUR BTL) OPTIME
TOPICAL | Status: DC | PRN
  Administered 2021-11-26: 1000 mL

## 2021-11-26 MED ORDER — PHENYLEPHRINE 80 MCG/ML (10ML) SYRINGE FOR IV PUSH (FOR BLOOD PRESSURE SUPPORT)
PREFILLED_SYRINGE | INTRAVENOUS | Status: AC
  Filled 2021-11-26: qty 10

## 2021-11-26 MED ORDER — LACTATED RINGERS IV SOLN: INTRAVENOUS | Status: DC

## 2021-11-26 MED ORDER — SILVER NITRATE-POT NITRATE 75-25 % EX MISC
CUTANEOUS | Status: DC | PRN
  Administered 2021-11-26 (×2): 1

## 2021-11-26 MED ORDER — FENTANYL CITRATE (PF) 250 MCG/5ML IJ SOLN
INTRAMUSCULAR | Status: AC
  Filled 2021-11-26: qty 5

## 2021-11-26 MED ORDER — SODIUM CHLORIDE 0.9 % IR SOLN
Status: DC | PRN
Start: 2021-11-26 — End: 2021-11-26
  Administered 2021-11-26: 3000 mL

## 2021-11-26 MED ORDER — PHENYLEPHRINE HCL-NACL 20-0.9 MG/250ML-% IV SOLN
INTRAVENOUS | Status: DC | PRN
  Administered 2021-11-26: 20 ug/min via INTRAVENOUS

## 2021-11-26 MED ORDER — ONDANSETRON HCL 4 MG/2ML IJ SOLN
INTRAMUSCULAR | Status: DC | PRN
  Administered 2021-11-26: 4 mg via INTRAVENOUS

## 2021-11-26 MED ORDER — KETAMINE HCL 10 MG/ML IJ SOLN
INTRAMUSCULAR | Status: DC | PRN
  Administered 2021-11-26 (×2): 10 mg via INTRAVENOUS

## 2021-11-26 MED ORDER — FENTANYL CITRATE (PF) 250 MCG/5ML IJ SOLN
INTRAMUSCULAR | Status: DC | PRN
  Administered 2021-11-26: 50 ug via INTRAVENOUS

## 2021-11-26 MED ORDER — KETAMINE HCL 50 MG/5ML IJ SOSY
PREFILLED_SYRINGE | INTRAMUSCULAR | Status: AC
  Filled 2021-11-26: qty 5

## 2021-11-26 MED ORDER — OXYCODONE HCL 5 MG PO TABS
ORAL_TABLET | ORAL | Status: DC
Start: 2021-11-26 — End: 2021-11-26
  Filled 2021-11-26: qty 1

## 2021-11-26 SURGICAL SUPPLY — 17 items
CANISTER SUCT 3000ML PPV (MISCELLANEOUS) ×3 IMPLANT
CATH ROBINSON RED A/P 16FR (CATHETERS) ×3 IMPLANT
DEVICE MYOSURE LITE (MISCELLANEOUS) ×1 IMPLANT
DEVICE MYOSURE REACH (MISCELLANEOUS) IMPLANT
DILATOR CANAL MILEX (MISCELLANEOUS) IMPLANT
GLOVE BIO SURGEON STRL SZ 6.5 (GLOVE) ×3 IMPLANT
GLOVE SURG ENC TEXT LTX SZ6.5 (GLOVE) ×3 IMPLANT
GLOVE SURG UNDER POLY LF SZ7 (GLOVE) ×3 IMPLANT
GOWN STRL REUS W/ TWL LRG LVL3 (GOWN DISPOSABLE) ×4 IMPLANT
GOWN STRL REUS W/TWL LRG LVL3 (GOWN DISPOSABLE) ×4
KIT PROCEDURE FLUENT (KITS) ×3 IMPLANT
KIT TURNOVER KIT B (KITS) ×3 IMPLANT
PACK VAGINAL MINOR WOMEN LF (CUSTOM PROCEDURE TRAY) ×3 IMPLANT
PAD OB MATERNITY 4.3X12.25 (PERSONAL CARE ITEMS) ×3 IMPLANT
SEAL ROD LENS SCOPE MYOSURE (ABLATOR) ×3 IMPLANT
TOWEL GREEN STERILE FF (TOWEL DISPOSABLE) ×6 IMPLANT
UNDERPAD 30X36 HEAVY ABSORB (UNDERPADS AND DIAPERS) ×3 IMPLANT

## 2021-11-26 NOTE — Anesthesia Procedure Notes (Signed)
Procedure Name: LMA Insertion Date/Time: 11/26/2021 12:23 PM Performed by: Harden Mo, CRNA Pre-anesthesia Checklist: Patient identified, Emergency Drugs available, Suction available and Patient being monitored Patient Re-evaluated:Patient Re-evaluated prior to induction Oxygen Delivery Method: Circle System Utilized Preoxygenation: Pre-oxygenation with 100% oxygen Induction Type: IV induction Ventilation: Mask ventilation without difficulty LMA: LMA inserted LMA Size: 4.0 Number of attempts: 1 Airway Equipment and Method: Bite block Placement Confirmation: positive ETCO2 and breath sounds checked- equal and bilateral Tube secured with: Tape Dental Injury: Teeth and Oropharynx as per pre-operative assessment

## 2021-11-26 NOTE — Op Note (Signed)
Pre Op Dx:   1. Postmenopausal bleeding 2. Thickened endometrium on ultrasound (0.81cm)  Post Op Dx:   Same as pre-operative diagnoses  Procedure:   1. Hysteroscopy with Dilation and Curettage   Surgeon:  Dr. [name] Assistants:  None Anesthesia:  General   EBL:  5cc  IVF:  700cc UOP:  75cc Fluid Deficit:  <200cc   Drains:  In-and-out foley catheter Specimen removed:  Endometrial curettings - sent to pathology Device(s) implanted: None Case Type:  Clean-contaminated Findings:  Normal, atrophic appearing cervix. Endometrium atrophic, no polyps noted. Right tubal ostia visualized. Left tubal ostia difficult to visualize clearly. Complications: None Indications:  71 y.o. postmenopausal G2P2 with postmenopausal bleeding (spotting) and thickened endometrium on ultrasound (0.81cm). She declined outpatient EMB. Description of each procedure:  After informed consent was obtained the patient was taken to the operating room in the dorsal supine position.  After administration of general anesthesia, the patient was placed in the dorsal lithotomy position and prepped and draped in the usual sterile fashion.  Her bladder was emptied using an in and out catheter.  A pre-operative time-out was completed.  The anterior lip of the cervix was grasped with a single-tooth tenaculum and the cervix was serially dilated to accommodate the hysteroscope.  The hysteroscope was advanced and the findings as above was noted. The Myosure Lite was used to sample the endometrium. A sharp banjo curette was used to curettage the endometrium. The single-tooth tenaculum was removed and its sites were made hemostatic with silver nitrate.  Adequate hemostasis was noted.  The patient was awakened and extubated and appeared to have tolerated the procedure well.  All counts were correct.  Disposition:  PACU  [name], DO

## 2021-11-26 NOTE — Transfer of Care (Signed)
Immediate Anesthesia Transfer of Care Note  Patient: Shelby Harrington  Procedure(s) Performed: DILATATION & CURETTAGE/HYSTEROSCOPY WITH MYOSURE (Vagina )  Patient Location: PACU  Anesthesia Type:General  Level of Consciousness: awake and drowsy  Airway & Oxygen Therapy: Patient Spontanous Breathing and Patient connected to face mask oxygen  Post-op Assessment: Report given to RN and Post -op Vital signs reviewed and stable  Post vital signs: Reviewed and stable  Last Vitals:  Vitals Value Taken Time  BP 115/68   Temp    Pulse 55 11/26/21 1258  Resp 13 11/26/21 1258  SpO2 96 % 11/26/21 1258  Vitals shown include unvalidated device data.  Last Pain:  Vitals:   11/26/21 1013  TempSrc:   PainSc: 0-No pain         Complications: No notable events documented.

## 2021-11-26 NOTE — Anesthesia Preprocedure Evaluation (Signed)
Anesthesia Evaluation  Patient identified by MRN, date of birth, ID band Patient awake    Reviewed: Allergy & Precautions, NPO status , Patient's Chart, lab work & pertinent test results, reviewed documented beta blocker date and time   Airway Mallampati: II  TM Distance: >3 FB Neck ROM: Full    Dental no notable dental hx. (+) Teeth Intact, Dental Advisory Given   Pulmonary shortness of breath and with exertion, asthma ,    Pulmonary exam normal breath sounds clear to auscultation       Cardiovascular hypertension, Pt. on medications and Pt. on home beta blockers Normal cardiovascular exam+ dysrhythmias Supra Ventricular Tachycardia  Rhythm:Regular Rate:Normal     Neuro/Psych negative neurological ROS  negative psych ROS   GI/Hepatic Neg liver ROS, GERD  ,  Endo/Other  Hyperlipidemia Obesity  Renal/GU negative Renal ROS  negative genitourinary   Musculoskeletal   Abdominal (+) + obese,   Peds  Hematology  (+) Blood dyscrasia, , Hemochromatosis Being worked up for polycythemia vera Erythrocytosis   Anesthesia Other Findings   Reproductive/Obstetrics                             Anesthesia Physical Anesthesia Plan  ASA: 2  Anesthesia Plan: General   Post-op Pain Management: Tylenol PO (pre-op)*, Precedex, Ketamine IV* and Minimal or no pain anticipated   Induction: Intravenous  PONV Risk Score and Plan: 4 or greater and Treatment may vary due to age or medical condition and Ondansetron  Airway Management Planned: LMA  Additional Equipment: None  Intra-op Plan:   Post-operative Plan: Extubation in OR  Informed Consent: I have reviewed the patients History and Physical, chart, labs and discussed the procedure including the risks, benefits and alternatives for the proposed anesthesia with the patient or authorized representative who has indicated his/her understanding and  acceptance.     Dental advisory given  Plan Discussed with: CRNA and Anesthesiologist  Anesthesia Plan Comments:         Anesthesia Quick Evaluation

## 2021-11-26 NOTE — Anesthesia Postprocedure Evaluation (Signed)
Anesthesia Post Note  Patient: Shelby Harrington  Procedure(s) Performed: DILATATION & CURETTAGE/HYSTEROSCOPY WITH MYOSURE (Vagina )     Anesthesia Post Evaluation No notable events documented.  Last Vitals:  Vitals:   11/26/21 1328 11/26/21 1330  BP: 119/65   Pulse: (!) 48 (!) 47  Resp: 19 18  Temp: 36.6 C   SpO2: 97% 97%    Last Pain:  Vitals:   11/26/21 1328  TempSrc:   PainSc: 4                  [name] A.

## 2021-11-26 NOTE — Interval H&P Note (Signed)
History and Physical Interval Note:  11/26/2021 12:06 PM  Shelby Harrington  has presented today for surgery, with the diagnosis of Postmenopausal Bleeding.  The various methods of treatment have been discussed with the patient and family. After consideration of risks, benefits and other options for treatment, the patient has consented to  Procedure(s) with comments: [place] (N/A) - rep will be here.  Confirmed on 05/15 CS as a surgical intervention.  The patient's history has been reviewed, patient examined, no change in status, stable for surgery.  I have reviewed the patient's chart and labs.  Questions were answered to the patient's satisfaction.     [name]

## 2021-11-27 ENCOUNTER — Encounter (HOSPITAL_COMMUNITY): Payer: Self-pay | Admitting: Obstetrics and Gynecology

## 2021-11-30 LAB — SURGICAL PATHOLOGY

## 2021-12-10 DIAGNOSIS — N840 Polyp of corpus uteri: Secondary | ICD-10-CM | POA: Diagnosis not present

## 2021-12-10 DIAGNOSIS — R32 Unspecified urinary incontinence: Secondary | ICD-10-CM | POA: Diagnosis not present

## 2021-12-14 DIAGNOSIS — I700 Atherosclerosis of aorta: Secondary | ICD-10-CM | POA: Diagnosis not present

## 2021-12-14 DIAGNOSIS — Z79899 Other long term (current) drug therapy: Secondary | ICD-10-CM | POA: Diagnosis not present

## 2021-12-14 DIAGNOSIS — I129 Hypertensive chronic kidney disease with stage 1 through stage 4 chronic kidney disease, or unspecified chronic kidney disease: Secondary | ICD-10-CM | POA: Diagnosis not present

## 2021-12-16 ENCOUNTER — Other Ambulatory Visit: Payer: Self-pay

## 2021-12-16 MED ORDER — METOPROLOL SUCCINATE ER 25 MG PO TB24: 25.0000 mg | ORAL_TABLET | Freq: Two times a day (BID) | ORAL | 2 refills | Status: DC

## 2021-12-16 NOTE — Telephone Encounter (Signed)
Medication list updated.

## 2021-12-16 NOTE — Progress Notes (Signed)
Medication list updated.

## 2022-01-28 ENCOUNTER — Inpatient Hospital Stay: Payer: PPO | Attending: Hematology & Oncology

## 2022-01-28 DIAGNOSIS — Z79899 Other long term (current) drug therapy: Secondary | ICD-10-CM | POA: Diagnosis not present

## 2022-01-28 LAB — CMP (CANCER CENTER ONLY)
ALT: 22 U/L (ref 0–44)
AST: 23 U/L (ref 15–41)
Albumin: 4.2 g/dL (ref 3.5–5.0)
Alkaline Phosphatase: 101 U/L (ref 38–126)
Anion gap: 9 (ref 5–15)
BUN: 21 mg/dL (ref 8–23)
CO2: 25 mmol/L (ref 22–32)
Calcium: 9.4 mg/dL (ref 8.9–10.3)
Chloride: 108 mmol/L (ref 98–111)
Creatinine: 1.27 mg/dL — ABNORMAL HIGH (ref 0.44–1.00)
GFR, Estimated: 46 mL/min — ABNORMAL LOW (ref >60)
Glucose, Bld: 129 mg/dL — ABNORMAL HIGH (ref 70–99)
Potassium: 3.8 mmol/L (ref 3.5–5.1)
Sodium: 142 mmol/L (ref 135–145)
Total Bilirubin: 0.4 mg/dL (ref 0.3–1.2)
Total Protein: 6.4 g/dL — ABNORMAL LOW (ref 6.5–8.1)

## 2022-01-28 LAB — CBC WITH DIFFERENTIAL (CANCER CENTER ONLY)
Abs Immature Granulocytes: 0.02 10*3/uL (ref 0.00–0.07)
Basophils Absolute: 0.1 10*3/uL (ref 0.0–0.1)
Basophils Relative: 1 %
Eosinophils Absolute: 0.4 10*3/uL (ref 0.0–0.5)
Eosinophils Relative: 6 %
HCT: 46 % (ref 36.0–46.0)
Hemoglobin: 15.5 g/dL — ABNORMAL HIGH (ref 12.0–15.0)
Immature Granulocytes: 0 %
Lymphocytes Relative: 32 %
Lymphs Abs: 2.1 10*3/uL (ref 0.7–4.0)
MCH: 30.8 pg (ref 26.0–34.0)
MCHC: 33.7 g/dL (ref 30.0–36.0)
MCV: 91.5 fL (ref 80.0–100.0)
Monocytes Absolute: 0.5 10*3/uL (ref 0.1–1.0)
Monocytes Relative: 8 %
Neutro Abs: 3.7 10*3/uL (ref 1.7–7.7)
Neutrophils Relative %: 53 %
Platelet Count: 204 10*3/uL (ref 150–400)
RBC: 5.03 MIL/uL (ref 3.87–5.11)
RDW: 14.4 % (ref 11.5–15.5)
WBC Count: 6.8 10*3/uL (ref 4.0–10.5)
nRBC: 0 % (ref 0.0–0.2)

## 2022-01-28 LAB — FERRITIN: Ferritin: 21 ng/mL (ref 11–307)

## 2022-01-31 LAB — IRON AND IRON BINDING CAPACITY (CC-WL,HP ONLY)
Iron: 114 ug/dL (ref 28–170)
Saturation Ratios: 28 % (ref 10.4–31.8)
TIBC: 406 ug/dL (ref 250–450)
UIBC: 292 ug/dL (ref 148–442)

## 2022-03-09 DIAGNOSIS — R197 Diarrhea, unspecified: Secondary | ICD-10-CM | POA: Diagnosis not present

## 2022-03-10 DIAGNOSIS — H53001 Unspecified amblyopia, right eye: Secondary | ICD-10-CM | POA: Diagnosis not present

## 2022-03-10 DIAGNOSIS — H02834 Dermatochalasis of left upper eyelid: Secondary | ICD-10-CM | POA: Diagnosis not present

## 2022-03-10 DIAGNOSIS — H2513 Age-related nuclear cataract, bilateral: Secondary | ICD-10-CM | POA: Diagnosis not present

## 2022-03-10 DIAGNOSIS — H02831 Dermatochalasis of right upper eyelid: Secondary | ICD-10-CM | POA: Diagnosis not present

## 2022-03-10 DIAGNOSIS — H40053 Ocular hypertension, bilateral: Secondary | ICD-10-CM | POA: Diagnosis not present

## 2022-03-22 ENCOUNTER — Ambulatory Visit: Payer: PPO | Attending: Cardiology | Admitting: Cardiology

## 2022-03-22 ENCOUNTER — Encounter: Payer: Self-pay | Admitting: Cardiology

## 2022-03-22 VITALS — BP 124/78 | HR 60 | Ht 59.0 in | Wt 163.2 lb

## 2022-03-22 DIAGNOSIS — E669 Obesity, unspecified: Secondary | ICD-10-CM

## 2022-03-22 DIAGNOSIS — I491 Atrial premature depolarization: Secondary | ICD-10-CM | POA: Diagnosis not present

## 2022-03-22 DIAGNOSIS — I471 Supraventricular tachycardia: Secondary | ICD-10-CM

## 2022-03-22 NOTE — Progress Notes (Unsigned)
Cardiology Office Note:    Date:  03/23/2022   ID:  Shelby, Harrington 04/15/1951, MRN [id number]  PCP:  [name], MD  Cardiologist:  Berniece Salines, DO  Electrophysiologist:  None   Referring MD: [name], MD   " I am doing fine"  History of Present Illness:    Shelby Harrington is a 71 y.o. female with a hx of hypertension, paroxysmal SVT seen on her recent monitor and rare symptomatic premature atrial complexes.   I saw the patient on September 14, 2020 at that time she experiencing chest discomfort, I send her for a CCTA which did not show any evidence of coronary artery disease.  However her ZIO monitor that showed paroxysmal SVT as well as rare symptomatic PACs.  I saw the patient virtually on 10/14/2021 at that time she at that time we discussed her monitor results.  In the meantime she called me reported she had been experiencing some palpitations a week started the patient on metoprolol and has optimized to the 25 mg daily.  She is doing well.    Past Medical History:  Diagnosis Date   Family history of malignant neoplasm of breast    Family history of uterine cancer    Hemochromatosis associated with mutation in HFE gene ([place]) 01/25/2016   Hypertension     Past Surgical History:  Procedure Laterality Date   BREAST EXCISIONAL BIOPSY Right 1983   Benign   DILATATION & CURETTAGE/HYSTEROSCOPY WITH MYOSURE N/A 11/26/2021   Procedure: DILATATION & CURETTAGE/HYSTEROSCOPY WITH MYOSURE;  Surgeon: [name], DO;  Location: [facility];  Service: Gynecology;  Laterality: N/A;  rep will be here.  Confirmed on 05/15 CS    Current Medications: Current Meds  Medication Sig   acetaminophen (TYLENOL) 500 MG tablet Take 500 mg by mouth every 6 (six) hours as needed for moderate pain.   ALPRAZolam (XANAX) 0.25 MG tablet Take 0.25 mg by mouth 2 (two) times daily as needed for anxiety.   amLODipine (NORVASC) 5 MG tablet Take 5 mg by mouth daily.   Ascorbic Acid (VITAMIN C) 1000  MG tablet Take 1,000 mg by mouth daily.   aspirin 81 MG tablet Take 81 mg by mouth daily.   atorvastatin (LIPITOR) 10 MG tablet Take 10 mg by mouth at bedtime.   azithromycin (ZITHROMAX) 250 MG tablet Take by mouth. Prior to dental procedure Oct 2nd   betamethasone valerate (VALISONE) 0.1 % cream Apply 1 application. topically 2 (two) times daily as needed (rash).   CALCIUM PO Take 1,200 mg by mouth daily.   Cetirizine HCl (ZYRTEC ALLERGY) 10 MG CAPS One at bedtime   Cholecalciferol (DIALYVITE VITAMIN D 5000) 125 MCG (5000 UT) capsule Take 5,000 Units by mouth daily.   famotidine (PEPCID) 20 MG tablet Take 20 mg by mouth daily as needed for heartburn or indigestion.   fluticasone (FLONASE) 50 MCG/ACT nasal spray Place 2 sprays into both nostrils daily.   Glucosamine-Chondroitin (GLUCOSAMINE CHONDR COMPLEX PO) Take 1 capsule by mouth daily as needed (rash).   losartan (COZAAR) 25 MG tablet Take 12.5 mg by mouth daily.   Magnesium 250 MG TABS Take 250 mg by mouth daily.   metoprolol succinate (TOPROL-XL) 25 MG 24 hr tablet Take 1 tablet (25 mg total) by mouth in the morning and at bedtime. Take 25 mg (one tablet) in the morning and 12.5 mg (half tablet) in the evening.   Multiple Vitamins-Minerals (CENTRUM SILVER PO) Take 1 tablet by mouth daily.  traZODone (DESYREL) 50 MG tablet Take 25-50 mg by mouth at bedtime.     Allergies:   Prednisone, Latex, Clindamycin/lincomycin, Codeine, Penicillins, and Tape   Social History   Socioeconomic History   Marital status: Married    Spouse name: Not on file   Number of children: Not on file   Years of education: Not on file   Highest education level: Not on file  Occupational History   Not on file  Tobacco Use   Smoking status: Never   Smokeless tobacco: Never  Vaping Use   Vaping Use: Never used  Substance and Sexual Activity   Alcohol use: Yes    Alcohol/week: 0.0 standard drinks of alcohol    Comment: glass of wine on the weekends    Drug use: No   Sexual activity: Not on file  Other Topics Concern   Not on file  Social History Narrative   Not on file   Social Determinants of Health   Financial Resource Strain: Not on file  Food Insecurity: Not on file  Transportation Needs: Not on file  Physical Activity: Not on file  Stress: Not on file  Social Connections: Not on file     Family History: The patient's family history includes Breast cancer (age of onset: 39) in her mother; Diabetes in her father; Emphysema in her mother; Hypertension in her father and mother; Skin cancer in her father; Uterine cancer (age of onset: 53) in her sister.  ROS:   Review of Systems  Constitution: Negative for decreased appetite, fever and weight gain.  HENT: Negative for congestion, ear discharge, hoarse voice and sore throat.   Eyes: Negative for discharge, redness, vision loss in right eye and visual halos.  Cardiovascular: Negative for chest pain, dyspnea on exertion, leg swelling, orthopnea and palpitations.  Respiratory: Negative for cough, hemoptysis, shortness of breath and snoring.   Endocrine: Negative for heat intolerance and polyphagia.  Hematologic/Lymphatic: Negative for bleeding problem. Does not bruise/bleed easily.  Skin: Negative for flushing, nail changes, rash and suspicious lesions.  Musculoskeletal: Negative for arthritis, joint pain, muscle cramps, myalgias, neck pain and stiffness.  Gastrointestinal: Negative for abdominal pain, bowel incontinence, diarrhea and excessive appetite.  Genitourinary: Negative for decreased libido, genital sores and incomplete emptying.  Neurological: Negative for brief paralysis, focal weakness, headaches and loss of balance.  Psychiatric/Behavioral: Negative for altered mental status, depression and suicidal ideas.  Allergic/Immunologic: Negative for HIV exposure and persistent infections.    EKGs/Labs/Other Studies Reviewed:    The following studies were reviewed  today:   EKG:  None today   Zio monitor 10/06/2021 Study Highlights     Patch Wear Time:  11 days and 23 hours (2023-03-17T07:39:12-398 to 2023-03-29T06:52:24-0400)   Patient had a min HR of 43 bpm, max HR of 197 bpm, and avg HR of 62 bpm. Predominant underlying rhythm was Sinus Rhythm. 16 Supraventricular Tachycardia runs occurred, the run with the fastest interval lasting 7 beats with a max rate of 197 bpm, the  longest lasting 20 beats with an avg rate of 125 bpm. Some episodes of Supraventricular Tachycardia may be possible Atrial Tachycardia with variable block. Isolated SVEs were rare (<1.0%), SVE Couplets were rare (<1.0%), and SVE Triplets were rare  (<1.0%). Isolated VEs were rare (<1.0%), VE Couplets were rare (<1.0%), and no VE Triplets were present.     Symptoms associated with sinus tachycardia and rare premature atrial complexes.   Conclusion: The study is remarkable for the following:  1. Paroxysmal supraventricular tachycardia             2. Rare symptomatic premature atrial complexes   CCTA 09/29/2021 FINDINGS: Image quality: Excellent. Noise artifact is: Limited. Coronary Arteries:  Normal coronary origin.  Right dominance. Left main: The left main is a large caliber vessel with a normal take off from the left coronary cusp that bifurcates to form a left anterior descending artery and a left circumflex artery. There is no plaque or stenosis. Left anterior descending artery: The LAD is patent without evidence of plaque or stenosis. The LAD gives off 1 patent diagonal branch. Left circumflex artery: The LCX is non-dominant and patent with no evidence of plaque or stenosis. The LCX gives off 1 patent obtuse marginal branch.  Right coronary artery: The RCA is dominant with normal take off from the right coronary cusp. There is no evidence of plaque or stenosis. The RCA terminates as a PDA and right posterolateral branch without evidence of plaque or  stenosis.   Right Atrium: Right atrial size is within normal limits.   Right Ventricle: The right ventricular cavity is within normal limits.   Left Atrium: Left atrial size is normal in size with no left atrial appendage filling defect.   Left Ventricle: The ventricular cavity size is within normal limits. There are no stigmata of prior infarction. There is no abnormal filling defect.   Pulmonary arteries: Normal in size without proximal filling defect.   Pulmonary veins: Normal pulmonary venous drainage.   Pericardium: Normal thickness with no significant effusion or calcium present.   Cardiac valves: The aortic valve is trileaflet without significant calcification. The mitral valve is normal structure without significant calcification.   Aorta: Normal caliber with aortic atherosclerosis.   Extra-cardiac findings: See attached radiology report for non-cardiac structures.   IMPRESSION: 1. Coronary calcium score of 0. This was 0 percentile for age-, sex, and race-matched controls.   2. Normal coronary origin with right dominance.   3. No evidence of CAD.   4. Aortic atherosclerosis.   RECOMMENDATIONS: CAD-RADS 0: No evidence of CAD (0%). Consider non-atherosclerotic causes of chest pain.  Kirk Ruths, MD  Electronically Signed   By: Kirk Ruths M.D.   On: 09/29/2021 12:06   Recent Labs: 09/14/2021: Magnesium 2.3 01/28/2022: ALT 22; BUN 21; Creatinine 1.27; Hemoglobin 15.5; Platelet Count 204; Potassium 3.8; Sodium 142  Recent Lipid Panel No results found for: "CHOL", "TRIG", "HDL", "CHOLHDL", "VLDL", "LDLCALC", "LDLDIRECT"  Physical Exam:    VS:  BP 124/78   Pulse 60   Ht '4\' 11"'$  (1.499 m)   Wt 163 lb 3.2 oz (74 kg)   SpO2 98%   BMI 32.96 kg/m     Wt Readings from Last 3 Encounters:  03/22/22 163 lb 3.2 oz (74 kg)  11/26/21 154 lb (69.9 kg)  10/29/21 153 lb (69.4 kg)     GEN: Well nourished, well developed in no acute distress HEENT:  Normal NECK: No JVD; No carotid bruits LYMPHATICS: No lymphadenopathy CARDIAC: S1S2 noted,RRR, no murmurs, rubs, gallops RESPIRATORY:  Clear to auscultation without rales, wheezing or rhonchi  ABDOMEN: Soft, non-tender, non-distended, +bowel sounds, no guarding. EXTREMITIES: No edema, No cyanosis, no clubbing MUSCULOSKELETAL:  No deformity  SKIN: Warm and dry NEUROLOGIC:  Alert and oriented x 3, non-focal PSYCHIATRIC:  Normal affect, good insight  ASSESSMENT:    1. PSVT (paroxysmal supraventricular tachycardia) ([place])   2. PAC (premature atrial contraction)   3. Obesity (BMI 30-39.9)    PLAN:  1.She is doing well from a CV standpoint no need for any medication changes at this time.  We will continue the patient on her metoprolol dosing.  2.  The patient understands the need to lose weight with diet and exercise. We have discussed specific strategies for this.   The patient is in agreement with the above plan. The patient left the office in stable condition.  The patient will follow up in 9 months   Medication Adjustments/Labs and Tests Ordered: Current medicines are reviewed at length with the patient today.  Concerns regarding medicines are outlined above.  No orders of the defined types were placed in this encounter.  No orders of the defined types were placed in this encounter.   Patient Instructions  Medication Instructions:  Your physician recommends that you continue on your current medications as directed. Please refer to the Current Medication list given to you today.  *If you need a refill on your cardiac medications before your next appointment, please call your pharmacy*   Lab Work: None   Testing/Procedures: None   Follow-Up: At [facility], you and your health needs are our priority.  As part of our continuing mission to provide you with exceptional heart care, we have created designated Provider Care Teams.  These Care Teams include your  primary Cardiologist (physician) and Advanced Practice Providers (APPs -  Physician Assistants and Nurse Practitioners) who all work together to provide you with the care you need, when you need it.  We recommend signing up for the patient portal called "MyChart".  Sign up information is provided on this After Visit Summary.  MyChart is used to connect with patients for Virtual Visits (Telemedicine).  Patients are able to view lab/test results, encounter notes, upcoming appointments, etc.  Non-urgent messages can be sent to your provider as well.   To learn more about what you can do with MyChart, go to [email or web address].    Your next appointment:   9 month(s)  The format for your next appointment:   In Person  Provider:   Berniece Salines, DO     Other Instructions   Important Information About Sugar         Adopting a Healthy Lifestyle.  Know what a healthy weight is for you (roughly BMI <25) and aim to maintain this   Aim for 7+ servings of fruits and vegetables daily   65-80+ fluid ounces of water or unsweet tea for healthy kidneys   Limit to max 1 drink of alcohol per day; avoid smoking/tobacco   Limit animal fats in diet for cholesterol and heart health - choose grass fed whenever available   Avoid highly processed foods, and foods high in saturated/trans fats   Aim for low stress - take time to unwind and care for your mental health   Aim for 150 min of moderate intensity exercise weekly for heart health, and weights twice weekly for bone health   Aim for 7-9 hours of sleep daily   When it comes to diets, agreement about the perfect plan isnt easy to find, even among the experts. Experts at the [employer] developed an idea known as the Healthy Eating Plate. Just imagine a plate divided into logical, healthy portions.   The emphasis is on diet quality:   Load up on vegetables and fruits - one-half of your plate: Aim for color and variety,  and remember that potatoes dont count.   Go for whole grains - one-quarter  of your plate: Whole wheat, barley, wheat berries, quinoa, oats, brown rice, and foods made with them. If you want pasta, go with whole wheat pasta.   Protein power - one-quarter of your plate: Fish, chicken, beans, and nuts are all healthy, versatile protein sources. Limit red meat.   The diet, however, does go beyond the plate, offering a few other suggestions.   Use healthy plant oils, such as olive, canola, soy, corn, sunflower and peanut. Check the labels, and avoid partially hydrogenated oil, which have unhealthy trans fats.   If youre thirsty, drink water. Coffee and tea are good in moderation, but skip sugary drinks and limit milk and dairy products to one or two daily servings.   The type of carbohydrate in the diet is more important than the amount. Some sources of carbohydrates, such as vegetables, fruits, whole grains, and beans-are healthier than others.   Finally, stay active  Signed, Berniece Salines, DO  03/23/2022 7:57 AM    [place]

## 2022-03-22 NOTE — Patient Instructions (Signed)
Medication Instructions:  Your physician recommends that you continue on your current medications as directed. Please refer to the Current Medication list given to you today.  *If you need a refill on your cardiac medications before your next appointment, please call your pharmacy*   Lab Work: None   Testing/Procedures: None   Follow-Up: At [facility], you and your health needs are our priority.  As part of our continuing mission to provide you with exceptional heart care, we have created designated Provider Care Teams.  These Care Teams include your primary Cardiologist (physician) and Advanced Practice Providers (APPs -  Physician Assistants and Nurse Practitioners) who all work together to provide you with the care you need, when you need it.  We recommend signing up for the patient portal called "MyChart".  Sign up information is provided on this After Visit Summary.  MyChart is used to connect with patients for Virtual Visits (Telemedicine).  Patients are able to view lab/test results, encounter notes, upcoming appointments, etc.  Non-urgent messages can be sent to your provider as well.   To learn more about what you can do with MyChart, go to [email or web address].    Your next appointment:   9 month(s)  The format for your next appointment:   In Person  Provider:   Berniece Salines, DO     Other Instructions   Important Information About Sugar

## 2022-03-23 DIAGNOSIS — E669 Obesity, unspecified: Secondary | ICD-10-CM | POA: Insufficient documentation

## 2022-04-22 DIAGNOSIS — I129 Hypertensive chronic kidney disease with stage 1 through stage 4 chronic kidney disease, or unspecified chronic kidney disease: Secondary | ICD-10-CM | POA: Diagnosis not present

## 2022-04-22 DIAGNOSIS — F439 Reaction to severe stress, unspecified: Secondary | ICD-10-CM | POA: Diagnosis not present

## 2022-04-22 DIAGNOSIS — I700 Atherosclerosis of aorta: Secondary | ICD-10-CM | POA: Diagnosis not present

## 2022-04-22 DIAGNOSIS — N183 Chronic kidney disease, stage 3 unspecified: Secondary | ICD-10-CM | POA: Diagnosis not present

## 2022-04-22 DIAGNOSIS — Z23 Encounter for immunization: Secondary | ICD-10-CM | POA: Diagnosis not present

## 2022-04-22 DIAGNOSIS — F5101 Primary insomnia: Secondary | ICD-10-CM | POA: Diagnosis not present

## 2022-04-29 ENCOUNTER — Inpatient Hospital Stay (HOSPITAL_BASED_OUTPATIENT_CLINIC_OR_DEPARTMENT_OTHER): Payer: PPO | Admitting: Family

## 2022-04-29 ENCOUNTER — Inpatient Hospital Stay: Payer: PPO | Attending: Hematology & Oncology

## 2022-04-29 DIAGNOSIS — Z880 Allergy status to penicillin: Secondary | ICD-10-CM | POA: Insufficient documentation

## 2022-04-29 DIAGNOSIS — Z881 Allergy status to other antibiotic agents status: Secondary | ICD-10-CM | POA: Insufficient documentation

## 2022-04-29 DIAGNOSIS — Z7982 Long term (current) use of aspirin: Secondary | ICD-10-CM | POA: Diagnosis not present

## 2022-04-29 DIAGNOSIS — Z79899 Other long term (current) drug therapy: Secondary | ICD-10-CM | POA: Insufficient documentation

## 2022-04-29 DIAGNOSIS — Z885 Allergy status to narcotic agent status: Secondary | ICD-10-CM | POA: Diagnosis not present

## 2022-04-29 DIAGNOSIS — Z888 Allergy status to other drugs, medicaments and biological substances status: Secondary | ICD-10-CM | POA: Insufficient documentation

## 2022-04-29 LAB — CBC WITH DIFFERENTIAL (CANCER CENTER ONLY)
Abs Immature Granulocytes: 0.03 10*3/uL (ref 0.00–0.07)
Basophils Absolute: 0.1 10*3/uL (ref 0.0–0.1)
Basophils Relative: 1 %
Eosinophils Absolute: 0.2 10*3/uL (ref 0.0–0.5)
Eosinophils Relative: 3 %
HCT: 48 % — ABNORMAL HIGH (ref 36.0–46.0)
Hemoglobin: 16 g/dL — ABNORMAL HIGH (ref 12.0–15.0)
Immature Granulocytes: 1 %
Lymphocytes Relative: 39 %
Lymphs Abs: 2.6 10*3/uL (ref 0.7–4.0)
MCH: 31.1 pg (ref 26.0–34.0)
MCHC: 33.3 g/dL (ref 30.0–36.0)
MCV: 93.4 fL (ref 80.0–100.0)
Monocytes Absolute: 0.6 10*3/uL (ref 0.1–1.0)
Monocytes Relative: 9 %
Neutro Abs: 3.1 10*3/uL (ref 1.7–7.7)
Neutrophils Relative %: 47 %
Platelet Count: 199 10*3/uL (ref 150–400)
RBC: 5.14 MIL/uL — ABNORMAL HIGH (ref 3.87–5.11)
RDW: 12.6 % (ref 11.5–15.5)
WBC Count: 6.5 10*3/uL (ref 4.0–10.5)
nRBC: 0 % (ref 0.0–0.2)

## 2022-04-29 LAB — FERRITIN: Ferritin: 30 ng/mL (ref 11–307)

## 2022-04-29 LAB — CMP (CANCER CENTER ONLY)
ALT: 27 U/L (ref 0–44)
AST: 28 U/L (ref 15–41)
Albumin: 4.3 g/dL (ref 3.5–5.0)
Alkaline Phosphatase: 97 U/L (ref 38–126)
Anion gap: 8 (ref 5–15)
BUN: 15 mg/dL (ref 8–23)
CO2: 28 mmol/L (ref 22–32)
Calcium: 9.9 mg/dL (ref 8.9–10.3)
Chloride: 108 mmol/L (ref 98–111)
Creatinine: 1.26 mg/dL — ABNORMAL HIGH (ref 0.44–1.00)
GFR, Estimated: 46 mL/min — ABNORMAL LOW (ref >60)
Glucose, Bld: 97 mg/dL (ref 70–99)
Potassium: 4.7 mmol/L (ref 3.5–5.1)
Sodium: 144 mmol/L (ref 135–145)
Total Bilirubin: 0.5 mg/dL (ref 0.3–1.2)
Total Protein: 6.5 g/dL (ref 6.5–8.1)

## 2022-04-29 LAB — IRON AND IRON BINDING CAPACITY (CC-WL,HP ONLY)
Iron: 108 ug/dL (ref 28–170)
Saturation Ratios: 26 % (ref 10.4–31.8)
TIBC: 412 ug/dL (ref 250–450)
UIBC: 304 ug/dL (ref 148–442)

## 2022-04-29 NOTE — Progress Notes (Signed)
Hematology and Oncology Follow Up Visit  Shelby Harrington [id number] 1951/03/08 71 y.o. 04/29/2022   Principle Diagnosis:  Hemochromatosis - Heterozygous H63D mutation   Current Therapy:        Aspirin 81 mg PO daily Phlebotomy to keep ferritin < 50 and iron sat < 30%    Interim History:  Shelby Harrington is here today for follow-up. She is doing well and has no complaints at this time.  No blood loss, bruising or petechiae.  No fever, chills, n/v, cough, rash, dizziness, SOB, chest pain, palpitations, abdominal pain or changes in bowel or bladder habits at this time.  No swelling, tenderness, numbness or tingling in her extremities.  No falls or syncope reported.  Appetite and hydration are good. Weight is stable at 164 lbs.   ECOG Performance Status: 0 - Asymptomatic  Medications:  Allergies as of 04/29/2022       Reactions   Prednisone Palpitations   Latex Other (See Comments)   Skin blisters   Clindamycin/lincomycin Diarrhea   Codeine Rash   Penicillins Rash   Tape Rash   Adhesive tape        Medication List        Accurate as of April 29, 2022  1:28 PM. If you have any questions, ask your nurse or doctor.          STOP taking these medications    azithromycin 250 MG tablet Commonly known as: ZITHROMAX Stopped by: Lottie Dawson, NP       TAKE these medications    acetaminophen 500 MG tablet Commonly known as: TYLENOL Take 500 mg by mouth every 6 (six) hours as needed for moderate pain.   ALPRAZolam 0.25 MG tablet Commonly known as: XANAX Take 0.25 mg by mouth 2 (two) times daily as needed for anxiety.   amLODipine 5 MG tablet Commonly known as: NORVASC Take 5 mg by mouth daily.   aspirin 81 MG tablet Take 81 mg by mouth daily.   atorvastatin 10 MG tablet Commonly known as: LIPITOR Take 10 mg by mouth at bedtime.   betamethasone valerate 0.1 % cream Commonly known as: VALISONE Apply 1 application. topically 2 (two) times daily as  needed (rash).   CALCIUM PO Take 1,200 mg by mouth daily.   CENTRUM SILVER PO Take 1 tablet by mouth daily.   Cetirizine HCl 10 MG Caps Commonly known as: ZyrTEC Allergy One at bedtime   Dialyvite Vitamin D 5000 125 MCG (5000 UT) capsule Generic drug: Cholecalciferol Take 5,000 Units by mouth daily.   famotidine 20 MG tablet Commonly known as: PEPCID Take 20 mg by mouth daily as needed for heartburn or indigestion.   fluticasone 50 MCG/ACT nasal spray Commonly known as: FLONASE Place 2 sprays into both nostrils daily.   GLUCOSAMINE CHONDR COMPLEX PO Take 1 capsule by mouth daily as needed (rash).   losartan 25 MG tablet Commonly known as: COZAAR Take 12.5 mg by mouth daily.   Magnesium 250 MG Tabs Take 250 mg by mouth daily.   metoprolol succinate 25 MG 24 hr tablet Commonly known as: TOPROL-XL Take 1 tablet (25 mg total) by mouth in the morning and at bedtime. Take 25 mg (one tablet) in the morning and 12.5 mg (half tablet) in the evening.   traZODone 50 MG tablet Commonly known as: DESYREL Take 25-50 mg by mouth at bedtime.   vitamin C 1000 MG tablet Take 1,000 mg by mouth daily.        Allergies:  Allergies  Allergen Reactions   Prednisone Palpitations   Latex Other (See Comments)    Skin blisters   Clindamycin/Lincomycin Diarrhea   Codeine Rash   Penicillins Rash   Tape Rash    Adhesive tape    Past Medical History, Surgical history, Social history, and Family History were reviewed and updated.  Review of Systems: All other 10 point review of systems is negative.   Physical Exam:  weight is 164 lb 1.9 oz (74.4 kg). Her blood pressure is 136/77 and her pulse is 50 (abnormal). Her respiration is 17 and oxygen saturation is 98%.   Wt Readings from Last 3 Encounters:  04/29/22 164 lb 1.9 oz (74.4 kg)  03/22/22 163 lb 3.2 oz (74 kg)  11/26/21 154 lb (69.9 kg)    Ocular: Sclerae unicteric, pupils equal, round and reactive to  light Ear-nose-throat: Oropharynx clear, dentition fair Lymphatic: No cervical or supraclavicular adenopathy Lungs no rales or rhonchi, good excursion bilaterally Heart regular rate and rhythm, no murmur appreciated Abd soft, nontender, positive bowel sounds MSK no focal spinal tenderness, no joint edema Neuro: non-focal, well-oriented, appropriate affect Breasts: Deferred   Lab Results  Component Value Date   WBC 6.5 04/29/2022   HGB 16.0 (H) 04/29/2022   HCT 48.0 (H) 04/29/2022   MCV 93.4 04/29/2022   PLT 199 04/29/2022   Lab Results  Component Value Date   FERRITIN 21 01/28/2022   IRON 114 01/28/2022   TIBC 406 01/28/2022   UIBC 292 01/28/2022   IRONPCTSAT 28 01/28/2022   Lab Results  Component Value Date   RBC 5.14 (H) 04/29/2022   No results found for: "KPAFRELGTCHN", "LAMBDASER", "KAPLAMBRATIO" No results found for: "IGGSERUM", "IGA", "IGMSERUM" No results found for: "TOTALPROTELP", "ALBUMINELP", "A1GS", "A2GS", "BETS", "BETA2SER", "GAMS", "MSPIKE", "SPEI"   Chemistry      Component Value Date/Time   NA 142 01/28/2022 1457   NA 142 09/14/2021 0921   NA 148 (H) 06/29/2017 0813   NA 143 03/30/2017 0805   K 3.8 01/28/2022 1457   K 3.8 06/29/2017 0813   K 4.0 03/30/2017 0805   CL 108 01/28/2022 1457   CL 105 06/29/2017 0813   CO2 25 01/28/2022 1457   CO2 28 06/29/2017 0813   CO2 26 03/30/2017 0805   BUN 21 01/28/2022 1457   BUN 16 09/14/2021 0921   BUN 21 06/29/2017 0813   BUN 18.2 03/30/2017 0805   CREATININE 1.27 (H) 01/28/2022 1457   CREATININE 1.3 (H) 06/29/2017 0813   CREATININE 1.0 03/30/2017 0805      Component Value Date/Time   CALCIUM 9.4 01/28/2022 1457   CALCIUM 9.5 06/29/2017 0813   CALCIUM 9.6 03/30/2017 0805   ALKPHOS 101 01/28/2022 1457   ALKPHOS 78 06/29/2017 0813   ALKPHOS 73 03/30/2017 0805   AST 23 01/28/2022 1457   AST 29 03/30/2017 0805   ALT 22 01/28/2022 1457   ALT 32 06/29/2017 0813   ALT 26 03/30/2017 0805   BILITOT 0.4  01/28/2022 1457   BILITOT 0.67 03/30/2017 0805       Impression and Plan: Shelby Harrington is a very pleasant 71 yo caucasian female with hemochromatosis, heterozygous for the H63D mutation.  Iron studies are pending. We will set her up for phlebotomy if needed.  Lab check in 3 months, follow-up in 6 months.    Lottie Dawson, NP 10/27/20231:28 PM

## 2022-05-02 ENCOUNTER — Other Ambulatory Visit: Payer: Self-pay | Admitting: Family Medicine

## 2022-05-02 DIAGNOSIS — Z1231 Encounter for screening mammogram for malignant neoplasm of breast: Secondary | ICD-10-CM

## 2022-05-11 DIAGNOSIS — N3944 Nocturnal enuresis: Secondary | ICD-10-CM | POA: Diagnosis not present

## 2022-05-11 DIAGNOSIS — R350 Frequency of micturition: Secondary | ICD-10-CM | POA: Diagnosis not present

## 2022-05-11 DIAGNOSIS — N3946 Mixed incontinence: Secondary | ICD-10-CM | POA: Diagnosis not present

## 2022-05-11 DIAGNOSIS — R351 Nocturia: Secondary | ICD-10-CM | POA: Diagnosis not present

## 2022-05-23 DIAGNOSIS — Z09 Encounter for follow-up examination after completed treatment for conditions other than malignant neoplasm: Secondary | ICD-10-CM | POA: Diagnosis not present

## 2022-05-23 DIAGNOSIS — Z86010 Personal history of colonic polyps: Secondary | ICD-10-CM | POA: Diagnosis not present

## 2022-05-23 DIAGNOSIS — D123 Benign neoplasm of transverse colon: Secondary | ICD-10-CM | POA: Diagnosis not present

## 2022-05-23 DIAGNOSIS — K649 Unspecified hemorrhoids: Secondary | ICD-10-CM | POA: Diagnosis not present

## 2022-05-30 DIAGNOSIS — D123 Benign neoplasm of transverse colon: Secondary | ICD-10-CM | POA: Diagnosis not present

## 2022-06-14 DIAGNOSIS — N3946 Mixed incontinence: Secondary | ICD-10-CM | POA: Diagnosis not present

## 2022-06-30 ENCOUNTER — Ambulatory Visit
Admission: RE | Admit: 2022-06-30 | Discharge: 2022-06-30 | Disposition: A | Discharge status: Home / Self Care | Payer: PPO | Source: Ambulatory Visit | Attending: Family Medicine | Admitting: Family Medicine

## 2022-06-30 DIAGNOSIS — Z1231 Encounter for screening mammogram for malignant neoplasm of breast: Secondary | ICD-10-CM

## 2022-07-08 DIAGNOSIS — R350 Frequency of micturition: Secondary | ICD-10-CM | POA: Diagnosis not present

## 2022-07-08 DIAGNOSIS — N3946 Mixed incontinence: Secondary | ICD-10-CM | POA: Diagnosis not present

## 2022-07-08 DIAGNOSIS — N3942 Incontinence without sensory awareness: Secondary | ICD-10-CM | POA: Diagnosis not present

## 2022-07-29 ENCOUNTER — Inpatient Hospital Stay: Payer: PPO | Attending: Hematology & Oncology

## 2022-07-29 LAB — IRON AND IRON BINDING CAPACITY (CC-WL,HP ONLY)
Iron: 129 ug/dL (ref 28–170)
Saturation Ratios: 33 % — ABNORMAL HIGH (ref 10.4–31.8)
TIBC: 391 ug/dL (ref 250–450)
UIBC: 262 ug/dL (ref 148–442)

## 2022-07-29 LAB — CMP (CANCER CENTER ONLY)
ALT: 18 U/L (ref 0–44)
AST: 22 U/L (ref 15–41)
Albumin: 4.3 g/dL (ref 3.5–5.0)
Alkaline Phosphatase: 85 U/L (ref 38–126)
Anion gap: 7 (ref 5–15)
BUN: 17 mg/dL (ref 8–23)
CO2: 25 mmol/L (ref 22–32)
Calcium: 9.5 mg/dL (ref 8.9–10.3)
Chloride: 107 mmol/L (ref 98–111)
Creatinine: 1.12 mg/dL — ABNORMAL HIGH (ref 0.44–1.00)
GFR, Estimated: 53 mL/min — ABNORMAL LOW (ref >60)
Glucose, Bld: 102 mg/dL — ABNORMAL HIGH (ref 70–99)
Potassium: 4.3 mmol/L (ref 3.5–5.1)
Sodium: 139 mmol/L (ref 135–145)
Total Bilirubin: 0.6 mg/dL (ref 0.3–1.2)
Total Protein: 6.5 g/dL (ref 6.5–8.1)

## 2022-07-29 LAB — CBC WITH DIFFERENTIAL (CANCER CENTER ONLY)
Abs Immature Granulocytes: 0 10*3/uL (ref 0.00–0.07)
Basophils Absolute: 0 10*3/uL (ref 0.0–0.1)
Basophils Relative: 1 %
Eosinophils Absolute: 0.2 10*3/uL (ref 0.0–0.5)
Eosinophils Relative: 4 %
HCT: 47.5 % — ABNORMAL HIGH (ref 36.0–46.0)
Hemoglobin: 16.1 g/dL — ABNORMAL HIGH (ref 12.0–15.0)
Immature Granulocytes: 0 %
Lymphocytes Relative: 35 %
Lymphs Abs: 1.7 10*3/uL (ref 0.7–4.0)
MCH: 31.4 pg (ref 26.0–34.0)
MCHC: 33.9 g/dL (ref 30.0–36.0)
MCV: 92.6 fL (ref 80.0–100.0)
Monocytes Absolute: 0.6 10*3/uL (ref 0.1–1.0)
Monocytes Relative: 12 %
Neutro Abs: 2.3 10*3/uL (ref 1.7–7.7)
Neutrophils Relative %: 48 %
Platelet Count: 176 10*3/uL (ref 150–400)
RBC: 5.13 MIL/uL — ABNORMAL HIGH (ref 3.87–5.11)
RDW: 12.5 % (ref 11.5–15.5)
WBC Count: 4.7 10*3/uL (ref 4.0–10.5)
nRBC: 0 % (ref 0.0–0.2)

## 2022-07-29 LAB — FERRITIN: Ferritin: 25 ng/mL (ref 11–307)

## 2022-08-08 DIAGNOSIS — Z9189 Other specified personal risk factors, not elsewhere classified: Secondary | ICD-10-CM | POA: Diagnosis not present

## 2022-08-08 DIAGNOSIS — N941 Unspecified dyspareunia: Secondary | ICD-10-CM | POA: Diagnosis not present

## 2022-08-08 DIAGNOSIS — N952 Postmenopausal atrophic vaginitis: Secondary | ICD-10-CM | POA: Diagnosis not present

## 2022-08-08 DIAGNOSIS — Z803 Family history of malignant neoplasm of breast: Secondary | ICD-10-CM | POA: Diagnosis not present

## 2022-08-08 DIAGNOSIS — M8580 Other specified disorders of bone density and structure, unspecified site: Secondary | ICD-10-CM | POA: Diagnosis not present

## 2022-08-10 ENCOUNTER — Other Ambulatory Visit: Payer: Self-pay | Admitting: Obstetrics and Gynecology

## 2022-08-10 DIAGNOSIS — M8580 Other specified disorders of bone density and structure, unspecified site: Secondary | ICD-10-CM

## 2022-08-17 DIAGNOSIS — R350 Frequency of micturition: Secondary | ICD-10-CM | POA: Diagnosis not present

## 2022-08-17 DIAGNOSIS — N3946 Mixed incontinence: Secondary | ICD-10-CM | POA: Diagnosis not present

## 2022-09-23 DIAGNOSIS — N3942 Incontinence without sensory awareness: Secondary | ICD-10-CM | POA: Diagnosis not present

## 2022-10-05 ENCOUNTER — Other Ambulatory Visit: Payer: Self-pay | Admitting: Urology

## 2022-10-05 DIAGNOSIS — R350 Frequency of micturition: Secondary | ICD-10-CM | POA: Diagnosis not present

## 2022-10-05 DIAGNOSIS — N3946 Mixed incontinence: Secondary | ICD-10-CM | POA: Diagnosis not present

## 2022-10-17 ENCOUNTER — Encounter (HOSPITAL_BASED_OUTPATIENT_CLINIC_OR_DEPARTMENT_OTHER): Payer: Self-pay | Admitting: Urology

## 2022-10-17 NOTE — Progress Notes (Addendum)
10/17/2022 2:11 PM Spoke w/ via phone for pre-op interview---Patient Lab needs dos----I Stat Chem 8, EKG, IV NS (pt. Reports stage 3 CKD) Lab results------In Epic COVID test -----patient states asymptomatic no test needed Arrive at -------0615 NPO after MN NO Solid Food.  Clear liquids from MN until---0415 Med rec completed Medications to take morning of surgery -----Metoprolol and Alprazolam if needed. HOLD Losartan DOS. HOLD ASA x 5 days prior DOS per Dr. Sherron Monday. Diabetic medication -----none Patient instructed no nail polish to be worn day of surgery Patient instructed to bring photo id and insurance card day of surgery Patient aware to have Driver (ride ) / caregiver    for 24 hours after surgery- Husband Shelby Harrington Patient Special Instructions -----Requested that patient contact cardiology Dr. Thomasene Ripple and ask for cardiac clearance as Holter monitor in Sept. 2023 still showed intermittent PAC and runs of PSVT. Per LOV no changes made, follow up in 9 mo. Pt. Verbalized understanding of request.  Pre-Op special Istructions -----na Patient verbalized understanding of instructions that were given at this phone interview. Patient denies shortness of breath, chest pain, fever, cough at this phone interview. Shelby Harrington, [name]  2:27 PM Reviewed chart with Dr. Okey Dupre with anesthesia. Advised no need for cardiac clearance. Pt. Contacted via phone and updated on this information as well.  Shelby Harrington, [name]

## 2022-10-27 NOTE — H&P (Signed)
I was consulted to assess the patient's urinary incontinence. She leaks with coughing sneezing sometimes bending lifting. She has urge incontinence and both are significant. She has mild bedwetting. She wears 2 pads a day moderately wet   She voids every 2-3 hours and sometimes gets up once at night. Flow is reasonable and she feels empty   No hysterectomy. She has done physical therapy in the past   On pelvic examination she was a bit nervous. Grade 1 hypermobility bladder neck and no prolapse or stress incontinence. Her tissues looked a little bit reddened and she was tender.   Cystoscopy normal   Patient has mixed incontinence. She has minimal frequency and nocturia. She will return for urodynamics. She has done previous physical therapy. We will proceed accordingly. Our urodynamics nurse will use a lot of lubricant and be careful inserting the catheter. Patient tends to be a bit nervous but quite tender near the introitus. She has tried local estrogen cream before but had endometrial lining issues and a family history of breast cancer so she stopped it   On uroflowmetry patient voided 336 mL of maximal flow of 10 mL/s. She had a prolonged pattern. Residual was 0 mL  Maxim bladder capacity is 200 mL. She had increased bladder sensation. Bladder was stable. Her cough leak point pressure at 100 mL was 50 cmH2O with moderate leakage. Her Valsalva leak point pressure was 121 cm of water at 100 mL and it was severe leakage. Her cough leak point pressure at 150 mL was 91 cmH2O with moderate leakage. Her Valsalva leak point pressure was 23 cm water with severe leakage. During voluntary voiding she voided 200 mL with a maximal flow 9 mL/s. She had little bit of a prolonged pattern. Maxim voiding pressure was 22 cm of water. Residual was 0 mL. She did have some increased EMG activity during voiding. Bladder neck distended less than a centimeter.   Patient has milder mixed incontinence. Both components are  moderately significant. She has lower leak point pressures and likely would eventually benefit from a sling or bulking agent. Her urge incontinence and mild bedwetting is due to an overactive bladder. I would offer the outlet procedure before refractory OAB therapy if she does not reach her treatment goal with medication. She already knows how to do physical therapy. Because she is a bit nervous a bulking agent would be initially done in the operating room. Reassess in 5 weeks on Myrbetriq 50 mg samples and prescription   Today  Frequency stable. Patient is 80% improved. She is leaking less. She still leaks with a hard cough or sneeze. She is definitely leaking less during the day and going less often. She is hoping to become 100% dry. I will reassess her in 5 weeks on Gemtesa. If she still leaking we will discuss sling versus bulking agent especially if she has residual stress incontinence. She would need to know that her urge incontinence and bedwetting could persist or worsen after surgery. Patient does have high expectations and certainly would need to realize that bulking agents and surgery are not always curative and complications may occur.   Today  Patient failed Gemtesa. 80% better on Myrbetriq. Still has a little bit of foot on the floor syndrome first in the morning. When she has a light cough or normal cough she does not leak. When she coughs hard she still leaks. She is hoping to be dry.   She is wearing 2 pads a day that are  damp   Today I discussed a sling in detail and bulking agent in detail with full templates. Persistent or worsening incontinence and risk of retention discussed. Picture was drawn. She understands for her mild incontinence that is next the bulking agent may be a better choice and we can always go to a sling. Handout of bulking agent given. She agrees she is a bit nervous and will do this under anesthesia. Hopefully this helps her achieve continence. I think she made a  good decision. Mesh issue described. Understands that mild bedwetting is due to overactivity. Rare risk of retention with bulking agent discussed. Stopping sling if bladder empty discussed. Patient no longer has bedwetting on the Myrbetriq. Prescription and samples given. We will stop the Myrbetriq after bulking agent to assess efficacy. Persistent or worsening overactive bladder symptoms with bulking agent also discussed      ALLERGIES: Clindamycin Codeine Gemtesa - side effects Latex Penicillins Prednisone     MEDICATIONS: Aspirin  Alprazolam 0.25 mg tablet 1 tablet PO Daily  Amlodipine Besylate 5 mg tablet 1 tablet PO Daily  Atorvastatin Calcium 10 mg tablet 1 tablet PO Daily  Calcium + D3  Fluticasone Propionate 50 mcg/actuation spray, suspension  Glucosamine Complex  Levocetirizine Dihydrochloride 5 mg tablet  Losartan Potassium 25 mg tablet 1 tablet PO Daily  Magnesium 500 mg capsule  Metoprolol Succinate 25 mg tablet, extended release 24 hr 1 tablet PO Daily  MVI  Pepcid 20 mg tablet  Trazodone Hcl 50 mg tablet 1 tablet PO Daily  Tylenol Extra Strength       Notes: Tried Gemtesa- ineffective     GU PSH: Complex cystometrogram, w/ void pressure and urethral pressure profile studies, any technique - 06/14/2022 Complex Uroflow - 06/14/2022 Cystoscopy - 05/11/2022 Emg surf Electrd - 06/14/2022 Inject For cystogram - 06/14/2022 Intrabd voidng Press - 06/14/2022        PSH Notes: Breast Surgery Lumpectomy, Tubal Ligation, Injection Of Ganglion Cyst, dnc     NON-GU PSH: Aspirate/inj Ganglion Cyst - 2016 Remove Breast Lesion - 2016 Tubal Ligation - 2016           GU PMH: Incontinence w/o Sensation - 09/23/2022, - 07/08/2022, Urinary incontinence without sensory awareness, - 2016 Mixed incontinence - 08/17/2022, - 07/08/2022, - 06/14/2022, - 05/11/2022 Urinary Frequency - 08/17/2022, - 07/08/2022, - 05/11/2022 Nocturia (Stable) - 05/11/2022, Nocturia, - 2016 Nocturnal Enuresis -  05/11/2022 Stress Incontinence, Female stress incontinence - 2016     NON-GU PMH: Encounter for general adult medical examination without abnormal findings, Encounter for preventive health examination - 2016 Personal history of other diseases of the circulatory system, History of hypertension - 2016 Personal history of other diseases of the digestive system, History of esophageal reflux - 2016 Anxiety Arrhythmia GERD Hypercholesterolemia Hypertension     FAMILY HISTORY: Breast Cancer - Mother Deceased - Mother Diabetes - Runs In Family Emphysema - Runs In Family malignant neoplasm of breast - Runs In Family Myocardial Infarction - Runs In Family pneumonia - Runs in Family, Mother Tuberculosis - Runs In Family Uterine Cancer - Grandmother, Sister    SOCIAL HISTORY: Marital Status: Married Preferred Language: English; Ethnicity: Not Hispanic Or Latino; Race: White Current Smoking Status: Patient has never smoked.  <DIV'  Tobacco Use Assessment Completed:  Used Tobacco in last 30 days?   Does not use smokeless tobacco. Drinks 1 drink per week.  Patient's occupation is/was retired.      Notes: Daily caffeine consumption, Never a smoker, Two children, Employed,  Alcohol use, Death in the family, mother, Married    REVIEW OF SYSTEMS:      GU Review Female:  Patient reports hard to postpone urination and leakage of urine. Patient denies frequent urination, burning /pain with urination, get up at night to urinate, stream starts and stops, trouble starting your stream, have to strain to urinate, and being pregnant.     Gastrointestinal (Upper):  Patient denies nausea, vomiting, and indigestion/ heartburn.     Gastrointestinal (Lower):  Patient denies diarrhea and constipation.     Constitutional:  Patient denies fever, night sweats, weight loss, and fatigue.     Skin:  Patient denies skin rash/ lesion and itching.     Eyes:  Patient denies blurred vision and double vision.     Ears/  Nose/ Throat:  Patient denies sore throat and sinus problems.     Hematologic/Lymphatic:  Patient denies easy bruising and swollen glands.     Cardiovascular:  Patient denies leg swelling and chest pains.     Respiratory:  Patient denies cough and shortness of breath.     Endocrine:  Patient denies excessive thirst.     Musculoskeletal:  Patient denies back pain and joint pain.     Neurological:  Patient denies headaches and dizziness.     Psychologic:  Patient denies depression and anxiety.     VITAL SIGNS: None      PAST DATA REVIEW: None      PROCEDURES:         Urinalysis        Dipstick Dipstick Cont'd       Color: Yellow Bilirubin: Neg mg/dL       Appearance: Clear Ketones: Neg mg/dL       Specific Gravity: 1.020 Blood: Neg ery/uL       pH: 5.5 Protein: Neg mg/dL       Glucose: Neg mg/dL Urobilinogen: 0.2 mg/dL        Nitrites: Neg        Leukocyte Esterase: Neg leu/uL                ASSESSMENT:          ICD-10 Details        1 GU:  Mixed incontinence - N39.46         2  Urinary Frequency - R35.0             Notes:  We talked about a sling in detail. Pros, cons, general surgical and anesthetic risks, and other options including behavioral therapy and watchful waiting were discussed. She understands that slings are generally successful in 90% of cases for stress incontinence, 50% for urge incontinence, and that in a small % of cases the incontinence can worsen. The risk of persistent, de novo, or worsening incontinence/dysfunction was discussed. Risks were described but not limited to the discussion of injury to neighboring structures including the bowel (with possible life-threatening sepsis and colostomy), bladder, urethra, vagina (all resulting in further surgery), and ureter (resulting in re-implantation). We also talked about the risk of retention requiring urethrolysis, extrusion requiring revision, and erosion resulting in further surgery. Bleeding risks and transfusion rates  and the risk of infection were discussed. The risk of pelvic and abdominal pain syndromes, dyspareunia, and neuropathies were discussed. The need for CIC was described as well the usual post-operative course. The patient understands that she might not reach her treatment goal and that she might be worse following surgery.    The patient  has stress incontinence We talked about urethral injectables in detail. Pros, cons, general surgical and anesthetic risks, and other options including behavioral therapy, watchful waiting, and surgery were discussed. She understands that injectables are generally successful in 40-70% of cases for stress incontinence, <50% for urge incontinence, and that in a small % of cases the incontinence can worsen. Risks were described but not limited to the risk of persistent, de novo, or worsening incontinence and flow symptoms were discussed. The need for long-term CIC is possible but rare. We also talked about the risk of erosion into bladder, urethra, and/or vagina with sequelae. Bleeding, infection, pain, dysuria, dyspareunia, and urethral irritation risks were discussed. Retreatment rates were discussed. The patient understands that she might not reach her treatment goal and that she might be worse following surgery.

## 2022-10-28 ENCOUNTER — Encounter: Payer: Self-pay | Admitting: Family

## 2022-10-28 ENCOUNTER — Inpatient Hospital Stay (HOSPITAL_BASED_OUTPATIENT_CLINIC_OR_DEPARTMENT_OTHER): Payer: PPO | Admitting: Family

## 2022-10-28 ENCOUNTER — Other Ambulatory Visit: Payer: Self-pay

## 2022-10-28 ENCOUNTER — Inpatient Hospital Stay: Payer: PPO | Attending: Hematology & Oncology

## 2022-10-28 DIAGNOSIS — Z79899 Other long term (current) drug therapy: Secondary | ICD-10-CM | POA: Insufficient documentation

## 2022-10-28 DIAGNOSIS — Z881 Allergy status to other antibiotic agents status: Secondary | ICD-10-CM | POA: Insufficient documentation

## 2022-10-28 DIAGNOSIS — Z885 Allergy status to narcotic agent status: Secondary | ICD-10-CM | POA: Insufficient documentation

## 2022-10-28 DIAGNOSIS — G629 Polyneuropathy, unspecified: Secondary | ICD-10-CM | POA: Diagnosis not present

## 2022-10-28 DIAGNOSIS — Z880 Allergy status to penicillin: Secondary | ICD-10-CM | POA: Insufficient documentation

## 2022-10-28 DIAGNOSIS — Z888 Allergy status to other drugs, medicaments and biological substances status: Secondary | ICD-10-CM | POA: Insufficient documentation

## 2022-10-28 LAB — CMP (CANCER CENTER ONLY)
ALT: 24 U/L (ref 0–44)
AST: 27 U/L (ref 15–41)
Albumin: 4.3 g/dL (ref 3.5–5.0)
Alkaline Phosphatase: 85 U/L (ref 38–126)
Anion gap: 6 (ref 5–15)
BUN: 19 mg/dL (ref 8–23)
CO2: 27 mmol/L (ref 22–32)
Calcium: 9.6 mg/dL (ref 8.9–10.3)
Chloride: 107 mmol/L (ref 98–111)
Creatinine: 1.12 mg/dL — ABNORMAL HIGH (ref 0.44–1.00)
GFR, Estimated: 53 mL/min — ABNORMAL LOW (ref >60)
Glucose, Bld: 124 mg/dL — ABNORMAL HIGH (ref 70–99)
Potassium: 4.1 mmol/L (ref 3.5–5.1)
Sodium: 140 mmol/L (ref 135–145)
Total Bilirubin: 0.5 mg/dL (ref 0.3–1.2)
Total Protein: 6.6 g/dL (ref 6.5–8.1)

## 2022-10-28 LAB — CBC WITH DIFFERENTIAL (CANCER CENTER ONLY)
Abs Immature Granulocytes: 0.02 10*3/uL (ref 0.00–0.07)
Basophils Absolute: 0.1 10*3/uL (ref 0.0–0.1)
Basophils Relative: 1 %
Eosinophils Absolute: 0.2 10*3/uL (ref 0.0–0.5)
Eosinophils Relative: 3 %
HCT: 48.6 % — ABNORMAL HIGH (ref 36.0–46.0)
Hemoglobin: 16.7 g/dL — ABNORMAL HIGH (ref 12.0–15.0)
Immature Granulocytes: 0 %
Lymphocytes Relative: 33 %
Lymphs Abs: 2.2 10*3/uL (ref 0.7–4.0)
MCH: 31.4 pg (ref 26.0–34.0)
MCHC: 34.4 g/dL (ref 30.0–36.0)
MCV: 91.4 fL (ref 80.0–100.0)
Monocytes Absolute: 0.7 10*3/uL (ref 0.1–1.0)
Monocytes Relative: 11 %
Neutro Abs: 3.4 10*3/uL (ref 1.7–7.7)
Neutrophils Relative %: 52 %
Platelet Count: 207 10*3/uL (ref 150–400)
RBC: 5.32 MIL/uL — ABNORMAL HIGH (ref 3.87–5.11)
RDW: 12.6 % (ref 11.5–15.5)
WBC Count: 6.7 10*3/uL (ref 4.0–10.5)
nRBC: 0 % (ref 0.0–0.2)

## 2022-10-28 LAB — IRON AND IRON BINDING CAPACITY (CC-WL,HP ONLY)
Iron: 143 ug/dL (ref 28–170)
Saturation Ratios: 34 % — ABNORMAL HIGH (ref 10.4–31.8)
TIBC: 421 ug/dL (ref 250–450)
UIBC: 278 ug/dL (ref 148–442)

## 2022-10-28 LAB — FERRITIN: Ferritin: 50 ng/mL (ref 11–307)

## 2022-10-28 NOTE — Progress Notes (Signed)
Hematology and Oncology Follow Up Visit  Shelby Harrington [id number] 08/06/1950 72 y.o. 10/28/2022   Principle Diagnosis:  Hemochromatosis - Heterozygous H63D mutation   Current Therapy:        Aspirin 81 mg PO daily Phlebotomy to keep ferritin < 50 and iron sat < 30%    Interim History:  Shelby Harrington is here today for follow-up. She is doing well but has had some issues with bladder incontinence. She is scheduled for cystoscopy with BULKAMID injection to help elevate her bladder.  No blood loss, bruising or petechiae.  No fever, chills, n/v, cough, rash, dizziness, SOB, chest pain, palpitations, abdominal pain or changes in bowel habits.  No swelling in her extremities at this time.  Neuropathy in her hands and feet waxes and wanes. She notes that her blood glucose has been running high and her symptoms have been worse.  No falls or syncope reported.  Appetite and hydration are good. Weight is stable at 167 lbs.   ECOG Performance Status: 1 - Symptomatic but completely ambulatory  Medications:  Allergies as of 10/28/2022       Reactions   Prednisone Palpitations   Latex Other (See Comments)   Skin blisters   Clindamycin/lincomycin Diarrhea   Codeine Rash   Penicillins Rash   Tape Rash   Adhesive tape        Medication List        Accurate as of October 28, 2022  1:39 PM. If you have any questions, ask your nurse or doctor.          acetaminophen 500 MG tablet Commonly known as: TYLENOL Take 500 mg by mouth every 6 (six) hours as needed for moderate pain.   ALPRAZolam 0.25 MG tablet Commonly known as: XANAX Take 0.25 mg by mouth 2 (two) times daily as needed for anxiety.   amLODipine 5 MG tablet Commonly known as: NORVASC Take 5 mg by mouth daily.   aspirin 81 MG tablet Take 81 mg by mouth daily.   atorvastatin 10 MG tablet Commonly known as: LIPITOR Take 10 mg by mouth at bedtime.   betamethasone valerate 0.1 % cream Commonly known as:  VALISONE Apply 1 application. topically 2 (two) times daily as needed (rash).   CALCIUM PO Take 1,200 mg by mouth daily.   CENTRUM SILVER PO Take 1 tablet by mouth daily.   Cetirizine HCl 10 MG Caps Commonly known as: ZyrTEC Allergy One at bedtime   Dialyvite Vitamin D 5000 125 MCG (5000 UT) capsule Generic drug: Cholecalciferol Take 5,000 Units by mouth daily.   famotidine 20 MG tablet Commonly known as: PEPCID Take 20 mg by mouth daily as needed for heartburn or indigestion.   fluticasone 50 MCG/ACT nasal spray Commonly known as: FLONASE Place 2 sprays into both nostrils daily.   GLUCOSAMINE CHONDR COMPLEX PO Take 1 capsule by mouth daily as needed (rash).   losartan 25 MG tablet Commonly known as: COZAAR Take 12.5 mg by mouth daily.   Magnesium 250 MG Tabs Take 250 mg by mouth daily.   metoprolol succinate 25 MG 24 hr tablet Commonly known as: TOPROL-XL Take 1 tablet (25 mg total) by mouth in the morning and at bedtime. Take 25 mg (one tablet) in the morning and 12.5 mg (half tablet) in the evening.   MIRABEGRON ER PO Take 1 tablet by mouth daily.   traZODone 50 MG tablet Commonly known as: DESYREL Take 25-50 mg by mouth at bedtime.   vitamin C 1000  MG tablet Take 1,000 mg by mouth daily.        Allergies:  Allergies  Allergen Reactions   Prednisone Palpitations   Latex Other (See Comments)    Skin blisters   Clindamycin/Lincomycin Diarrhea   Codeine Rash   Penicillins Rash   Tape Rash    Adhesive tape    Past Medical History, Surgical history, Social history, and Family History were reviewed and updated.  Review of Systems: All other 10 point review of systems is negative.   Physical Exam:  vitals were not taken for this visit.   Wt Readings from Last 3 Encounters:  04/29/22 164 lb 1.9 oz (74.4 kg)  03/22/22 163 lb 3.2 oz (74 kg)  11/26/21 154 lb (69.9 kg)    Ocular: Sclerae unicteric, pupils equal, round and reactive to  light Ear-nose-throat: Oropharynx clear, dentition fair Lymphatic: No cervical or supraclavicular adenopathy Lungs no rales or rhonchi, good excursion bilaterally Heart regular rate and rhythm, no murmur appreciated Abd soft, nontender, positive bowel sounds MSK no focal spinal tenderness, no joint edema Neuro: non-focal, well-oriented, appropriate affect Breasts: Deferred   Lab Results  Component Value Date   WBC 6.7 10/28/2022   HGB 16.7 (H) 10/28/2022   HCT 48.6 (H) 10/28/2022   MCV 91.4 10/28/2022   PLT 207 10/28/2022   Lab Results  Component Value Date   FERRITIN 25 07/29/2022   IRON 129 07/29/2022   TIBC 391 07/29/2022   UIBC 262 07/29/2022   IRONPCTSAT 33 (H) 07/29/2022   Lab Results  Component Value Date   RBC 5.32 (H) 10/28/2022   No results found for: "KPAFRELGTCHN", "LAMBDASER", "KAPLAMBRATIO" No results found for: "IGGSERUM", "IGA", "IGMSERUM" No results found for: "TOTALPROTELP", "ALBUMINELP", "A1GS", "A2GS", "BETS", "BETA2SER", "GAMS", "MSPIKE", "SPEI"   Chemistry      Component Value Date/Time   NA 139 07/29/2022 1058   NA 142 09/14/2021 0921   NA 148 (H) 06/29/2017 0813   NA 143 03/30/2017 0805   K 4.3 07/29/2022 1058   K 3.8 06/29/2017 0813   K 4.0 03/30/2017 0805   CL 107 07/29/2022 1058   CL 105 06/29/2017 0813   CO2 25 07/29/2022 1058   CO2 28 06/29/2017 0813   CO2 26 03/30/2017 0805   BUN 17 07/29/2022 1058   BUN 16 09/14/2021 0921   BUN 21 06/29/2017 0813   BUN 18.2 03/30/2017 0805   CREATININE 1.12 (H) 07/29/2022 1058   CREATININE 1.3 (H) 06/29/2017 0813   CREATININE 1.0 03/30/2017 0805      Component Value Date/Time   CALCIUM 9.5 07/29/2022 1058   CALCIUM 9.5 06/29/2017 0813   CALCIUM 9.6 03/30/2017 0805   ALKPHOS 85 07/29/2022 1058   ALKPHOS 78 06/29/2017 0813   ALKPHOS 73 03/30/2017 0805   AST 22 07/29/2022 1058   AST 29 03/30/2017 0805   ALT 18 07/29/2022 1058   ALT 32 06/29/2017 0813   ALT 26 03/30/2017 0805   BILITOT 0.6  07/29/2022 1058   BILITOT 0.67 03/30/2017 0805       Impression and Plan: Shelby Harrington is a very pleasant 72 yo caucasian female with hemochromatosis, heterozygous for the H63D mutation.  Iron studies are pending. We will set her up for phlebotomy if needed.  Lab check in 3 months, follow-up in 6 months.    Eileen Stanford, NP 4/26/20241:39 PM

## 2022-11-01 ENCOUNTER — Ambulatory Visit (HOSPITAL_BASED_OUTPATIENT_CLINIC_OR_DEPARTMENT_OTHER): Payer: PPO | Admitting: Anesthesiology

## 2022-11-01 ENCOUNTER — Encounter (HOSPITAL_BASED_OUTPATIENT_CLINIC_OR_DEPARTMENT_OTHER)
Admission: RE | Disposition: A | Discharge status: Home / Self Care | Payer: Self-pay | Source: Home / Self Care | Attending: Urology

## 2022-11-01 ENCOUNTER — Encounter (HOSPITAL_BASED_OUTPATIENT_CLINIC_OR_DEPARTMENT_OTHER): Payer: Self-pay | Admitting: Urology

## 2022-11-01 ENCOUNTER — Other Ambulatory Visit: Payer: Self-pay

## 2022-11-01 ENCOUNTER — Ambulatory Visit (HOSPITAL_BASED_OUTPATIENT_CLINIC_OR_DEPARTMENT_OTHER)
Admission: RE | Admit: 2022-11-01 | Discharge: 2022-11-01 | Disposition: A | Discharge status: Home / Self Care | Payer: PPO | Attending: Urology | Admitting: Urology

## 2022-11-01 DIAGNOSIS — I471 Supraventricular tachycardia, unspecified: Secondary | ICD-10-CM

## 2022-11-01 DIAGNOSIS — I4891 Unspecified atrial fibrillation: Secondary | ICD-10-CM | POA: Insufficient documentation

## 2022-11-01 DIAGNOSIS — I10 Essential (primary) hypertension: Secondary | ICD-10-CM | POA: Diagnosis not present

## 2022-11-01 DIAGNOSIS — J45909 Unspecified asthma, uncomplicated: Secondary | ICD-10-CM | POA: Insufficient documentation

## 2022-11-01 DIAGNOSIS — N3642 Intrinsic sphincter deficiency (ISD): Secondary | ICD-10-CM | POA: Insufficient documentation

## 2022-11-01 DIAGNOSIS — Z79899 Other long term (current) drug therapy: Secondary | ICD-10-CM | POA: Diagnosis not present

## 2022-11-01 DIAGNOSIS — N393 Stress incontinence (female) (male): Secondary | ICD-10-CM

## 2022-11-01 DIAGNOSIS — Z8249 Family history of ischemic heart disease and other diseases of the circulatory system: Secondary | ICD-10-CM | POA: Diagnosis not present

## 2022-11-01 DIAGNOSIS — N289 Disorder of kidney and ureter, unspecified: Secondary | ICD-10-CM | POA: Diagnosis not present

## 2022-11-01 DIAGNOSIS — N3946 Mixed incontinence: Secondary | ICD-10-CM | POA: Insufficient documentation

## 2022-11-01 DIAGNOSIS — N3942 Incontinence without sensory awareness: Secondary | ICD-10-CM | POA: Diagnosis not present

## 2022-11-01 HISTORY — PX: CYSTOSCOPY WITH INJECTION: SHX1424

## 2022-11-01 HISTORY — DX: Chronic kidney disease, unspecified: N18.9

## 2022-11-01 HISTORY — DX: Anxiety disorder, unspecified: F41.9

## 2022-11-01 HISTORY — DX: Cardiac arrhythmia, unspecified: I49.9

## 2022-11-01 LAB — POCT I-STAT, CHEM 8
BUN: 9 mg/dL (ref 8–23)
Calcium, Ion: 1.09 mmol/L — ABNORMAL LOW (ref 1.15–1.40)
Chloride: 107 mmol/L (ref 98–111)
Creatinine, Ser: 0.8 mg/dL (ref 0.44–1.00)
Glucose, Bld: 96 mg/dL (ref 70–99)
HCT: 47 % — ABNORMAL HIGH (ref 36.0–46.0)
Hemoglobin: 16 g/dL — ABNORMAL HIGH (ref 12.0–15.0)
Potassium: 3.8 mmol/L (ref 3.5–5.1)
Sodium: 143 mmol/L (ref 135–145)
TCO2: 23 mmol/L (ref 22–32)

## 2022-11-01 SURGERY — CYSTOSCOPY, WITH INJECTION OF BLADDER NECK OR BLADDER WALL
Anesthesia: General | Site: Urethra

## 2022-11-01 MED ORDER — OXYCODONE HCL 5 MG/5ML PO SOLN: 5.0000 mg | Freq: Once | ORAL | Status: DC | PRN

## 2022-11-01 MED ORDER — FENTANYL CITRATE (PF) 100 MCG/2ML IJ SOLN
INTRAMUSCULAR | Status: AC
  Filled 2022-11-01: qty 2

## 2022-11-01 MED ORDER — PROPOFOL 10 MG/ML IV BOLUS
INTRAVENOUS | Status: DC | PRN
  Administered 2022-11-01: 150 mg via INTRAVENOUS

## 2022-11-01 MED ORDER — FENTANYL CITRATE (PF) 100 MCG/2ML IJ SOLN: 25.0000 ug | INTRAMUSCULAR | Status: DC | PRN

## 2022-11-01 MED ORDER — SODIUM CHLORIDE 0.9 % IV SOLN: INTRAVENOUS | Status: DC

## 2022-11-01 MED ORDER — LIDOCAINE HCL (CARDIAC) PF 100 MG/5ML IV SOSY
PREFILLED_SYRINGE | INTRAVENOUS | Status: DC | PRN
  Administered 2022-11-01: 50 mg via INTRAVENOUS

## 2022-11-01 MED ORDER — ONDANSETRON HCL 4 MG/2ML IJ SOLN: 4.0000 mg | Freq: Four times a day (QID) | INTRAMUSCULAR | Status: DC | PRN

## 2022-11-01 MED ORDER — EPHEDRINE SULFATE (PRESSORS) 50 MG/ML IJ SOLN
INTRAMUSCULAR | Status: DC | PRN
  Administered 2022-11-01 (×2): 5 mg via INTRAVENOUS

## 2022-11-01 MED ORDER — FENTANYL CITRATE (PF) 100 MCG/2ML IJ SOLN
INTRAMUSCULAR | Status: DC | PRN
  Administered 2022-11-01: 50 ug via INTRAVENOUS

## 2022-11-01 MED ORDER — CIPROFLOXACIN IN D5W 400 MG/200ML IV SOLN: 400.0000 mg | INTRAVENOUS | Status: DC

## 2022-11-01 MED ORDER — STERILE WATER FOR IRRIGATION IR SOLN
Status: DC | PRN
  Administered 2022-11-01: 1500 mL

## 2022-11-01 MED ORDER — CIPROFLOXACIN IN D5W 400 MG/200ML IV SOLN
INTRAVENOUS | Status: AC
  Filled 2022-11-01: qty 200

## 2022-11-01 MED ORDER — OXYCODONE HCL 5 MG PO TABS: 5.0000 mg | ORAL_TABLET | Freq: Once | ORAL | Status: DC | PRN

## 2022-11-01 MED ORDER — ONDANSETRON HCL 4 MG/2ML IJ SOLN
INTRAMUSCULAR | Status: DC | PRN
  Administered 2022-11-01: 4 mg via INTRAVENOUS

## 2022-11-01 SURGICAL SUPPLY — 17 items
BAG DRAIN URO-CYSTO SKYTR STRL (DRAIN) ×2 IMPLANT
BAG DRN UROCATH (DRAIN) ×1
CATH ROBINSON RED A/P 14FR (CATHETERS) ×2 IMPLANT
CLOTH BEACON ORANGE TIMEOUT ST (SAFETY) ×2 IMPLANT
GLOVE BIO SURGEON STRL SZ7.5 (GLOVE) ×2 IMPLANT
GOWN STRL REUS W/TWL XL LVL3 (GOWN DISPOSABLE) ×2 IMPLANT
KIT TURNOVER CYSTO (KITS) ×2 IMPLANT
MANIFOLD NEPTUNE II (INSTRUMENTS) ×2 IMPLANT
NDL ASPIRATION 22 (NEEDLE) IMPLANT
NDL SAFETY ECLIP 18X1.5 (MISCELLANEOUS) IMPLANT
NEEDLE ASPIRATION 22 (NEEDLE) IMPLANT
PACK CYSTO (CUSTOM PROCEDURE TRAY) ×2 IMPLANT
SLEEVE SCD COMPRESS KNEE MED (STOCKING) ×2 IMPLANT
SYR 20ML LL LF (SYRINGE) IMPLANT
SYSTEM URETHRAL BULK BULKAMID (Female Continence) IMPLANT
TUBE CONNECTING 12X1/4 (SUCTIONS) IMPLANT
WATER STERILE IRR 3000ML UROMA (IV SOLUTION) ×2 IMPLANT

## 2022-11-01 NOTE — Anesthesia Procedure Notes (Signed)
Procedure Name: LMA Insertion Date/Time: 11/01/2022 8:26 AM  Performed by: Earmon Phoenix, CRNAPre-anesthesia Checklist: Patient identified, Emergency Drugs available, Suction available, Patient being monitored and Timeout performed Patient Re-evaluated:Patient Re-evaluated prior to induction Oxygen Delivery Method: Circle system utilized Preoxygenation: Pre-oxygenation with 100% oxygen Induction Type: IV induction Ventilation: Mask ventilation without difficulty LMA: LMA inserted LMA Size: 3.0 Number of attempts: 1 Placement Confirmation: positive ETCO2 and breath sounds checked- equal and bilateral Tube secured with: Tape Dental Injury: Teeth and Oropharynx as per pre-operative assessment

## 2022-11-01 NOTE — Discharge Instructions (Addendum)
I have reviewed discharge instructions in detail with the patient. They will follow-up with me or their physician as scheduled. My nurse will also be calling the patients as per protocol.        Post Anesthesia Home Care Instructions  Activity: Get plenty of rest for the remainder of the day. A responsible individual must stay with you for 24 hours following the procedure.  For the next 24 hours, DO NOT: -Drive a car -Operate machinery -Drink alcoholic beverages -Take any medication unless instructed by your physician -Make any legal decisions or sign important papers.  Meals: Start with liquid foods such as gelatin or soup. Progress to regular foods as tolerated. Avoid greasy, spicy, heavy foods. If nausea and/or vomiting occur, drink only clear liquids until the nausea and/or vomiting subsides. Call your physician if vomiting continues.  Special Instructions/Symptoms: Your throat may feel dry or sore from the anesthesia or the breathing tube placed in your throat during surgery. If this causes discomfort, gargle with warm salt water. The discomfort should disappear within 24 hours.  

## 2022-11-01 NOTE — Op Note (Signed)
Preoperative diagnosis: Stress urinary incontinence and intrinsic sphincter deficiency Postoperative diagnosis: Stress urinary incontinence and intrinsic sphincter deficiency Surgery: Cystoscopy and injection of Bulkamid Surgeon: Dr. Lorin Picket Sagal Gayton  Patient has the above diagnoses and consented the above procedure.  Extra care was taken leg positioning.  She received preoperative antibiotics.  I used the Bulkamid scope.  She did not a straight long urethra.  Bladder mucosa and trigone were normal.  With the described technique I injected at 7:00 and 5:00 and 2:00 and 10:00.  The first 2 injections collected the mid urethra beautifully.  The last 2 or smaller injections feeling in areas.  I only use 1 syringe.  I was very happy with the coaptation, depth of the blebs and there was no bleeding  I gently placed a red rubber catheter 14 [place] and partially drained bladder.  She will be followed as per protocol.  Patient is currently off her Myrbetriq

## 2022-11-01 NOTE — Interval H&P Note (Signed)
History and Physical Interval Note:  11/01/2022 7:21 AM  Shelby Harrington  has presented today for surgery, with the diagnosis of INTRINSIC SPHINCTER DEFICIENCY.  The various methods of treatment have been discussed with the patient and family. After consideration of risks, benefits and other options for treatment, the patient has consented to  Procedure(s): CYSTOSCOPY WITH INJECTION BULKAMID (N/A) as a surgical intervention.  The patient's history has been reviewed, patient examined, no change in status, stable for surgery.  I have reviewed the patient's chart and labs.  Questions were answered to the patient's satisfaction.     Bellarose Burtt A Lerline Valdivia

## 2022-11-01 NOTE — Anesthesia Preprocedure Evaluation (Signed)
Anesthesia Evaluation  Patient identified by MRN, date of birth, ID band Patient awake    Reviewed: Allergy & Precautions, H&P , NPO status , Patient's Chart, lab work & pertinent test results  Airway Mallampati: II   Neck ROM: full    Dental   Pulmonary shortness of breath, asthma    breath sounds clear to auscultation       Cardiovascular hypertension, + dysrhythmias Atrial Fibrillation  Rhythm:regular Rate:Normal     Neuro/Psych   Anxiety        GI/Hepatic   Endo/Other    Renal/GU Renal disease     Musculoskeletal   Abdominal   Peds  Hematology hemochromatosis   Anesthesia Other Findings   Reproductive/Obstetrics                             Anesthesia Physical Anesthesia Plan  ASA: 2  Anesthesia Plan: General   Post-op Pain Management:    Induction: Intravenous  PONV Risk Score and Plan: 3 and Ondansetron, Dexamethasone and Treatment may vary due to age or medical condition  Airway Management Planned: LMA  Additional Equipment:   Intra-op Plan:   Post-operative Plan: Extubation in OR  Informed Consent: I have reviewed the patients History and Physical, chart, labs and discussed the procedure including the risks, benefits and alternatives for the proposed anesthesia with the patient or authorized representative who has indicated his/her understanding and acceptance.     Dental advisory given  Plan Discussed with: CRNA, Anesthesiologist and Surgeon  Anesthesia Plan Comments:        Anesthesia Quick Evaluation

## 2022-11-01 NOTE — Progress Notes (Signed)
Bladder scan 113 after patient voided 350 reported to Dr. Sherron Monday while he was here to see patient. Okay for D/C per Dr. Sherron Monday.

## 2022-11-01 NOTE — Anesthesia Postprocedure Evaluation (Signed)
Anesthesia Post Note  Patient: Shelby Harrington  Procedure(s) Performed: CYSTOSCOPY WITH INJECTION BULKAMID (Urethra)     Patient location during evaluation: PACU Anesthesia Type: General Level of consciousness: awake and alert Pain management: pain level controlled Vital Signs Assessment: post-procedure vital signs reviewed and stable Respiratory status: spontaneous breathing, nonlabored ventilation, respiratory function stable and patient connected to nasal cannula oxygen Cardiovascular status: blood pressure returned to baseline and stable Postop Assessment: no apparent nausea or vomiting Anesthetic complications: no   No notable events documented.  Last Vitals:  Vitals:   11/01/22 0913 11/01/22 1000  BP: 120/75 (!) 171/80  Pulse:  (!) 54  Resp:  16  Temp: (!) 36.4 C (!) 36.4 C  SpO2:  98%    Last Pain:  Vitals:   11/01/22 1000  TempSrc:   PainSc: 0-No pain                 Walta Bellville S

## 2022-11-01 NOTE — Transfer of Care (Signed)
Immediate Anesthesia Transfer of Care Note  Patient: Shelby Harrington  Procedure(s) Performed: CYSTOSCOPY WITH INJECTION BULKAMID (Urethra)  Patient Location: PACU  Anesthesia Type:General  Level of Consciousness: awake, alert , oriented, and patient cooperative  Airway & Oxygen Therapy: Patient Spontanous Breathing and Patient connected to nasal cannula oxygen  Post-op Assessment: Report given to RN and Post -op Vital signs reviewed and stable  Post vital signs: Reviewed and stable  Last Vitals:  Vitals Value Taken Time  BP 106/68 11/01/22 0848  Temp 36.4 C 11/01/22 0849  Pulse 60 11/01/22 0850  Resp 16 11/01/22 0850  SpO2 94 % 11/01/22 0850  Vitals shown include unvalidated device data.  Last Pain:  Vitals:   11/01/22 0628  TempSrc: Oral         Complications: No notable events documented.

## 2022-11-03 ENCOUNTER — Encounter (HOSPITAL_BASED_OUTPATIENT_CLINIC_OR_DEPARTMENT_OTHER): Payer: Self-pay | Admitting: Urology

## 2022-11-04 DIAGNOSIS — N3281 Overactive bladder: Secondary | ICD-10-CM | POA: Diagnosis not present

## 2022-11-04 DIAGNOSIS — F419 Anxiety disorder, unspecified: Secondary | ICD-10-CM | POA: Diagnosis not present

## 2022-11-04 DIAGNOSIS — J3089 Other allergic rhinitis: Secondary | ICD-10-CM | POA: Diagnosis not present

## 2022-11-04 DIAGNOSIS — E559 Vitamin D deficiency, unspecified: Secondary | ICD-10-CM | POA: Diagnosis not present

## 2022-11-04 DIAGNOSIS — N183 Chronic kidney disease, stage 3 unspecified: Secondary | ICD-10-CM | POA: Diagnosis not present

## 2022-11-04 DIAGNOSIS — N1831 Chronic kidney disease, stage 3a: Secondary | ICD-10-CM | POA: Diagnosis not present

## 2022-11-04 DIAGNOSIS — E669 Obesity, unspecified: Secondary | ICD-10-CM | POA: Diagnosis not present

## 2022-11-04 DIAGNOSIS — F5101 Primary insomnia: Secondary | ICD-10-CM | POA: Diagnosis not present

## 2022-11-04 DIAGNOSIS — I129 Hypertensive chronic kidney disease with stage 1 through stage 4 chronic kidney disease, or unspecified chronic kidney disease: Secondary | ICD-10-CM | POA: Diagnosis not present

## 2022-11-04 DIAGNOSIS — N952 Postmenopausal atrophic vaginitis: Secondary | ICD-10-CM | POA: Diagnosis not present

## 2022-11-04 DIAGNOSIS — I700 Atherosclerosis of aorta: Secondary | ICD-10-CM | POA: Diagnosis not present

## 2022-11-04 DIAGNOSIS — M25562 Pain in left knee: Secondary | ICD-10-CM | POA: Diagnosis not present

## 2022-11-04 DIAGNOSIS — Z0000 Encounter for general adult medical examination without abnormal findings: Secondary | ICD-10-CM | POA: Diagnosis not present

## 2022-11-09 DIAGNOSIS — M25551 Pain in right hip: Secondary | ICD-10-CM | POA: Diagnosis not present

## 2022-11-09 DIAGNOSIS — M25562 Pain in left knee: Secondary | ICD-10-CM | POA: Diagnosis not present

## 2022-11-09 DIAGNOSIS — M6281 Muscle weakness (generalized): Secondary | ICD-10-CM | POA: Diagnosis not present

## 2022-11-11 DIAGNOSIS — M25562 Pain in left knee: Secondary | ICD-10-CM | POA: Diagnosis not present

## 2022-11-11 DIAGNOSIS — M6281 Muscle weakness (generalized): Secondary | ICD-10-CM | POA: Diagnosis not present

## 2022-11-11 DIAGNOSIS — M25551 Pain in right hip: Secondary | ICD-10-CM | POA: Diagnosis not present

## 2022-11-14 DIAGNOSIS — M25551 Pain in right hip: Secondary | ICD-10-CM | POA: Diagnosis not present

## 2022-11-14 DIAGNOSIS — M25562 Pain in left knee: Secondary | ICD-10-CM | POA: Diagnosis not present

## 2022-11-14 DIAGNOSIS — M6281 Muscle weakness (generalized): Secondary | ICD-10-CM | POA: Diagnosis not present

## 2022-11-16 DIAGNOSIS — M25562 Pain in left knee: Secondary | ICD-10-CM | POA: Diagnosis not present

## 2022-11-16 DIAGNOSIS — M25551 Pain in right hip: Secondary | ICD-10-CM | POA: Diagnosis not present

## 2022-11-16 DIAGNOSIS — M6281 Muscle weakness (generalized): Secondary | ICD-10-CM | POA: Diagnosis not present

## 2022-11-17 DIAGNOSIS — N3942 Incontinence without sensory awareness: Secondary | ICD-10-CM | POA: Diagnosis not present

## 2022-11-17 DIAGNOSIS — N3946 Mixed incontinence: Secondary | ICD-10-CM | POA: Diagnosis not present

## 2022-11-17 DIAGNOSIS — N3944 Nocturnal enuresis: Secondary | ICD-10-CM | POA: Diagnosis not present

## 2022-11-21 DIAGNOSIS — M6281 Muscle weakness (generalized): Secondary | ICD-10-CM | POA: Diagnosis not present

## 2022-11-21 DIAGNOSIS — M25551 Pain in right hip: Secondary | ICD-10-CM | POA: Diagnosis not present

## 2022-11-21 DIAGNOSIS — M25562 Pain in left knee: Secondary | ICD-10-CM | POA: Diagnosis not present

## 2022-11-23 DIAGNOSIS — M25562 Pain in left knee: Secondary | ICD-10-CM | POA: Diagnosis not present

## 2022-11-23 DIAGNOSIS — M25551 Pain in right hip: Secondary | ICD-10-CM | POA: Diagnosis not present

## 2022-11-23 DIAGNOSIS — M6281 Muscle weakness (generalized): Secondary | ICD-10-CM | POA: Diagnosis not present

## 2022-12-02 DIAGNOSIS — M25562 Pain in left knee: Secondary | ICD-10-CM | POA: Diagnosis not present

## 2022-12-02 DIAGNOSIS — M25551 Pain in right hip: Secondary | ICD-10-CM | POA: Diagnosis not present

## 2022-12-02 DIAGNOSIS — M6281 Muscle weakness (generalized): Secondary | ICD-10-CM | POA: Diagnosis not present

## 2022-12-05 DIAGNOSIS — M25551 Pain in right hip: Secondary | ICD-10-CM | POA: Diagnosis not present

## 2022-12-05 DIAGNOSIS — M6281 Muscle weakness (generalized): Secondary | ICD-10-CM | POA: Diagnosis not present

## 2022-12-05 DIAGNOSIS — M25562 Pain in left knee: Secondary | ICD-10-CM | POA: Diagnosis not present

## 2022-12-08 DIAGNOSIS — M25551 Pain in right hip: Secondary | ICD-10-CM | POA: Diagnosis not present

## 2022-12-08 DIAGNOSIS — M25562 Pain in left knee: Secondary | ICD-10-CM | POA: Diagnosis not present

## 2022-12-08 DIAGNOSIS — M6281 Muscle weakness (generalized): Secondary | ICD-10-CM | POA: Diagnosis not present

## 2022-12-12 DIAGNOSIS — M25551 Pain in right hip: Secondary | ICD-10-CM | POA: Diagnosis not present

## 2022-12-12 DIAGNOSIS — M6281 Muscle weakness (generalized): Secondary | ICD-10-CM | POA: Diagnosis not present

## 2022-12-12 DIAGNOSIS — M25562 Pain in left knee: Secondary | ICD-10-CM | POA: Diagnosis not present

## 2022-12-14 DIAGNOSIS — M6281 Muscle weakness (generalized): Secondary | ICD-10-CM | POA: Diagnosis not present

## 2022-12-14 DIAGNOSIS — M25551 Pain in right hip: Secondary | ICD-10-CM | POA: Diagnosis not present

## 2022-12-14 DIAGNOSIS — M25562 Pain in left knee: Secondary | ICD-10-CM | POA: Diagnosis not present

## 2022-12-19 DIAGNOSIS — M25562 Pain in left knee: Secondary | ICD-10-CM | POA: Diagnosis not present

## 2022-12-19 DIAGNOSIS — M6281 Muscle weakness (generalized): Secondary | ICD-10-CM | POA: Diagnosis not present

## 2022-12-19 DIAGNOSIS — M25551 Pain in right hip: Secondary | ICD-10-CM | POA: Diagnosis not present

## 2022-12-22 DIAGNOSIS — M25562 Pain in left knee: Secondary | ICD-10-CM | POA: Diagnosis not present

## 2022-12-22 DIAGNOSIS — M6281 Muscle weakness (generalized): Secondary | ICD-10-CM | POA: Diagnosis not present

## 2022-12-22 DIAGNOSIS — M25551 Pain in right hip: Secondary | ICD-10-CM | POA: Diagnosis not present

## 2022-12-27 DIAGNOSIS — M25562 Pain in left knee: Secondary | ICD-10-CM | POA: Diagnosis not present

## 2022-12-27 DIAGNOSIS — M6281 Muscle weakness (generalized): Secondary | ICD-10-CM | POA: Diagnosis not present

## 2022-12-27 DIAGNOSIS — M25551 Pain in right hip: Secondary | ICD-10-CM | POA: Diagnosis not present

## 2022-12-29 DIAGNOSIS — M25551 Pain in right hip: Secondary | ICD-10-CM | POA: Diagnosis not present

## 2022-12-29 DIAGNOSIS — M25562 Pain in left knee: Secondary | ICD-10-CM | POA: Diagnosis not present

## 2022-12-29 DIAGNOSIS — M6281 Muscle weakness (generalized): Secondary | ICD-10-CM | POA: Diagnosis not present

## 2023-01-02 DIAGNOSIS — M25551 Pain in right hip: Secondary | ICD-10-CM | POA: Diagnosis not present

## 2023-01-02 DIAGNOSIS — M6281 Muscle weakness (generalized): Secondary | ICD-10-CM | POA: Diagnosis not present

## 2023-01-02 DIAGNOSIS — M25562 Pain in left knee: Secondary | ICD-10-CM | POA: Diagnosis not present

## 2023-01-04 DIAGNOSIS — M25562 Pain in left knee: Secondary | ICD-10-CM | POA: Diagnosis not present

## 2023-01-04 DIAGNOSIS — M25551 Pain in right hip: Secondary | ICD-10-CM | POA: Diagnosis not present

## 2023-01-04 DIAGNOSIS — M6281 Muscle weakness (generalized): Secondary | ICD-10-CM | POA: Diagnosis not present

## 2023-01-09 DIAGNOSIS — M25562 Pain in left knee: Secondary | ICD-10-CM | POA: Diagnosis not present

## 2023-01-09 DIAGNOSIS — M25551 Pain in right hip: Secondary | ICD-10-CM | POA: Diagnosis not present

## 2023-01-09 DIAGNOSIS — M6281 Muscle weakness (generalized): Secondary | ICD-10-CM | POA: Diagnosis not present

## 2023-01-10 ENCOUNTER — Ambulatory Visit
Admission: RE | Admit: 2023-01-10 | Discharge: 2023-01-10 | Disposition: A | Discharge status: Home / Self Care | Payer: PPO | Source: Ambulatory Visit | Attending: Obstetrics and Gynecology | Admitting: Obstetrics and Gynecology

## 2023-01-10 DIAGNOSIS — E349 Endocrine disorder, unspecified: Secondary | ICD-10-CM | POA: Diagnosis not present

## 2023-01-10 DIAGNOSIS — M8588 Other specified disorders of bone density and structure, other site: Secondary | ICD-10-CM | POA: Diagnosis not present

## 2023-01-10 DIAGNOSIS — N958 Other specified menopausal and perimenopausal disorders: Secondary | ICD-10-CM | POA: Diagnosis not present

## 2023-01-10 DIAGNOSIS — N189 Chronic kidney disease, unspecified: Secondary | ICD-10-CM | POA: Diagnosis not present

## 2023-01-10 DIAGNOSIS — M8580 Other specified disorders of bone density and structure, unspecified site: Secondary | ICD-10-CM

## 2023-01-11 DIAGNOSIS — M6281 Muscle weakness (generalized): Secondary | ICD-10-CM | POA: Diagnosis not present

## 2023-01-11 DIAGNOSIS — M25551 Pain in right hip: Secondary | ICD-10-CM | POA: Diagnosis not present

## 2023-01-11 DIAGNOSIS — M25562 Pain in left knee: Secondary | ICD-10-CM | POA: Diagnosis not present

## 2023-01-27 ENCOUNTER — Inpatient Hospital Stay: Payer: PPO | Attending: Hematology & Oncology

## 2023-01-27 DIAGNOSIS — Z79899 Other long term (current) drug therapy: Secondary | ICD-10-CM | POA: Diagnosis not present

## 2023-01-27 LAB — CBC WITH DIFFERENTIAL (CANCER CENTER ONLY)
Abs Immature Granulocytes: 0.02 10*3/uL (ref 0.00–0.07)
Basophils Absolute: 0.1 10*3/uL (ref 0.0–0.1)
Basophils Relative: 1 %
Eosinophils Absolute: 0.3 10*3/uL (ref 0.0–0.5)
Eosinophils Relative: 5 %
HCT: 47.9 % — ABNORMAL HIGH (ref 36.0–46.0)
Hemoglobin: 16.3 g/dL — ABNORMAL HIGH (ref 12.0–15.0)
Immature Granulocytes: 0 %
Lymphocytes Relative: 44 %
Lymphs Abs: 2.7 10*3/uL (ref 0.7–4.0)
MCH: 31.7 pg (ref 26.0–34.0)
MCHC: 34 g/dL (ref 30.0–36.0)
MCV: 93.2 fL (ref 80.0–100.0)
Monocytes Absolute: 0.7 10*3/uL (ref 0.1–1.0)
Monocytes Relative: 12 %
Neutro Abs: 2.3 10*3/uL (ref 1.7–7.7)
Neutrophils Relative %: 38 %
Platelet Count: 195 10*3/uL (ref 150–400)
RBC: 5.14 MIL/uL — ABNORMAL HIGH (ref 3.87–5.11)
RDW: 12.4 % (ref 11.5–15.5)
WBC Count: 6.1 10*3/uL (ref 4.0–10.5)
nRBC: 0 % (ref 0.0–0.2)

## 2023-01-27 LAB — IRON AND IRON BINDING CAPACITY (CC-WL,HP ONLY)
Iron: 72 ug/dL (ref 28–170)
Saturation Ratios: 19 % (ref 10.4–31.8)
TIBC: 371 ug/dL (ref 250–450)
UIBC: 299 ug/dL (ref 148–442)

## 2023-01-27 LAB — CMP (CANCER CENTER ONLY)
ALT: 21 U/L (ref 0–44)
AST: 26 U/L (ref 15–41)
Albumin: 4.2 g/dL (ref 3.5–5.0)
Alkaline Phosphatase: 89 U/L (ref 38–126)
Anion gap: 7 (ref 5–15)
BUN: 13 mg/dL (ref 8–23)
CO2: 27 mmol/L (ref 22–32)
Calcium: 9.5 mg/dL (ref 8.9–10.3)
Chloride: 107 mmol/L (ref 98–111)
Creatinine: 1.21 mg/dL — ABNORMAL HIGH (ref 0.44–1.00)
GFR, Estimated: 48 mL/min — ABNORMAL LOW (ref >60)
Glucose, Bld: 103 mg/dL — ABNORMAL HIGH (ref 70–99)
Potassium: 4.7 mmol/L (ref 3.5–5.1)
Sodium: 141 mmol/L (ref 135–145)
Total Bilirubin: 0.4 mg/dL (ref 0.3–1.2)
Total Protein: 6.5 g/dL (ref 6.5–8.1)

## 2023-01-27 LAB — FERRITIN: Ferritin: 47 ng/mL (ref 11–307)

## 2023-03-22 DIAGNOSIS — N3946 Mixed incontinence: Secondary | ICD-10-CM | POA: Diagnosis not present

## 2023-03-22 DIAGNOSIS — N3942 Incontinence without sensory awareness: Secondary | ICD-10-CM | POA: Diagnosis not present

## 2023-03-30 ENCOUNTER — Encounter: Payer: Self-pay | Admitting: Cardiology

## 2023-03-30 ENCOUNTER — Ambulatory Visit: Payer: PPO | Attending: Cardiology | Admitting: Cardiology

## 2023-03-30 VITALS — BP 120/70 | HR 62 | Ht 59.0 in | Wt 167.7 lb

## 2023-03-30 DIAGNOSIS — I491 Atrial premature depolarization: Secondary | ICD-10-CM | POA: Diagnosis not present

## 2023-03-30 DIAGNOSIS — I471 Supraventricular tachycardia, unspecified: Secondary | ICD-10-CM

## 2023-03-30 DIAGNOSIS — E669 Obesity, unspecified: Secondary | ICD-10-CM

## 2023-03-30 MED ORDER — METOPROLOL SUCCINATE ER 50 MG PO TB24: ORAL_TABLET | ORAL | 1 refills | Status: DC

## 2023-03-30 NOTE — Progress Notes (Unsigned)
Cardiology Office Note:    Date:  04/02/2023   ID:  Shelby Harrington, DOB 1951/05/14, MRN [id number]  PCP:  Laurann Montana, MD  Cardiologist:  Thomasene Ripple, DO  Electrophysiologist:  None   Referring MD: Laurann Montana, MD      History of Present Illness:    Shelby Harrington is a 72 y.o. female with a hx of  hypertension,CKD stage 3, paroxysmal SVT seen on her recent monitor and rare symptomatic premature atrial complexes.   She reports a week-long episode of palpitations two months ago, which was severe enough to wake her up at night and leave her feeling tired, as if she had been working all night. The palpitations were also noticeable during the day. The patient denies any episodes of syncope. She has been taking metoprolol 25mg  twice daily for close to a year, which initially helped with the palpitations. However, the palpitations have become more forceful recently, even when bearing down, a technique that used to help.  The patient also reports leg swelling, which has been present for several years but has recently increased in a specific area. The swelling is more prominent in one leg and has extended down to the heel. She denies any changes in diet or life circumstances, and has lost five pounds recently. She also reports a recent bout of COVID-19, which was not severe enough to require hospitalization. She has received the initial COVID-19 vaccination but no boosters.  The patient is retired and reports watching television late into the night, often going to bed around midnight. She expresses concern about potential alcohol consumption during an upcoming cruise, but clarifies that she is not a heavy drinker and would likely only have a glass of wine with dinner.  Past Medical History:  Diagnosis Date   Anxiety    Chronic kidney disease    stage 3 per patient   Dysrhythmia    PSVT, PAC per heart monitor 03/2022   Family history of malignant neoplasm of breast    Family history  of uterine cancer    Hemochromatosis associated with mutation in HFE gene (HCC) 01/25/2016   Hypertension     Past Surgical History:  Procedure Laterality Date   BREAST EXCISIONAL BIOPSY Right 1983   Benign   CYSTOSCOPY WITH INJECTION N/A 11/01/2022   Procedure: CYSTOSCOPY WITH INJECTION BULKAMID;  Surgeon: Alfredo Martinez, MD;  Location: [facility] [place];  Service: Urology;  Laterality: N/A;   DILATATION & CURETTAGE/HYSTEROSCOPY WITH MYOSURE N/A 11/26/2021   Procedure: DILATATION & CURETTAGE/HYSTEROSCOPY WITH MYOSURE;  Surgeon: Steva Ready, DO;  Location: [facility] OR;  Service: Gynecology;  Laterality: N/A;  rep will be here.  Confirmed on 05/15 CS   WRIST SURGERY     ganglion removal x 2 as a teenager    Current Medications: Current Meds  Medication Sig   acetaminophen (TYLENOL) 500 MG tablet Take 500 mg by mouth every 6 (six) hours as needed for moderate pain.   ALPRAZolam (XANAX) 0.25 MG tablet Take 0.25 mg by mouth 2 (two) times daily as needed for anxiety.   amLODipine (NORVASC) 5 MG tablet Take 5 mg by mouth daily.   Ascorbic Acid (VITAMIN C) 1000 MG tablet Take 1,000 mg by mouth daily.   aspirin 81 MG tablet Take 81 mg by mouth daily.   atorvastatin (LIPITOR) 10 MG tablet Take 10 mg by mouth at bedtime.   betamethasone valerate (VALISONE) 0.1 % cream Apply 1 application. topically 2 (two) times daily as needed (rash).  CALCIUM PO Take 1,200 mg by mouth daily.   Cetirizine HCl (ZYRTEC ALLERGY) 10 MG CAPS One at bedtime   Cholecalciferol (DIALYVITE VITAMIN D 5000) 125 MCG (5000 UT) capsule Take 5,000 Units by mouth daily.   famotidine (PEPCID) 20 MG tablet Take 20 mg by mouth daily as needed for heartburn or indigestion.   fluticasone (FLONASE) 50 MCG/ACT nasal spray Place 2 sprays into both nostrils daily.   Glucosamine-Chondroitin (GLUCOSAMINE CHONDR COMPLEX PO) Take 1 capsule by mouth daily as needed (rash).   losartan (COZAAR) 25 MG tablet Take 12.5 mg by mouth  daily.   Magnesium 250 MG TABS Take 250 mg by mouth daily.   metoprolol succinate (TOPROL XL) 50 MG 24 hr tablet Take with or immediately following a meal.Take 25 mg (half tablet) in the morning and take 50 mg (one tablet) at night.   Multiple Vitamins-Minerals (CENTRUM SILVER PO) Take 1 tablet by mouth daily.   traZODone (DESYREL) 50 MG tablet Take 25-50 mg by mouth at bedtime.   [DISCONTINUED] metoprolol succinate (TOPROL-XL) 25 MG 24 hr tablet Take 1 tablet (25 mg total) by mouth in the morning and at bedtime. Take 25 mg (one tablet) in the morning and 12.5 mg (half tablet) in the evening.     Allergies:   Prednisone, Latex, Clindamycin/lincomycin, Codeine, Penicillins, and Tape   Social History   Socioeconomic History   Marital status: Married    Spouse name: Not on file   Number of children: Not on file   Years of education: Not on file   Highest education level: Not on file  Occupational History   Not on file  Tobacco Use   Smoking status: Never   Smokeless tobacco: Never  Vaping Use   Vaping status: Never Used  Substance and Sexual Activity   Alcohol use: Yes    Alcohol/week: 1.0 standard drink of alcohol    Types: 1 Glasses of wine per week    Comment: glass of wine on the weekends   Drug use: No   Sexual activity: Not on file  Other Topics Concern   Not on file  Social History Narrative   Not on file   Social Determinants of Health   Financial Resource Strain: Not on file  Food Insecurity: Not on file  Transportation Needs: Not on file  Physical Activity: Not on file  Stress: Not on file  Social Connections: Not on file     Family History: The patient's family history includes Breast cancer (age of onset: 14) in her mother; Diabetes in her father; Emphysema in her mother; Hypertension in her father and mother; Skin cancer in her father; Uterine cancer (age of onset: 85) in her sister.  ROS:   Review of Systems  Constitution: Negative for decreased  appetite, fever and weight gain.  HENT: Negative for congestion, ear discharge, hoarse voice and sore throat.   Eyes: Negative for discharge, redness, vision loss in right eye and visual halos.  Cardiovascular: Negative for chest pain, dyspnea on exertion, leg swelling, orthopnea and palpitations.  Respiratory: Negative for cough, hemoptysis, shortness of breath and snoring.   Endocrine: Negative for heat intolerance and polyphagia.  Hematologic/Lymphatic: Negative for bleeding problem. Does not bruise/bleed easily.  Skin: Negative for flushing, nail changes, rash and suspicious lesions.  Musculoskeletal: Negative for arthritis, joint pain, muscle cramps, myalgias, neck pain and stiffness.  Gastrointestinal: Negative for abdominal pain, bowel incontinence, diarrhea and excessive appetite.  Genitourinary: Negative for decreased libido, genital sores and incomplete  emptying.  Neurological: Negative for brief paralysis, focal weakness, headaches and loss of balance.  Psychiatric/Behavioral: Negative for altered mental status, depression and suicidal ideas.  Allergic/Immunologic: Negative for HIV exposure and persistent infections.    EKGs/Labs/Other Studies Reviewed:    The following studies were reviewed today:   EKG:  The ekg ordered today demonstrates   Recent Labs: 01/27/2023: ALT 21; BUN 13; Creatinine 1.21; Hemoglobin 16.3; Platelet Count 195; Potassium 4.7; Sodium 141  Recent Lipid Panel No results found for: "CHOL", "TRIG", "HDL", "CHOLHDL", "VLDL", "LDLCALC", "LDLDIRECT"  Physical Exam:    VS:  BP 120/70 (BP Location: Left Arm, Patient Position: Sitting, Cuff Size: Normal)   Pulse 62   Ht 4\' 11"  (1.499 m)   Wt 167 lb 11.2 oz (76.1 kg)   SpO2 98%   BMI 33.87 kg/m     Wt Readings from Last 3 Encounters:  03/30/23 167 lb 11.2 oz (76.1 kg)  11/01/22 169 lb 6.4 oz (76.8 kg)  04/29/22 164 lb 1.9 oz (74.4 kg)     GEN: Well nourished, well developed in no acute  distress HEENT: Normal NECK: No JVD; No carotid bruits LYMPHATICS: No lymphadenopathy CARDIAC: S1S2 noted,RRR, no murmurs, rubs, gallops RESPIRATORY:  Clear to auscultation without rales, wheezing or rhonchi  ABDOMEN: Soft, non-tender, non-distended, +bowel sounds, no guarding. EXTREMITIES: No edema, No cyanosis, no clubbing MUSCULOSKELETAL:  No deformity  SKIN: Warm and dry NEUROLOGIC:  Alert and oriented x 3, non-focal PSYCHIATRIC:  Normal affect, good insight  ASSESSMENT:    1. PSVT (paroxysmal supraventricular tachycardia)   2. PAC (premature atrial contraction)   3. Morbid obesity due to excess calories (HCC)   4. Obesity (BMI 30-39.9)    PLAN:    PSVT PAC Increased frequency and intensity of palpitations, particularly at night, despite Metoprolol 25mg  BID. No associated syncope or pre-syncope. Episodes have been self-limiting and have not required hospitalization. -Increase Metoprolol to 50mg  at night and 25mg  in the morning. -Schedule a virtual follow-up visit to assess response to medication adjustment.  Lower Extremity Edema Chronic lower extremity edema, worse on the right, likely secondary to venous insufficiency. No significant fluid overload on physical examination. Kidney function is preserved. -Advise leg elevation and use of compression stockings. -Avoid diuretic therapy at this time to preserve kidney function.  General Health Maintenance -Continue current lipid management as LDL is well-controlled. -Advise moderate alcohol consumption (one glass of wine with dinner) during upcoming vacation. If palpitations increase with alcohol, consider as-needed Lopressor for the trip.  Obesity - lifestyle modification is advised  The patient is in agreement with the above plan. The patient left the office in stable condition.  The patient will follow up in   Medication Adjustments/Labs and Tests Ordered: Current medicines are reviewed at length with the patient today.   Concerns regarding medicines are outlined above.  No orders of the defined types were placed in this encounter.  Meds ordered this encounter  Medications   metoprolol succinate (TOPROL XL) 50 MG 24 hr tablet    Sig: Take with or immediately following a meal.Take 25 mg (half tablet) in the morning and take 50 mg (one tablet) at night.    Dispense:  135 tablet    Refill:  1    Patient Instructions  Medication Instructions:  Your physician has recommended you make the following change in your medication:   INCREASE: Metoprolol succinate (Toprol-XL) 25 mg (half tablet) in the morning and 50 mg (one tablet) at night. + *If you  need a refill on your cardiac medications before your next appointment, please call your pharmacy*   Lab Work: None   Testing/Procedures: None   Follow-Up: At [facility], you and your health needs are our priority.  As part of our continuing mission to provide you with exceptional heart care, we have created designated Provider Care Teams.  These Care Teams include your primary Cardiologist (physician) and Advanced Practice Providers (APPs -  Physician Assistants and Nurse Practitioners) who all work together to provide you with the care you need, when you need it.   Your next appointment:   16 week(s) via MyChart - okay to [employer] [facility]   Provider:   Thomasene Ripple, DO      Adopting a Healthy Lifestyle.  Know what a healthy weight is for you (roughly BMI <25) and aim to maintain this   Aim for 7+ servings of fruits and vegetables daily   65-80+ fluid ounces of water or unsweet tea for healthy kidneys   Limit to max 1 drink of alcohol per day; avoid smoking/tobacco   Limit animal fats in diet for cholesterol and heart health - choose grass fed whenever available   Avoid highly processed foods, and foods high in saturated/trans fats   Aim for low stress - take time to unwind and care for your mental health   Aim for 150 min of moderate  intensity exercise weekly for heart health, and weights twice weekly for bone health   Aim for 7-9 hours of sleep daily   When it comes to diets, agreement about the perfect plan isnt easy to find, even among the experts. Experts at the [facility] of [employer] developed an idea known as the Healthy Eating Plate. Just imagine a plate divided into logical, healthy portions.   The emphasis is on diet quality:   Load up on vegetables and fruits - one-half of your plate: Aim for color and variety, and remember that potatoes dont count.   Go for whole grains - one-quarter of your plate: Whole wheat, barley, wheat berries, quinoa, oats, brown rice, and foods made with them. If you want pasta, go with whole wheat pasta.   Protein power - one-quarter of your plate: Fish, chicken, beans, and nuts are all healthy, versatile protein sources. Limit red meat.   The diet, however, does go beyond the plate, offering a few other suggestions.   Use healthy plant oils, such as olive, canola, soy, corn, sunflower and peanut. Check the labels, and avoid partially hydrogenated oil, which have unhealthy trans fats.   If youre thirsty, drink water. Coffee and tea are good in moderation, but skip sugary drinks and limit milk and dairy products to one or two daily servings.   The type of carbohydrate in the diet is more important than the amount. Some sources of carbohydrates, such as vegetables, fruits, whole grains, and beans-are healthier than others.   Finally, stay active  Signed, Thomasene Ripple, DO  04/02/2023 8:08 PM    [facility]

## 2023-03-30 NOTE — Patient Instructions (Addendum)
Medication Instructions:  Your physician has recommended you make the following change in your medication:   INCREASE: Metoprolol succinate (Toprol-XL) 25 mg (half tablet) in the morning and 50 mg (one tablet) at night. + *If you need a refill on your cardiac medications before your next appointment, please call your pharmacy*   Lab Work: None   Testing/Procedures: None   Follow-Up: At [facility], you and your health needs are our priority.  As part of our continuing mission to provide you with exceptional heart care, we have created designated Provider Care Teams.  These Care Teams include your primary Cardiologist (physician) and Advanced Practice Providers (APPs -  Physician Assistants and Nurse Practitioners) who all work together to provide you with the care you need, when you need it.   Your next appointment:   16 week(s) via MyChart - okay to [employer]   Provider:   Thomasene Ripple, DO

## 2023-04-28 ENCOUNTER — Inpatient Hospital Stay: Payer: PPO | Admitting: Medical Oncology

## 2023-04-28 ENCOUNTER — Inpatient Hospital Stay: Payer: PPO

## 2023-05-02 ENCOUNTER — Inpatient Hospital Stay: Payer: PPO | Attending: Hematology & Oncology

## 2023-05-02 ENCOUNTER — Inpatient Hospital Stay: Payer: PPO | Admitting: Medical Oncology

## 2023-05-02 ENCOUNTER — Other Ambulatory Visit: Payer: Self-pay

## 2023-05-02 ENCOUNTER — Encounter: Payer: Self-pay | Admitting: Medical Oncology

## 2023-05-02 DIAGNOSIS — Z881 Allergy status to other antibiotic agents status: Secondary | ICD-10-CM | POA: Diagnosis not present

## 2023-05-02 DIAGNOSIS — G629 Polyneuropathy, unspecified: Secondary | ICD-10-CM | POA: Diagnosis not present

## 2023-05-02 DIAGNOSIS — Z888 Allergy status to other drugs, medicaments and biological substances status: Secondary | ICD-10-CM | POA: Diagnosis not present

## 2023-05-02 DIAGNOSIS — Z885 Allergy status to narcotic agent status: Secondary | ICD-10-CM | POA: Diagnosis not present

## 2023-05-02 DIAGNOSIS — R002 Palpitations: Secondary | ICD-10-CM | POA: Diagnosis not present

## 2023-05-02 DIAGNOSIS — Z880 Allergy status to penicillin: Secondary | ICD-10-CM | POA: Insufficient documentation

## 2023-05-02 DIAGNOSIS — Z79899 Other long term (current) drug therapy: Secondary | ICD-10-CM | POA: Diagnosis not present

## 2023-05-02 LAB — CBC WITH DIFFERENTIAL (CANCER CENTER ONLY)
Abs Immature Granulocytes: 0.02 10*3/uL (ref 0.00–0.07)
Basophils Absolute: 0.1 10*3/uL (ref 0.0–0.1)
Basophils Relative: 1 %
Eosinophils Absolute: 0.3 10*3/uL (ref 0.0–0.5)
Eosinophils Relative: 3 %
HCT: 48 % — ABNORMAL HIGH (ref 36.0–46.0)
Hemoglobin: 16.3 g/dL — ABNORMAL HIGH (ref 12.0–15.0)
Immature Granulocytes: 0 %
Lymphocytes Relative: 31 %
Lymphs Abs: 2.6 10*3/uL (ref 0.7–4.0)
MCH: 31.2 pg (ref 26.0–34.0)
MCHC: 34 g/dL (ref 30.0–36.0)
MCV: 92 fL (ref 80.0–100.0)
Monocytes Absolute: 0.8 10*3/uL (ref 0.1–1.0)
Monocytes Relative: 9 %
Neutro Abs: 4.6 10*3/uL (ref 1.7–7.7)
Neutrophils Relative %: 56 %
Platelet Count: 202 10*3/uL (ref 150–400)
RBC: 5.22 MIL/uL — ABNORMAL HIGH (ref 3.87–5.11)
RDW: 12.5 % (ref 11.5–15.5)
WBC Count: 8.3 10*3/uL (ref 4.0–10.5)
nRBC: 0 % (ref 0.0–0.2)

## 2023-05-02 LAB — CMP (CANCER CENTER ONLY)
ALT: 26 U/L (ref 0–44)
AST: 28 U/L (ref 15–41)
Albumin: 4.1 g/dL (ref 3.5–5.0)
Alkaline Phosphatase: 96 U/L (ref 38–126)
Anion gap: 11 (ref 5–15)
BUN: 16 mg/dL (ref 8–23)
CO2: 25 mmol/L (ref 22–32)
Calcium: 9.3 mg/dL (ref 8.9–10.3)
Chloride: 106 mmol/L (ref 98–111)
Creatinine: 0.98 mg/dL (ref 0.44–1.00)
GFR, Estimated: >60 mL/min (ref >60)
Glucose, Bld: 99 mg/dL (ref 70–99)
Potassium: 4.1 mmol/L (ref 3.5–5.1)
Sodium: 142 mmol/L (ref 135–145)
Total Bilirubin: 0.7 mg/dL (ref 0.3–1.2)
Total Protein: 6.8 g/dL (ref 6.5–8.1)

## 2023-05-02 LAB — FERRITIN: Ferritin: 51 ng/mL (ref 11–307)

## 2023-05-02 NOTE — Progress Notes (Signed)
Hematology and Oncology Follow Up Visit  Shelby Harrington [id number] October 23, 1950 71 y.o. 05/02/2023   Principle Diagnosis:  Hemochromatosis - Heterozygous H63D mutation   Current Therapy:        Aspirin 81 mg PO daily Phlebotomy to keep ferritin < 50 and iron sat < 30%    Interim History:  Shelby Harrington is here today for follow-up.   Today she reports that she is doing well.  Her cardiologist has increased her night time metoprolol for palpitations.  Chronic fatigue No blood loss, bruising or petechiae.  No fever, chills, n/v, cough, rash, dizziness, SOB, chest pain, palpitations, abdominal pain or changes in bowel habits.  No swelling in her extremities at this time.  Neuropathy in her hands and feet waxes and wanes.  No falls or syncope reported.  Appetite and hydration are good.  Wt Readings from Last 3 Encounters:  05/02/23 169 lb (76.7 kg)  03/30/23 167 lb 11.2 oz (76.1 kg)  11/01/22 169 lb 6.4 oz (76.8 kg)     ECOG Performance Status: 1 - Symptomatic but completely ambulatory  Medications:  Allergies as of 05/02/2023       Reactions   Prednisone Palpitations   Clindamycin/lincomycin Diarrhea   Latex Other (See Comments)   Skin blisters   Codeine Rash   Penicillins Rash   Tape Rash   Adhesive tape        Medication List        Accurate as of May 02, 2023  3:24 PM. If you have any questions, ask your nurse or doctor.          acetaminophen 500 MG tablet Commonly known as: TYLENOL Take 500 mg by mouth every 6 (six) hours as needed for moderate pain.   ALPRAZolam 0.25 MG tablet Commonly known as: XANAX Take 0.25 mg by mouth 2 (two) times daily as needed for anxiety.   amLODipine 5 MG tablet Commonly known as: NORVASC Take 5 mg by mouth daily.   aspirin 81 MG tablet Take 81 mg by mouth daily.   atorvastatin 10 MG tablet Commonly known as: LIPITOR Take 10 mg by mouth at bedtime.   betamethasone valerate 0.1 % cream Commonly known as:  VALISONE Apply 1 application. topically 2 (two) times daily as needed (rash).   CALCIUM PO Take 1,200 mg by mouth daily.   CENTRUM SILVER PO Take 1 tablet by mouth daily.   Cetirizine HCl 10 MG Caps Commonly known as: ZyrTEC Allergy One at bedtime   Dialyvite Vitamin D 5000 125 MCG (5000 UT) capsule Generic drug: Cholecalciferol Take 5,000 Units by mouth daily.   famotidine 20 MG tablet Commonly known as: PEPCID Take 20 mg by mouth daily as needed for heartburn or indigestion.   fluticasone 50 MCG/ACT nasal spray Commonly known as: FLONASE Place 2 sprays into both nostrils daily.   GLUCOSAMINE CHONDR COMPLEX PO Take 1 capsule by mouth daily as needed (rash).   losartan 25 MG tablet Commonly known as: COZAAR Take 12.5 mg by mouth daily.   Magnesium 250 MG Tabs Take 250 mg by mouth daily.   metoprolol succinate 50 MG 24 hr tablet Commonly known as: Toprol XL Take with or immediately following a meal.Take 25 mg (half tablet) in the morning and take 50 mg (one tablet) at night.   traZODone 50 MG tablet Commonly known as: DESYREL Take 25-50 mg by mouth at bedtime.   vitamin C 1000 MG tablet Take 1,000 mg by mouth daily.  Allergies:  Allergies  Allergen Reactions   Prednisone Palpitations   Clindamycin/Lincomycin Diarrhea   Latex Other (See Comments)    Skin blisters   Codeine Rash   Penicillins Rash   Tape Rash    Adhesive tape    Past Medical History, Surgical history, Social history, and Family History were reviewed and updated.  Review of Systems: All other 10 point review of systems is negative.   Physical Exam:  height is 4\' 11"  (1.499 m) and weight is 169 lb (76.7 kg). Her oral temperature is 98.4 F (36.9 C). Her blood pressure is 114/69 and her pulse is 55 (abnormal). Her respiration is 18 and oxygen saturation is 100%.   Wt Readings from Last 3 Encounters:  05/02/23 169 lb (76.7 kg)  03/30/23 167 lb 11.2 oz (76.1 kg)  11/01/22 169  lb 6.4 oz (76.8 kg)    Ocular: Sclerae unicteric, pupils equal, round and reactive to light Ear-nose-throat: Oropharynx clear, dentition fair Lymphatic: No cervical or supraclavicular adenopathy Lungs no rales or rhonchi, good excursion bilaterally Heart regular rate and rhythm, no murmur appreciated Abd soft, nontender, positive bowel sounds MSK no focal spinal tenderness, no joint edema Neuro: non-focal, well-oriented, appropriate affect  Lab Results  Component Value Date   WBC 8.3 05/02/2023   HGB 16.3 (H) 05/02/2023   HCT 48.0 (H) 05/02/2023   MCV 92.0 05/02/2023   PLT 202 05/02/2023   Lab Results  Component Value Date   FERRITIN 47 01/27/2023   IRON 72 01/27/2023   TIBC 371 01/27/2023   UIBC 299 01/27/2023   IRONPCTSAT 19 01/27/2023   Lab Results  Component Value Date   RBC 5.22 (H) 05/02/2023   No results found for: "KPAFRELGTCHN", "LAMBDASER", "KAPLAMBRATIO" No results found for: "IGGSERUM", "IGA", "IGMSERUM" No results found for: "TOTALPROTELP", "ALBUMINELP", "A1GS", "A2GS", "BETS", "BETA2SER", "GAMS", "MSPIKE", "SPEI"   Chemistry      Component Value Date/Time   NA 141 01/27/2023 1312   NA 142 09/14/2021 0921   NA 148 (H) 06/29/2017 0813   NA 143 03/30/2017 0805   K 4.7 01/27/2023 1312   K 3.8 06/29/2017 0813   K 4.0 03/30/2017 0805   CL 107 01/27/2023 1312   CL 105 06/29/2017 0813   CO2 27 01/27/2023 1312   CO2 28 06/29/2017 0813   CO2 26 03/30/2017 0805   BUN 13 01/27/2023 1312   BUN 16 09/14/2021 0921   BUN 21 06/29/2017 0813   BUN 18.2 03/30/2017 0805   CREATININE 1.21 (H) 01/27/2023 1312   CREATININE 1.3 (H) 06/29/2017 0813   CREATININE 1.0 03/30/2017 0805      Component Value Date/Time   CALCIUM 9.5 01/27/2023 1312   CALCIUM 9.5 06/29/2017 0813   CALCIUM 9.6 03/30/2017 0805   ALKPHOS 89 01/27/2023 1312   ALKPHOS 78 06/29/2017 0813   ALKPHOS 73 03/30/2017 0805   AST 26 01/27/2023 1312   AST 29 03/30/2017 0805   ALT 21 01/27/2023 1312    ALT 32 06/29/2017 0813   ALT 26 03/30/2017 0805   BILITOT 0.4 01/27/2023 1312   BILITOT 0.67 03/30/2017 0805     Encounter Diagnosis  Name Primary?   Hemochromatosis associated with mutation in HFE gene [facility]) Yes   Impression and Plan: Shelby Harrington is a very pleasant 72 yo caucasian female with hemochromatosis, heterozygous for the H63D mutation.   Today CBC is stable.  Iron studies are pending. We will set her up for phlebotomy if needed.  RTC 3 months labs(CBC,  iron, ferritin, TSH, CMP) RTC 6 months, labs (CBC, CMP, iron, ferritin )  Rushie Chestnut, PA-C 10/29/20243:24 PM

## 2023-05-03 LAB — IRON AND IRON BINDING CAPACITY (CC-WL,HP ONLY)
Iron: 119 ug/dL (ref 28–170)
Saturation Ratios: 31 % (ref 10.4–31.8)
TIBC: 391 ug/dL (ref 250–450)
UIBC: 272 ug/dL (ref 148–442)

## 2023-05-09 DIAGNOSIS — Z87898 Personal history of other specified conditions: Secondary | ICD-10-CM | POA: Diagnosis not present

## 2023-05-09 DIAGNOSIS — I700 Atherosclerosis of aorta: Secondary | ICD-10-CM | POA: Diagnosis not present

## 2023-05-09 DIAGNOSIS — F5101 Primary insomnia: Secondary | ICD-10-CM | POA: Diagnosis not present

## 2023-05-09 DIAGNOSIS — N183 Chronic kidney disease, stage 3 unspecified: Secondary | ICD-10-CM | POA: Diagnosis not present

## 2023-05-09 DIAGNOSIS — F419 Anxiety disorder, unspecified: Secondary | ICD-10-CM | POA: Diagnosis not present

## 2023-05-09 DIAGNOSIS — I471 Supraventricular tachycardia, unspecified: Secondary | ICD-10-CM | POA: Diagnosis not present

## 2023-05-09 DIAGNOSIS — E669 Obesity, unspecified: Secondary | ICD-10-CM | POA: Diagnosis not present

## 2023-05-09 DIAGNOSIS — I129 Hypertensive chronic kidney disease with stage 1 through stage 4 chronic kidney disease, or unspecified chronic kidney disease: Secondary | ICD-10-CM | POA: Diagnosis not present

## 2023-05-12 ENCOUNTER — Inpatient Hospital Stay: Payer: PPO | Attending: Hematology & Oncology

## 2023-05-12 DIAGNOSIS — Z79899 Other long term (current) drug therapy: Secondary | ICD-10-CM | POA: Insufficient documentation

## 2023-05-12 MED ORDER — SODIUM CHLORIDE 0.9 % IV SOLN: Freq: Once | INTRAVENOUS | Status: AC

## 2023-05-12 NOTE — Progress Notes (Signed)
Shelby Harrington presents today for phlebotomy per MD orders. Phlebotomy procedure started at 1050 via 20 g angio cath to right ac and ended at 1131. 444 grams removed.  500 ml.'s of IV NS given after phlebotomy.  Patient observed for 30 minutes after procedure without any incident. Patient tolerated procedure well. IV needle removed intact.

## 2023-05-30 ENCOUNTER — Other Ambulatory Visit: Payer: Self-pay | Admitting: Family Medicine

## 2023-05-30 DIAGNOSIS — Z1231 Encounter for screening mammogram for malignant neoplasm of breast: Secondary | ICD-10-CM

## 2023-07-04 ENCOUNTER — Ambulatory Visit
Admission: RE | Admit: 2023-07-04 | Discharge: 2023-07-04 | Disposition: A | Discharge status: Home / Self Care | Payer: PPO | Source: Ambulatory Visit | Attending: Family Medicine | Admitting: Family Medicine

## 2023-07-04 DIAGNOSIS — Z1231 Encounter for screening mammogram for malignant neoplasm of breast: Secondary | ICD-10-CM | POA: Diagnosis not present

## 2023-07-17 ENCOUNTER — Encounter: Payer: Self-pay | Admitting: Cardiology

## 2023-07-17 ENCOUNTER — Ambulatory Visit: Payer: PPO | Attending: Cardiology | Admitting: Cardiology

## 2023-07-17 ENCOUNTER — Ambulatory Visit (INDEPENDENT_AMBULATORY_CARE_PROVIDER_SITE_OTHER): Payer: PPO

## 2023-07-17 VITALS — BP 130/80 | HR 48 | Ht 59.0 in | Wt 172.4 lb

## 2023-07-17 DIAGNOSIS — I491 Atrial premature depolarization: Secondary | ICD-10-CM

## 2023-07-17 DIAGNOSIS — R002 Palpitations: Secondary | ICD-10-CM

## 2023-07-17 DIAGNOSIS — I471 Supraventricular tachycardia, unspecified: Secondary | ICD-10-CM | POA: Diagnosis not present

## 2023-07-17 NOTE — Progress Notes (Signed)
 Cardiology Office Note:    Date:  07/17/2023   ID:  Shelby Harrington, DOB [birth year], MRN [id number]  PCP:  Teresa Channel, MD  Cardiologist:  Dub Huntsman, DO  Electrophysiologist:  None   Referring MD: Teresa Channel, MD    I had some chest tightness   History of Present Illness:    Shelby Harrington is a 73 y.o. female with a hx of  hypertension,CKD stage 3, paroxysmal SVT seen on her recent monitor and rare symptomatic premature atrial complexes.   Since her last visit with me which was on March 30, 2023 she tells me she has been doing well.  During that visit I increased her metoprolol  to 50 mg at nighttime and 20 5 in the morning.  She states that she has been doing well with this until recently she feels like she has been having some chest tightness.  She reports  two episodes of chest pain two weeks ago. The pain was described as 'real' and lasted for three to four seconds, which was different from the usual 'stabby' pains. The episodes have not recurred since.  In addition, the patient has been experiencing a cough for about a month, which is usually attributed to allergies. Recently, she has started to feel some congestion. She is currently using Flonase for her allergies.   Past Medical History:  Diagnosis Date   Anxiety    Chronic kidney disease    stage 3 per patient   Dysrhythmia    PSVT, PAC per heart monitor 03/2022   Family history of malignant neoplasm of breast    Family history of uterine cancer    Hemochromatosis associated with mutation in HFE gene (HCC) 01/25/2016   Hypertension     Past Surgical History:  Procedure Laterality Date   BREAST EXCISIONAL BIOPSY Right 1983   Benign   CYSTOSCOPY WITH INJECTION N/A 11/01/2022   Procedure: CYSTOSCOPY WITH INJECTION BULKAMID;  Surgeon: Gaston Hamilton, MD;  Location: [facility] [place];  Service: Urology;  Laterality: N/A;   DILATATION & CURETTAGE/HYSTEROSCOPY WITH MYOSURE N/A 11/26/2021    Procedure: DILATATION & CURETTAGE/HYSTEROSCOPY WITH MYOSURE;  Surgeon: Storm Setter, DO;  Location: [facility] OR;  Service: Gynecology;  Laterality: N/A;  rep will be here.  Confirmed on 05/15 CS   WRIST SURGERY     ganglion removal x 2 as a teenager    Current Medications: Current Meds  Medication Sig   acetaminophen  (TYLENOL ) 500 MG tablet Take 500 mg by mouth every 6 (six) hours as needed for moderate pain.   ALPRAZolam (XANAX) 0.25 MG tablet Take 0.25 mg by mouth 2 (two) times daily as needed for anxiety.   amLODipine (NORVASC) 5 MG tablet Take 5 mg by mouth daily.   Ascorbic Acid (VITAMIN C) 1000 MG tablet Take 1,000 mg by mouth daily.   aspirin 81 MG tablet Take 81 mg by mouth daily.   atorvastatin (LIPITOR) 10 MG tablet Take 10 mg by mouth at bedtime.   betamethasone valerate (VALISONE) 0.1 % cream Apply 1 application. topically 2 (two) times daily as needed (rash).   CALCIUM PO Take 1,200 mg by mouth daily.   Cetirizine  HCl (ZYRTEC  ALLERGY ) 10 MG CAPS One at bedtime   Cholecalciferol (DIALYVITE VITAMIN D 5000) 125 MCG (5000 UT) capsule Take 5,000 Units by mouth daily.   famotidine (PEPCID) 20 MG tablet Take 20 mg by mouth daily as needed for heartburn or indigestion.   fluticasone (FLONASE) 50 MCG/ACT nasal spray Place 2 sprays  into both nostrils daily.   Glucosamine-Chondroitin (GLUCOSAMINE CHONDR COMPLEX PO) Take 1 capsule by mouth daily as needed (rash).   losartan (COZAAR) 25 MG tablet Take 12.5 mg by mouth daily.   Magnesium 250 MG TABS Take 250 mg by mouth daily.   metoprolol  succinate (TOPROL  XL) 50 MG 24 hr tablet Take with or immediately following a meal.Take 25 mg (half tablet) in the morning and take 50 mg (one tablet) at night.   Multiple Vitamins-Minerals (CENTRUM SILVER  PO) Take 1 tablet by mouth daily.   traZODone (DESYREL) 50 MG tablet Take 25-50 mg by mouth at bedtime.     Allergies:   Prednisone, Clindamycin/lincomycin, Latex, Codeine, Penicillins, and Tape    Social History   Socioeconomic History   Marital status: Married    Spouse name: Not on file   Number of children: Not on file   Years of education: Not on file   Highest education level: Not on file  Occupational History   Not on file  Tobacco Use   Smoking status: Never   Smokeless tobacco: Never  Vaping Use   Vaping status: Never Used  Substance and Sexual Activity   Alcohol use: Yes    Alcohol/week: 1.0 standard drink of alcohol    Types: 1 Glasses of wine per week    Comment: glass of wine on the weekends   Drug use: No   Sexual activity: Not on file  Other Topics Concern   Not on file  Social History Narrative   Not on file   Social Drivers of Health   Financial Resource Strain: Not on file  Food Insecurity: Not on file  Transportation Needs: Not on file  Physical Activity: Not on file  Stress: Not on file  Social Connections: Not on file     Family History: The patient's family history includes Breast cancer (age of onset: 24) in her mother; Diabetes in her father; Emphysema in her mother; Hypertension in her father and mother; Skin cancer in her father; Uterine cancer (age of onset: 39) in her sister.  ROS:   Review of Systems  Constitution: Negative for decreased appetite, fever and weight gain.  HENT: Negative for congestion, ear discharge, hoarse voice and sore throat.   Eyes: Negative for discharge, redness, vision loss in right eye and visual halos.  Cardiovascular: Negative for chest pain, dyspnea on exertion, leg swelling, orthopnea and palpitations.  Respiratory: Negative for cough, hemoptysis, shortness of breath and snoring.   Endocrine: Negative for heat intolerance and polyphagia.  Hematologic/Lymphatic: Negative for bleeding problem. Does not bruise/bleed easily.  Skin: Negative for flushing, nail changes, rash and suspicious lesions.  Musculoskeletal: Negative for arthritis, joint pain, muscle cramps, myalgias, neck pain and stiffness.   Gastrointestinal: Negative for abdominal pain, bowel incontinence, diarrhea and excessive appetite.  Genitourinary: Negative for decreased libido, genital sores and incomplete emptying.  Neurological: Negative for brief paralysis, focal weakness, headaches and loss of balance.  Psychiatric/Behavioral: Negative for altered mental status, depression and suicidal ideas.  Allergic/Immunologic: Negative for HIV exposure and persistent infections.    EKGs/Labs/Other Studies Reviewed:    The following studies were reviewed today:   EKG:  The ekg ordered today demonstrates   Recent Labs: 05/02/2023: ALT 26; BUN 16; Creatinine 0.98; Hemoglobin 16.3; Platelet Count 202; Potassium 4.1; Sodium 142  Recent Lipid Panel No results found for: CHOL, TRIG, HDL, CHOLHDL, VLDL, LDLCALC, LDLDIRECT  Physical Exam:    VS:  BP 130/80 (BP Location: Right Arm, Patient Position:  Sitting, Cuff Size: Normal)   Pulse (!) 48   Ht 4' 11 (1.499 m)   Wt 172 lb 6.4 oz (78.2 kg)   SpO2 97%   BMI 34.82 kg/m     Wt Readings from Last 3 Encounters:  07/17/23 172 lb 6.4 oz (78.2 kg)  05/02/23 169 lb (76.7 kg)  03/30/23 167 lb 11.2 oz (76.1 kg)     GEN: Well nourished, well developed in no acute distress HEENT: Normal NECK: No JVD; No carotid bruits LYMPHATICS: No lymphadenopathy CARDIAC: S1S2 noted,RRR, no murmurs, rubs, gallops RESPIRATORY:  Clear to auscultation without rales, wheezing or rhonchi  ABDOMEN: Soft, non-tender, non-distended, +bowel sounds, no guarding. EXTREMITIES: No edema, No cyanosis, no clubbing MUSCULOSKELETAL:  No deformity  SKIN: Warm and dry NEUROLOGIC:  Alert and oriented x 3, non-focal PSYCHIATRIC:  Normal affect, good insight  ASSESSMENT:    1. PSVT (paroxysmal supraventricular tachycardia) (HCC)   2. Palpitations   3. PAC (premature atrial contraction)    PLAN:    PSVT PAC Patient reported episodes of chest pain two weeks ago, lasting for 3-4 seconds. No  recent episodes. Currently on Metoprolol  for PACs and SVTs. Skipped beats noted during physical examination. -Order 14-day heart monitor to assess frequency and severity of PACs and SVTs.  Chronic Cough Patient reported a persistent cough for about a month, possibly due to allergies. No wheezing noted on examination. -Advise continued use of Flonase. -Recommend use of a humidifier at night. -If symptoms persist or worsen, advise patient to contact primary care provider.    Obesity - lifestyle modification is advised  The patient is in agreement with the above plan. The patient left the office in stable condition.  The patient will follow up in   Medication Adjustments/Labs and Tests Ordered: Current medicines are reviewed at length with the patient today.  Concerns regarding medicines are outlined above.  Orders Placed This Encounter  Procedures   LONG TERM MONITOR (3-14 DAYS)   EKG 12-Lead   No orders of the defined types were placed in this encounter.   Patient Instructions  Medication Instructions:  Your physician recommends that you continue on your current medications as directed. Please refer to the Current Medication list given to you today.  *If you need a refill on your cardiac medications before your next appointment, please call your pharmacy*  Testing/Procedures: ZIO XT- Long Term Monitor Instructions  Your physician has requested you wear a ZIO patch monitor for 14 days.  This is a single patch monitor. [employer] supplies one patch monitor per enrollment. Additional stickers are not available. Please do not apply patch if you will be having a Nuclear Stress Test,  Echocardiogram, Cardiac CT, MRI, or Chest Xray during the period you would be wearing the  monitor. The patch cannot be worn during these tests. You cannot remove and re-apply the  ZIO XT patch monitor.  Your ZIO patch monitor will be mailed 3 day USPS to your address on file. It may take 3-5 days  to  receive your monitor after you have been enrolled.  Once you have received your monitor, please review the enclosed instructions. Your monitor  has already been registered assigning a specific monitor serial # to you.  Billing and Patient Assistance Program Information  We have supplied [employer] with any of your insurance information on file for billing purposes. [employer] offers a sliding scale Patient Assistance Program for patients that do not have  insurance, or whose insurance does not completely cover  the cost of the ZIO monitor.  You must apply for the Patient Assistance Program to qualify for this discounted rate.  To apply, please call [employer] at [phone], select option 4, select option 2, ask to apply for  Patient Assistance Program. Meredeth will ask your household income, and how many people  are in your household. They will quote your out-of-pocket cost based on that information.  [employer] will also be able to set up a 80-month, interest-free payment plan if needed.  Applying the monitor   Shave hair from upper left chest.  Hold abrader disc by orange tab. Rub abrader in 40 strokes over the upper left chest as  indicated in your monitor instructions.  Clean area with 4 enclosed alcohol pads. Let dry.  Apply patch as indicated in monitor instructions. Patch will be placed under collarbone on left  side of chest with arrow pointing upward.  Rub patch adhesive wings for 2 minutes. Remove white label marked 1. Remove the white  label marked 2. Rub patch adhesive wings for 2 additional minutes.  While looking in a mirror, press and release button in center of patch. A small green light will  flash 3-4 times. This will be your only indicator that the monitor has been turned on.  Do not shower for the first 24 hours. You may shower after the first 24 hours.  Press the button if you feel a symptom. You will hear a small click. Record Date, Time and  Symptom in the Patient  Logbook.  When you are ready to remove the patch, follow instructions on the last 2 pages of Patient  Logbook. Stick patch monitor onto the last page of Patient Logbook.  Place Patient Logbook in the blue and white box. Use locking tab on box and tape box closed  securely. The blue and white box has prepaid postage on it. Please place it in the mailbox as  soon as possible. Your physician should have your test results approximately 7 days after the  monitor has been mailed back to [facility].  Call [facility] Customer Care at [phone] if you have questions regarding  your ZIO XT patch monitor. Call them immediately if you see an orange light blinking on your  monitor.  If your monitor falls off in less than 4 days, contact our Monitor department at [phone].  If your monitor becomes loose or falls off after 4 days call [employer] at [phone] for  suggestions on securing your monitor    Follow-Up: At [facility], you and your health needs are our priority.  As part of our continuing mission to provide you with exceptional heart care, we have created designated Provider Care Teams.  These Care Teams include your primary Cardiologist (physician) and Advanced Practice Providers (APPs -  Physician Assistants and Nurse Practitioners) who all work together to provide you with the care you need, when you need it.  Your next appointment:   12 month(s)  Provider:   Lattie Riege, DO     Other Instructions       Adopting a Healthy Lifestyle.  Know what a healthy weight is for you (roughly BMI <25) and aim to maintain this   Aim for 7+ servings of fruits and vegetables daily   65-80+ fluid ounces of water  or unsweet tea for healthy kidneys   Limit to max 1 drink of alcohol per day; avoid smoking/tobacco   Limit animal fats in diet for cholesterol and heart health - choose grass  fed whenever available   Avoid highly processed foods, and foods high in  saturated/trans fats   Aim for low stress - take time to unwind and care for your mental health   Aim for 150 min of moderate intensity exercise weekly for heart health, and weights twice weekly for bone health   Aim for 7-9 hours of sleep daily   When it comes to diets, agreement about the perfect plan isnt easy to find, even among the experts. Experts at the [facility] [employer] developed an idea known as the Healthy Eating Plate. Just imagine a plate divided into logical, healthy portions.   The emphasis is on diet quality:   Load up on vegetables and fruits - one-half of your plate: Aim for color and variety, and remember that potatoes dont count.   Go for whole grains - one-quarter of your plate: Whole wheat, barley, wheat berries, quinoa, oats, brown rice, and foods made with them. If you want pasta, go with whole wheat pasta.   Protein power - one-quarter of your plate: Fish, chicken, beans, and nuts are all healthy, versatile protein sources. Limit red meat.   The diet, however, does go beyond the plate, offering a few other suggestions.   Use healthy plant oils, such as olive, canola, soy, corn, sunflower and peanut. Check the labels, and avoid partially hydrogenated oil, which have unhealthy trans fats.   If youre thirsty, drink water . Coffee and tea are good in moderation, but skip sugary drinks and limit milk and dairy products to one or two daily servings.   The type of carbohydrate in the diet is more important than the amount. Some sources of carbohydrates, such as vegetables, fruits, whole grains, and beans-are healthier than others.   Finally, stay active  Signed, Dub Huntsman, DO  07/17/2023 10:34 AM    [facility]

## 2023-07-17 NOTE — Progress Notes (Unsigned)
 Enrolled for [employer] to mail a ZIO XT long term holter monitor to the patients address on file.

## 2023-07-17 NOTE — Patient Instructions (Signed)
 Medication Instructions:  Your physician recommends that you continue on your current medications as directed. Please refer to the Current Medication list given to you today.  *If you need a refill on your cardiac medications before your next appointment, please call your pharmacy*  Testing/Procedures: ZIO XT- Long Term Monitor Instructions  Your physician has requested you wear a ZIO patch monitor for 14 days.  This is a single patch monitor. [employer] supplies one patch monitor per enrollment. Additional stickers are not available. Please do not apply patch if you will be having a Nuclear Stress Test,  Echocardiogram, Cardiac CT, MRI, or Chest Xray during the period you would be wearing the  monitor. The patch cannot be worn during these tests. You cannot remove and re-apply the  ZIO XT patch monitor.  Your ZIO patch monitor will be mailed 3 day USPS to your address on file. It may take 3-5 days  to receive your monitor after you have been enrolled.  Once you have received your monitor, please review the enclosed instructions. Your monitor  has already been registered assigning a specific monitor serial # to you.  Billing and Patient Assistance Program Information  We have supplied [employer] with any of your insurance information on file for billing purposes. [employer] offers a sliding scale Patient Assistance Program for patients that do not have  insurance, or whose insurance does not completely cover the cost of the ZIO monitor.  You must apply for the Patient Assistance Program to qualify for this discounted rate.  To apply, please call [employer] at [phone], select option 4, select option 2, ask to apply for  Patient Assistance Program. Meredeth will ask your household income, and how many people  are in your household. They will quote your out-of-pocket cost based on that information.  [employer] will also be able to set up a 10-month, interest-free payment plan if needed.  Applying the  monitor   Shave hair from upper left chest.  Hold abrader disc by orange tab. Rub abrader in 40 strokes over the upper left chest as  indicated in your monitor instructions.  Clean area with 4 enclosed alcohol pads. Let dry.  Apply patch as indicated in monitor instructions. Patch will be placed under collarbone on left  side of chest with arrow pointing upward.  Rub patch adhesive wings for 2 minutes. Remove white label marked 1. Remove the white  label marked 2. Rub patch adhesive wings for 2 additional minutes.  While looking in a mirror, press and release button in center of patch. A small green light will  flash 3-4 times. This will be your only indicator that the monitor has been turned on.  Do not shower for the first 24 hours. You may shower after the first 24 hours.  Press the button if you feel a symptom. You will hear a small click. Record Date, Time and  Symptom in the Patient Logbook.  When you are ready to remove the patch, follow instructions on the last 2 pages of Patient  Logbook. Stick patch monitor onto the last page of Patient Logbook.  Place Patient Logbook in the blue and white box. Use locking tab on box and tape box closed  securely. The blue and white box has prepaid postage on it. Please place it in the mailbox as  soon as possible. Your physician should have your test results approximately 7 days after the  monitor has been mailed back to [facility].  Call [facility] Customer Care at  [phone] if you have questions regarding  your ZIO XT patch monitor. Call them immediately if you see an orange light blinking on your  monitor.  If your monitor falls off in less than 4 days, contact our Monitor department at [phone].  If your monitor becomes loose or falls off after 4 days call [employer] at [phone] for  suggestions on securing your monitor    Follow-Up: At [facility], you and your health needs are our priority.  As part of  our continuing mission to provide you with exceptional heart care, we have created designated Provider Care Teams.  These Care Teams include your primary Cardiologist (physician) and Advanced Practice Providers (APPs -  Physician Assistants and Nurse Practitioners) who all work together to provide you with the care you need, when you need it.  Your next appointment:   12 month(s)  Provider:   Kardie Tobb, DO     Other Instructions

## 2023-07-21 DIAGNOSIS — I491 Atrial premature depolarization: Secondary | ICD-10-CM

## 2023-07-21 DIAGNOSIS — I471 Supraventricular tachycardia, unspecified: Secondary | ICD-10-CM

## 2023-08-02 ENCOUNTER — Inpatient Hospital Stay: Payer: PPO | Attending: Hematology & Oncology

## 2023-08-02 ENCOUNTER — Inpatient Hospital Stay: Payer: PPO | Admitting: Medical Oncology

## 2023-08-02 DIAGNOSIS — Z79899 Other long term (current) drug therapy: Secondary | ICD-10-CM | POA: Diagnosis not present

## 2023-08-02 LAB — CMP (CANCER CENTER ONLY)
ALT: 19 U/L (ref 0–44)
AST: 21 U/L (ref 15–41)
Albumin: 3.9 g/dL (ref 3.5–5.0)
Alkaline Phosphatase: 81 U/L (ref 38–126)
Anion gap: 6 (ref 5–15)
BUN: 19 mg/dL (ref 8–23)
CO2: 26 mmol/L (ref 22–32)
Calcium: 9 mg/dL (ref 8.9–10.3)
Chloride: 108 mmol/L (ref 98–111)
Creatinine: 0.99 mg/dL (ref 0.44–1.00)
GFR, Estimated: >60 mL/min (ref >60)
Glucose, Bld: 104 mg/dL — ABNORMAL HIGH (ref 70–99)
Potassium: 3.8 mmol/L (ref 3.5–5.1)
Sodium: 140 mmol/L (ref 135–145)
Total Bilirubin: 0.3 mg/dL (ref 0.0–1.2)
Total Protein: 5.9 g/dL — ABNORMAL LOW (ref 6.5–8.1)

## 2023-08-02 LAB — CBC
HCT: 44.9 % (ref 36.0–46.0)
Hemoglobin: 15.3 g/dL — ABNORMAL HIGH (ref 12.0–15.0)
MCH: 31 pg (ref 26.0–34.0)
MCHC: 34.1 g/dL (ref 30.0–36.0)
MCV: 91.1 fL (ref 80.0–100.0)
Platelets: 187 10*3/uL (ref 150–400)
RBC: 4.93 MIL/uL (ref 3.87–5.11)
RDW: 12.7 % (ref 11.5–15.5)
WBC: 7 10*3/uL (ref 4.0–10.5)
nRBC: 0 % (ref 0.0–0.2)

## 2023-08-02 LAB — FERRITIN: Ferritin: 24 ng/mL (ref 11–307)

## 2023-08-03 ENCOUNTER — Encounter: Payer: Self-pay | Admitting: Medical Oncology

## 2023-08-03 LAB — IRON AND IRON BINDING CAPACITY (CC-WL,HP ONLY)
Iron: 97 ug/dL (ref 28–170)
Saturation Ratios: 24 % (ref 10.4–31.8)
TIBC: 410 ug/dL (ref 250–450)
UIBC: 313 ug/dL (ref 148–442)

## 2023-08-07 ENCOUNTER — Encounter: Payer: Self-pay | Admitting: Family

## 2023-08-14 ENCOUNTER — Encounter: Payer: Self-pay | Admitting: Cardiology

## 2023-09-18 DIAGNOSIS — Z6834 Body mass index (BMI) 34.0-34.9, adult: Secondary | ICD-10-CM | POA: Diagnosis not present

## 2023-09-18 DIAGNOSIS — E669 Obesity, unspecified: Secondary | ICD-10-CM | POA: Diagnosis not present

## 2023-10-03 ENCOUNTER — Other Ambulatory Visit: Payer: Self-pay | Admitting: Cardiology

## 2023-10-30 ENCOUNTER — Other Ambulatory Visit: Payer: Self-pay | Admitting: Medical Oncology

## 2023-10-30 ENCOUNTER — Inpatient Hospital Stay: Admitting: Medical Oncology

## 2023-10-30 ENCOUNTER — Inpatient Hospital Stay: Attending: Hematology & Oncology

## 2023-10-30 ENCOUNTER — Encounter: Payer: Self-pay | Admitting: Medical Oncology

## 2023-10-30 DIAGNOSIS — R5383 Other fatigue: Secondary | ICD-10-CM | POA: Diagnosis not present

## 2023-10-30 DIAGNOSIS — Z7982 Long term (current) use of aspirin: Secondary | ICD-10-CM | POA: Insufficient documentation

## 2023-10-30 DIAGNOSIS — Z880 Allergy status to penicillin: Secondary | ICD-10-CM | POA: Diagnosis not present

## 2023-10-30 DIAGNOSIS — Z881 Allergy status to other antibiotic agents status: Secondary | ICD-10-CM | POA: Insufficient documentation

## 2023-10-30 DIAGNOSIS — Z888 Allergy status to other drugs, medicaments and biological substances status: Secondary | ICD-10-CM | POA: Insufficient documentation

## 2023-10-30 DIAGNOSIS — R002 Palpitations: Secondary | ICD-10-CM | POA: Insufficient documentation

## 2023-10-30 DIAGNOSIS — Z885 Allergy status to narcotic agent status: Secondary | ICD-10-CM | POA: Diagnosis not present

## 2023-10-30 DIAGNOSIS — G629 Polyneuropathy, unspecified: Secondary | ICD-10-CM | POA: Insufficient documentation

## 2023-10-30 LAB — CMP (CANCER CENTER ONLY)
ALT: 28 U/L (ref 0–44)
AST: 30 U/L (ref 15–41)
Albumin: 4.3 g/dL (ref 3.5–5.0)
Alkaline Phosphatase: 108 U/L (ref 38–126)
Anion gap: 13 (ref 5–15)
BUN: 19 mg/dL (ref 8–23)
CO2: 23 mmol/L (ref 22–32)
Calcium: 9.6 mg/dL (ref 8.9–10.3)
Chloride: 107 mmol/L (ref 98–111)
Creatinine: 1.03 mg/dL — ABNORMAL HIGH (ref 0.44–1.00)
GFR, Estimated: 58 mL/min — ABNORMAL LOW (ref >60)
Glucose, Bld: 104 mg/dL — ABNORMAL HIGH (ref 70–99)
Potassium: 4.3 mmol/L (ref 3.5–5.1)
Sodium: 143 mmol/L (ref 135–145)
Total Bilirubin: 0.4 mg/dL (ref 0.0–1.2)
Total Protein: 6.3 g/dL — ABNORMAL LOW (ref 6.5–8.1)

## 2023-10-30 LAB — FERRITIN: Ferritin: 65 ng/mL (ref 11–307)

## 2023-10-30 LAB — TSH: TSH: 1.27 u[IU]/mL (ref 0.350–4.500)

## 2023-10-30 LAB — CBC WITH DIFFERENTIAL (CANCER CENTER ONLY)
Abs Immature Granulocytes: 0.02 10*3/uL (ref 0.00–0.07)
Basophils Absolute: 0.1 10*3/uL (ref 0.0–0.1)
Basophils Relative: 1 %
Eosinophils Absolute: 0.5 10*3/uL (ref 0.0–0.5)
Eosinophils Relative: 8 %
HCT: 47.5 % — ABNORMAL HIGH (ref 36.0–46.0)
Hemoglobin: 16.3 g/dL — ABNORMAL HIGH (ref 12.0–15.0)
Immature Granulocytes: 0 %
Lymphocytes Relative: 34 %
Lymphs Abs: 2.2 10*3/uL (ref 0.7–4.0)
MCH: 31.3 pg (ref 26.0–34.0)
MCHC: 34.3 g/dL (ref 30.0–36.0)
MCV: 91.2 fL (ref 80.0–100.0)
Monocytes Absolute: 0.7 10*3/uL (ref 0.1–1.0)
Monocytes Relative: 11 %
Neutro Abs: 3 10*3/uL (ref 1.7–7.7)
Neutrophils Relative %: 46 %
Platelet Count: 202 10*3/uL (ref 150–400)
RBC: 5.21 MIL/uL — ABNORMAL HIGH (ref 3.87–5.11)
RDW: 13.2 % (ref 11.5–15.5)
WBC Count: 6.4 10*3/uL (ref 4.0–10.5)
nRBC: 0 % (ref 0.0–0.2)

## 2023-10-30 LAB — IRON AND IRON BINDING CAPACITY (CC-WL,HP ONLY)
Iron: 171 ug/dL — ABNORMAL HIGH (ref 28–170)
Saturation Ratios: 46 % — ABNORMAL HIGH (ref 10.4–31.8)
TIBC: 375 ug/dL (ref 250–450)
UIBC: 204 ug/dL (ref 148–442)

## 2023-10-30 NOTE — Progress Notes (Signed)
 Hematology and Oncology Follow Up Visit  Shelby Harrington [id number] 1950-09-15 73 y.o. 10/30/2023   Principle Diagnosis:  Hemochromatosis - Heterozygous H63D mutation   Current Therapy:        Aspirin 81 mg PO daily Phlebotomy to keep ferritin < 50 and iron sat < 30%    Interim History:  Shelby Harrington is here today for follow-up.   She reports that she has been well since our last visit. She has no concerns today.  Palpitations are stable. Recently trialing down on amlodipine to see if this helps with her mild peripheral edema. Chronic fatigue She is UTD on eye exams She had an abnormal abdominal US  on 09/29/2016 No blood loss, bruising or petechiae.  No fever, chills, n/v, cough, rash, dizziness, SOB, chest pain, palpitations, abdominal pain or changes in bowel habits.  No swelling in her extremities at this time.  Neuropathy in her hands and feet waxes and wanes.  No falls or syncope reported.  Appetite and hydration are good.  Wt Readings from Last 3 Encounters:  10/30/23 174 lb (78.9 kg)  07/17/23 172 lb 6.4 oz (78.2 kg)  05/02/23 169 lb (76.7 kg)    ECOG Performance Status: 1 - Symptomatic but completely ambulatory  Medications:  Allergies as of 10/30/2023       Reactions   Prednisone Palpitations   Clindamycin/lincomycin Diarrhea   Latex Other (See Comments)   Skin blisters   Codeine Rash   Penicillins Rash   Tape Rash   Adhesive tape        Medication List        Accurate as of October 30, 2023 11:50 AM. If you have any questions, ask your nurse or doctor.          STOP taking these medications    Magnesium 250 MG Tabs Stopped by: Sharla Davis       TAKE these medications    acetaminophen  500 MG tablet Commonly known as: TYLENOL  Take 500 mg by mouth every 6 (six) hours as needed for moderate pain.   ALPRAZolam 0.25 MG tablet Commonly known as: XANAX Take 0.25 mg by mouth 2 (two) times daily as needed for anxiety.   amLODipine  2.5 MG tablet Commonly known as: NORVASC Take 2.5 mg by mouth daily. What changed: Another medication with the same name was removed. Continue taking this medication, and follow the directions you see here. Changed by: Sharla Davis   aspirin 81 MG tablet Take 81 mg by mouth daily.   atorvastatin 10 MG tablet Commonly known as: LIPITOR Take 10 mg by mouth at bedtime.   betamethasone valerate 0.1 % cream Commonly known as: VALISONE Apply 1 application. topically 2 (two) times daily as needed (rash).   CALCIUM PO Take 1,200 mg by mouth daily.   CENTRUM SILVER  PO Take 1 tablet by mouth daily.   Cetirizine  HCl 10 MG Caps Commonly known as: ZyrTEC  Allergy  One at bedtime   Dialyvite Vitamin D 5000 125 MCG (5000 UT) capsule Generic drug: Cholecalciferol Take 5,000 Units by mouth daily.   famotidine 20 MG tablet Commonly known as: PEPCID Take 20 mg by mouth daily as needed for heartburn or indigestion.   fluticasone 50 MCG/ACT nasal spray Commonly known as: FLONASE Place 2 sprays into both nostrils daily.   GLUCOSAMINE CHONDR COMPLEX PO Take 1 capsule by mouth daily as needed (rash).   losartan 25 MG tablet Commonly known as: COZAAR Take 12.5 mg by mouth daily.  metoprolol  succinate 50 MG 24 hr tablet Commonly known as: TOPROL -XL TAKE WITH OR IMMEDIATELY FOLLOWING A MEAL.TAKE 25 MG (HALF TABLET) IN THE MORNING AND TAKE 50 MG (ONE TABLET) AT NIGHT.   traZODone 50 MG tablet Commonly known as: DESYREL Take 25-50 mg by mouth at bedtime.   vitamin C 1000 MG tablet Take 1,000 mg by mouth daily.        Allergies:  Allergies  Allergen Reactions   Prednisone Palpitations   Clindamycin/Lincomycin Diarrhea   Latex Other (See Comments)    Skin blisters   Codeine Rash   Penicillins Rash   Tape Rash    Adhesive tape    Past Medical History, Surgical history, Social history, and Family History were reviewed and updated.  Review of Systems: All other 10 point  review of systems is negative.   Physical Exam:  height is 4\' 11"  (1.499 m) and weight is 174 lb (78.9 kg). Her oral temperature is 98.4 F (36.9 C). Her blood pressure is 112/64 and her pulse is 67. Her respiration is 18 and oxygen saturation is 98%.   Wt Readings from Last 3 Encounters:  10/30/23 174 lb (78.9 kg)  07/17/23 172 lb 6.4 oz (78.2 kg)  05/02/23 169 lb (76.7 kg)    Ocular: Sclerae unicteric, pupils equal, round and reactive to light Ear-nose-throat: Oropharynx clear, dentition fair Lymphatic: No cervical or supraclavicular adenopathy Lungs no rales or rhonchi, good excursion bilaterally Heart regular rate and rhythm, no murmur appreciated Abd soft, nontender, positive bowel sounds MSK no focal spinal tenderness, no joint edema Neuro: non-focal, well-oriented, appropriate affect  Lab Results  Component Value Date   WBC 6.4 10/30/2023   HGB 16.3 (H) 10/30/2023   HCT 47.5 (H) 10/30/2023   MCV 91.2 10/30/2023   PLT 202 10/30/2023   Lab Results  Component Value Date   FERRITIN 24 08/02/2023   IRON 97 08/02/2023   TIBC 410 08/02/2023   UIBC 313 08/02/2023   IRONPCTSAT 24 08/02/2023   Lab Results  Component Value Date   RBC 5.21 (H) 10/30/2023   No results found for: "KPAFRELGTCHN", "LAMBDASER", "KAPLAMBRATIO" No results found for: "IGGSERUM", "IGA", "IGMSERUM" No results found for: "TOTALPROTELP", "ALBUMINELP", "A1GS", "A2GS", "BETS", "BETA2SER", "GAMS", "MSPIKE", "SPEI"   Chemistry      Component Value Date/Time   NA 140 08/02/2023 1433   NA 142 09/14/2021 0921   NA 148 (H) 06/29/2017 0813   NA 143 03/30/2017 0805   K 3.8 08/02/2023 1433   K 3.8 06/29/2017 0813   K 4.0 03/30/2017 0805   CL 108 08/02/2023 1433   CL 105 06/29/2017 0813   CO2 26 08/02/2023 1433   CO2 28 06/29/2017 0813   CO2 26 03/30/2017 0805   BUN 19 08/02/2023 1433   BUN 16 09/14/2021 0921   BUN 21 06/29/2017 0813   BUN 18.2 03/30/2017 0805   CREATININE 0.99 08/02/2023 1433    CREATININE 1.3 (H) 06/29/2017 0813   CREATININE 1.0 03/30/2017 0805      Component Value Date/Time   CALCIUM 9.0 08/02/2023 1433   CALCIUM 9.5 06/29/2017 0813   CALCIUM 9.6 03/30/2017 0805   ALKPHOS 81 08/02/2023 1433   ALKPHOS 78 06/29/2017 0813   ALKPHOS 73 03/30/2017 0805   AST 21 08/02/2023 1433   AST 29 03/30/2017 0805   ALT 19 08/02/2023 1433   ALT 32 06/29/2017 0813   ALT 26 03/30/2017 0805   BILITOT 0.3 08/02/2023 1433   BILITOT 0.67 03/30/2017 0805  Encounter Diagnosis  Name Primary?   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   Today CBC is stable- question if she may have an aspect of sleep apnea. She does report snoring, palpitations and fatigue. I have suggested she discuss this with PCP further.  Iron studies are pending. We will set her up for phlebotomy if needed.   RTC 3 months labs(CBC, iron, ferritin, TSH, CMP) RTC 6 months APP, labs (CBC, CMP, iron, ferritin)  Sharla Davis, PA-C 4/28/202511:50 AM

## 2023-10-31 ENCOUNTER — Encounter: Payer: Self-pay | Admitting: Medical Oncology

## 2023-10-31 ENCOUNTER — Other Ambulatory Visit: Payer: Self-pay | Admitting: Medical Oncology

## 2023-11-01 ENCOUNTER — Inpatient Hospital Stay: Payer: PPO

## 2023-11-01 ENCOUNTER — Inpatient Hospital Stay: Payer: PPO | Admitting: Medical Oncology

## 2023-11-10 ENCOUNTER — Ambulatory Visit (HOSPITAL_BASED_OUTPATIENT_CLINIC_OR_DEPARTMENT_OTHER)
Admission: RE | Admit: 2023-11-10 | Discharge: 2023-11-10 | Disposition: A | Discharge status: Home / Self Care | Source: Ambulatory Visit | Attending: Medical Oncology | Admitting: Medical Oncology

## 2023-11-10 DIAGNOSIS — K7689 Other specified diseases of liver: Secondary | ICD-10-CM | POA: Diagnosis not present

## 2023-11-13 ENCOUNTER — Encounter: Payer: Self-pay | Admitting: Medical Oncology

## 2023-11-14 ENCOUNTER — Inpatient Hospital Stay

## 2023-11-14 ENCOUNTER — Inpatient Hospital Stay: Attending: Hematology & Oncology

## 2023-11-14 DIAGNOSIS — Z79899 Other long term (current) drug therapy: Secondary | ICD-10-CM | POA: Insufficient documentation

## 2023-11-14 NOTE — Progress Notes (Signed)
 Shelby Harrington presents today for phlebotomy per MD orders. Phlebotomy procedure started at 1110 and ended at 1140. 515 grams removed. IV NS bolus up for 500 mls. Patient observed for 30 minutes after procedure without any incident. Patient tolerated procedure well. IV needle removed intact.

## 2023-11-17 DIAGNOSIS — F419 Anxiety disorder, unspecified: Secondary | ICD-10-CM | POA: Diagnosis not present

## 2023-11-17 DIAGNOSIS — I700 Atherosclerosis of aorta: Secondary | ICD-10-CM | POA: Diagnosis not present

## 2023-11-17 DIAGNOSIS — F5101 Primary insomnia: Secondary | ICD-10-CM | POA: Diagnosis not present

## 2023-11-17 DIAGNOSIS — N183 Chronic kidney disease, stage 3 unspecified: Secondary | ICD-10-CM | POA: Diagnosis not present

## 2023-11-17 DIAGNOSIS — E559 Vitamin D deficiency, unspecified: Secondary | ICD-10-CM | POA: Diagnosis not present

## 2023-11-17 DIAGNOSIS — K760 Fatty (change of) liver, not elsewhere classified: Secondary | ICD-10-CM | POA: Diagnosis not present

## 2023-11-17 DIAGNOSIS — I129 Hypertensive chronic kidney disease with stage 1 through stage 4 chronic kidney disease, or unspecified chronic kidney disease: Secondary | ICD-10-CM | POA: Diagnosis not present

## 2023-11-17 DIAGNOSIS — I471 Supraventricular tachycardia, unspecified: Secondary | ICD-10-CM | POA: Diagnosis not present

## 2023-11-17 DIAGNOSIS — E669 Obesity, unspecified: Secondary | ICD-10-CM | POA: Diagnosis not present

## 2023-11-17 DIAGNOSIS — Z1331 Encounter for screening for depression: Secondary | ICD-10-CM | POA: Diagnosis not present

## 2023-11-17 DIAGNOSIS — Z0000 Encounter for general adult medical examination without abnormal findings: Secondary | ICD-10-CM | POA: Diagnosis not present

## 2023-11-30 DIAGNOSIS — I129 Hypertensive chronic kidney disease with stage 1 through stage 4 chronic kidney disease, or unspecified chronic kidney disease: Secondary | ICD-10-CM | POA: Diagnosis not present

## 2023-11-30 DIAGNOSIS — R0683 Snoring: Secondary | ICD-10-CM | POA: Diagnosis not present

## 2023-11-30 DIAGNOSIS — K760 Fatty (change of) liver, not elsewhere classified: Secondary | ICD-10-CM | POA: Diagnosis not present

## 2023-11-30 DIAGNOSIS — G478 Other sleep disorders: Secondary | ICD-10-CM | POA: Diagnosis not present

## 2023-11-30 DIAGNOSIS — R0681 Apnea, not elsewhere classified: Secondary | ICD-10-CM | POA: Diagnosis not present

## 2023-11-30 DIAGNOSIS — F419 Anxiety disorder, unspecified: Secondary | ICD-10-CM | POA: Diagnosis not present

## 2023-11-30 DIAGNOSIS — N183 Chronic kidney disease, stage 3 unspecified: Secondary | ICD-10-CM | POA: Diagnosis not present

## 2023-11-30 DIAGNOSIS — F5101 Primary insomnia: Secondary | ICD-10-CM | POA: Diagnosis not present

## 2023-12-11 ENCOUNTER — Ambulatory Visit: Attending: Family Medicine | Admitting: Audiology

## 2023-12-11 DIAGNOSIS — H9193 Unspecified hearing loss, bilateral: Secondary | ICD-10-CM | POA: Insufficient documentation

## 2023-12-12 NOTE — Procedures (Signed)
  [facility] [address] [place], [place]  [address] [phone]  AUDIOLOGICAL  EVALUATION  NAME: Shelby Harrington     DOB:   05-05-1951      MRN: [id number]                                                                                     DATE: 12/12/2023     REFERENT: Victorio Grave, MD STATUS: Outpatient DIAGNOSIS: Decreased hearing    History: [name] was seen for an audiological evaluation due to concerns regarding her hearing sensitivity. She was accompanied to the appointment by her husband.  [name] reports a longstanding history of left eustachian tube dysfunction. She reports feeling left drainage. She reports left aural fullness. [name] denies tinnitus and otalgia. [name] reports increased difficulty hearing the television and hearing on the telephone.   Evaluation:  Otoscopy showed a clear view of the tympanic membranes, bilaterally Tympanometry results were consistent with normal middle ear function (Type A), bilaterally.  Audiometric testing was completed using Conventional Audiometry techniques with insert earphones and TDH headphones. Test results are consistent with in the left ear with normal hearing sensitivity at [phone] Hz. Test results are consistent in the right ear with essentially normal hearing sensitivity sloping to a mild hearing loss at 8000 Hz. Speech Recognition Thresholds were obtained at 25 dB HL in the right ear and at 20  dB HL in the left ear. Word Recognition Testing was completed at 65 dB HL and [name] scored 100%, bilaterally.      Results:  The test results were reviewed with [name] and her husband. Test results are consistent with in the left ear with normal hearing sensitivity at [phone] Hz. Test results are consistent in the right ear with essentially normal hearing sensitivity sloping to a mild hearing loss at 8000 Hz. [name] may have hearing and communication difficulty in adverse listening  environments. She will benefit from the use of good communication strategies.   Recommendations: 1.   Use of good communication strategies 2.   Return in 2 years for audiological monitoring.    30 minutes spent testing and counseling on results.   If you have any questions please feel free to contact me at [phone].  Bert Britain Audiologist, Au.D., CCC-A 12/12/2023  9:59 AM  Cc: Victorio Grave, MD

## 2023-12-21 DIAGNOSIS — G4733 Obstructive sleep apnea (adult) (pediatric): Secondary | ICD-10-CM | POA: Diagnosis not present

## 2023-12-25 DIAGNOSIS — K760 Fatty (change of) liver, not elsewhere classified: Secondary | ICD-10-CM | POA: Diagnosis not present

## 2023-12-25 DIAGNOSIS — I129 Hypertensive chronic kidney disease with stage 1 through stage 4 chronic kidney disease, or unspecified chronic kidney disease: Secondary | ICD-10-CM | POA: Diagnosis not present

## 2023-12-25 DIAGNOSIS — N183 Chronic kidney disease, stage 3 unspecified: Secondary | ICD-10-CM | POA: Diagnosis not present

## 2023-12-25 DIAGNOSIS — G4733 Obstructive sleep apnea (adult) (pediatric): Secondary | ICD-10-CM | POA: Diagnosis not present

## 2024-01-08 DIAGNOSIS — Z1331 Encounter for screening for depression: Secondary | ICD-10-CM | POA: Diagnosis not present

## 2024-01-08 DIAGNOSIS — I129 Hypertensive chronic kidney disease with stage 1 through stage 4 chronic kidney disease, or unspecified chronic kidney disease: Secondary | ICD-10-CM | POA: Diagnosis not present

## 2024-01-08 DIAGNOSIS — E88819 Insulin resistance, unspecified: Secondary | ICD-10-CM | POA: Diagnosis not present

## 2024-01-08 DIAGNOSIS — G4733 Obstructive sleep apnea (adult) (pediatric): Secondary | ICD-10-CM | POA: Diagnosis not present

## 2024-01-08 DIAGNOSIS — N183 Chronic kidney disease, stage 3 unspecified: Secondary | ICD-10-CM | POA: Diagnosis not present

## 2024-01-08 DIAGNOSIS — I471 Supraventricular tachycardia, unspecified: Secondary | ICD-10-CM | POA: Diagnosis not present

## 2024-01-08 DIAGNOSIS — E66811 Obesity, class 1: Secondary | ICD-10-CM | POA: Diagnosis not present

## 2024-01-08 DIAGNOSIS — I700 Atherosclerosis of aorta: Secondary | ICD-10-CM | POA: Diagnosis not present

## 2024-01-08 DIAGNOSIS — Z131 Encounter for screening for diabetes mellitus: Secondary | ICD-10-CM | POA: Diagnosis not present

## 2024-01-08 DIAGNOSIS — F419 Anxiety disorder, unspecified: Secondary | ICD-10-CM | POA: Diagnosis not present

## 2024-01-08 DIAGNOSIS — K760 Fatty (change of) liver, not elsewhere classified: Secondary | ICD-10-CM | POA: Diagnosis not present

## 2024-01-08 DIAGNOSIS — Z6834 Body mass index (BMI) 34.0-34.9, adult: Secondary | ICD-10-CM | POA: Diagnosis not present

## 2024-01-29 ENCOUNTER — Inpatient Hospital Stay: Attending: Hematology & Oncology

## 2024-01-29 DIAGNOSIS — Z79899 Other long term (current) drug therapy: Secondary | ICD-10-CM | POA: Diagnosis not present

## 2024-01-29 LAB — CBC WITH DIFFERENTIAL (CANCER CENTER ONLY)
Abs Immature Granulocytes: 0.01 K/uL (ref 0.00–0.07)
Basophils Absolute: 0.1 K/uL (ref 0.0–0.1)
Basophils Relative: 1 %
Eosinophils Absolute: 0.3 K/uL (ref 0.0–0.5)
Eosinophils Relative: 5 %
HCT: 46.3 % — ABNORMAL HIGH (ref 36.0–46.0)
Hemoglobin: 15.3 g/dL — ABNORMAL HIGH (ref 12.0–15.0)
Immature Granulocytes: 0 %
Lymphocytes Relative: 39 %
Lymphs Abs: 2.4 K/uL (ref 0.7–4.0)
MCH: 29.3 pg (ref 26.0–34.0)
MCHC: 33 g/dL (ref 30.0–36.0)
MCV: 88.7 fL (ref 80.0–100.0)
Monocytes Absolute: 0.6 K/uL (ref 0.1–1.0)
Monocytes Relative: 10 %
Neutro Abs: 2.7 K/uL (ref 1.7–7.7)
Neutrophils Relative %: 45 %
Platelet Count: 203 K/uL (ref 150–400)
RBC: 5.22 MIL/uL — ABNORMAL HIGH (ref 3.87–5.11)
RDW: 13.2 % (ref 11.5–15.5)
WBC Count: 6 K/uL (ref 4.0–10.5)
nRBC: 0 % (ref 0.0–0.2)

## 2024-01-29 LAB — CMP (CANCER CENTER ONLY)
ALT: 24 U/L (ref 0–44)
AST: 30 U/L (ref 15–41)
Albumin: 4.2 g/dL (ref 3.5–5.0)
Alkaline Phosphatase: 99 U/L (ref 38–126)
Anion gap: 9 (ref 5–15)
BUN: 18 mg/dL (ref 8–23)
CO2: 23 mmol/L (ref 22–32)
Calcium: 8.8 mg/dL — ABNORMAL LOW (ref 8.9–10.3)
Chloride: 110 mmol/L (ref 98–111)
Creatinine: 1.16 mg/dL — ABNORMAL HIGH (ref 0.44–1.00)
GFR, Estimated: 50 mL/min — ABNORMAL LOW (ref >60)
Glucose, Bld: 100 mg/dL — ABNORMAL HIGH (ref 70–99)
Potassium: 4.7 mmol/L (ref 3.5–5.1)
Sodium: 143 mmol/L (ref 135–145)
Total Bilirubin: 0.3 mg/dL (ref 0.0–1.2)
Total Protein: 6.5 g/dL (ref 6.5–8.1)

## 2024-01-29 LAB — IRON AND IRON BINDING CAPACITY (CC-WL,HP ONLY)
Iron: 71 ug/dL (ref 28–170)
Saturation Ratios: 15 % (ref 10.4–31.8)
TIBC: 468 ug/dL — ABNORMAL HIGH (ref 250–450)
UIBC: 397 ug/dL

## 2024-01-29 LAB — FERRITIN: Ferritin: 26 ng/mL (ref 11–307)

## 2024-01-30 ENCOUNTER — Ambulatory Visit: Payer: Self-pay | Admitting: Medical Oncology

## 2024-01-30 DIAGNOSIS — E88819 Insulin resistance, unspecified: Secondary | ICD-10-CM | POA: Diagnosis not present

## 2024-01-30 DIAGNOSIS — G4733 Obstructive sleep apnea (adult) (pediatric): Secondary | ICD-10-CM | POA: Diagnosis not present

## 2024-01-30 DIAGNOSIS — K760 Fatty (change of) liver, not elsewhere classified: Secondary | ICD-10-CM | POA: Diagnosis not present

## 2024-01-30 DIAGNOSIS — I700 Atherosclerosis of aorta: Secondary | ICD-10-CM | POA: Diagnosis not present

## 2024-01-30 DIAGNOSIS — R5383 Other fatigue: Secondary | ICD-10-CM | POA: Diagnosis not present

## 2024-01-30 DIAGNOSIS — I129 Hypertensive chronic kidney disease with stage 1 through stage 4 chronic kidney disease, or unspecified chronic kidney disease: Secondary | ICD-10-CM | POA: Diagnosis not present

## 2024-01-30 DIAGNOSIS — I471 Supraventricular tachycardia, unspecified: Secondary | ICD-10-CM | POA: Diagnosis not present

## 2024-01-30 DIAGNOSIS — E559 Vitamin D deficiency, unspecified: Secondary | ICD-10-CM | POA: Diagnosis not present

## 2024-01-30 DIAGNOSIS — Z6834 Body mass index (BMI) 34.0-34.9, adult: Secondary | ICD-10-CM | POA: Diagnosis not present

## 2024-01-30 DIAGNOSIS — E66811 Obesity, class 1: Secondary | ICD-10-CM | POA: Diagnosis not present

## 2024-01-30 DIAGNOSIS — N183 Chronic kidney disease, stage 3 unspecified: Secondary | ICD-10-CM | POA: Diagnosis not present

## 2024-02-13 DIAGNOSIS — I700 Atherosclerosis of aorta: Secondary | ICD-10-CM | POA: Diagnosis not present

## 2024-02-13 DIAGNOSIS — E88819 Insulin resistance, unspecified: Secondary | ICD-10-CM | POA: Diagnosis not present

## 2024-02-13 DIAGNOSIS — E66811 Obesity, class 1: Secondary | ICD-10-CM | POA: Diagnosis not present

## 2024-02-13 DIAGNOSIS — Z6834 Body mass index (BMI) 34.0-34.9, adult: Secondary | ICD-10-CM | POA: Diagnosis not present

## 2024-02-13 DIAGNOSIS — E559 Vitamin D deficiency, unspecified: Secondary | ICD-10-CM | POA: Diagnosis not present

## 2024-02-13 DIAGNOSIS — K760 Fatty (change of) liver, not elsewhere classified: Secondary | ICD-10-CM | POA: Diagnosis not present

## 2024-02-13 DIAGNOSIS — I129 Hypertensive chronic kidney disease with stage 1 through stage 4 chronic kidney disease, or unspecified chronic kidney disease: Secondary | ICD-10-CM | POA: Diagnosis not present

## 2024-02-13 DIAGNOSIS — F419 Anxiety disorder, unspecified: Secondary | ICD-10-CM | POA: Diagnosis not present

## 2024-02-13 DIAGNOSIS — N183 Chronic kidney disease, stage 3 unspecified: Secondary | ICD-10-CM | POA: Diagnosis not present

## 2024-02-13 DIAGNOSIS — I471 Supraventricular tachycardia, unspecified: Secondary | ICD-10-CM | POA: Diagnosis not present

## 2024-02-13 DIAGNOSIS — G4733 Obstructive sleep apnea (adult) (pediatric): Secondary | ICD-10-CM | POA: Diagnosis not present

## 2024-03-04 ENCOUNTER — Emergency Department (HOSPITAL_BASED_OUTPATIENT_CLINIC_OR_DEPARTMENT_OTHER): Admitting: Radiology

## 2024-03-04 ENCOUNTER — Other Ambulatory Visit: Payer: Self-pay

## 2024-03-04 ENCOUNTER — Emergency Department (HOSPITAL_BASED_OUTPATIENT_CLINIC_OR_DEPARTMENT_OTHER)
Admission: EM | Admit: 2024-03-04 | Discharge: 2024-03-04 | Disposition: A | Discharge status: Home / Self Care | Attending: Emergency Medicine | Admitting: Emergency Medicine

## 2024-03-04 ENCOUNTER — Encounter (HOSPITAL_BASED_OUTPATIENT_CLINIC_OR_DEPARTMENT_OTHER): Payer: Self-pay

## 2024-03-04 ENCOUNTER — Emergency Department (HOSPITAL_BASED_OUTPATIENT_CLINIC_OR_DEPARTMENT_OTHER)

## 2024-03-04 DIAGNOSIS — R001 Bradycardia, unspecified: Secondary | ICD-10-CM | POA: Diagnosis not present

## 2024-03-04 DIAGNOSIS — R053 Chronic cough: Secondary | ICD-10-CM | POA: Insufficient documentation

## 2024-03-04 DIAGNOSIS — Z91040 Latex allergy status: Secondary | ICD-10-CM | POA: Insufficient documentation

## 2024-03-04 DIAGNOSIS — R0781 Pleurodynia: Secondary | ICD-10-CM | POA: Diagnosis not present

## 2024-03-04 DIAGNOSIS — R0789 Other chest pain: Secondary | ICD-10-CM | POA: Diagnosis not present

## 2024-03-04 DIAGNOSIS — K7689 Other specified diseases of liver: Secondary | ICD-10-CM | POA: Diagnosis not present

## 2024-03-04 DIAGNOSIS — Z7982 Long term (current) use of aspirin: Secondary | ICD-10-CM | POA: Insufficient documentation

## 2024-03-04 DIAGNOSIS — R918 Other nonspecific abnormal finding of lung field: Secondary | ICD-10-CM

## 2024-03-04 DIAGNOSIS — R072 Precordial pain: Secondary | ICD-10-CM | POA: Insufficient documentation

## 2024-03-04 DIAGNOSIS — R9431 Abnormal electrocardiogram [ECG] [EKG]: Secondary | ICD-10-CM | POA: Diagnosis not present

## 2024-03-04 DIAGNOSIS — R911 Solitary pulmonary nodule: Secondary | ICD-10-CM | POA: Diagnosis not present

## 2024-03-04 DIAGNOSIS — R059 Cough, unspecified: Secondary | ICD-10-CM | POA: Diagnosis not present

## 2024-03-04 LAB — CBC
HCT: 48.7 % — ABNORMAL HIGH (ref 36.0–46.0)
Hemoglobin: 16.2 g/dL — ABNORMAL HIGH (ref 12.0–15.0)
MCH: 29.4 pg (ref 26.0–34.0)
MCHC: 33.3 g/dL (ref 30.0–36.0)
MCV: 88.4 fL (ref 80.0–100.0)
Platelets: 179 K/uL (ref 150–400)
RBC: 5.51 MIL/uL — ABNORMAL HIGH (ref 3.87–5.11)
RDW: 14.8 % (ref 11.5–15.5)
WBC: 5.9 K/uL (ref 4.0–10.5)
nRBC: 0 % (ref 0.0–0.2)

## 2024-03-04 LAB — TROPONIN T, HIGH SENSITIVITY
Troponin T High Sensitivity: <15 ng/L (ref 0–19)
Troponin T High Sensitivity: <15 ng/L (ref 0–19)

## 2024-03-04 LAB — BASIC METABOLIC PANEL WITH GFR
Anion gap: 13 (ref 5–15)
BUN: 16 mg/dL (ref 8–23)
CO2: 22 mmol/L (ref 22–32)
Calcium: 9.3 mg/dL (ref 8.9–10.3)
Chloride: 107 mmol/L (ref 98–111)
Creatinine, Ser: 1.09 mg/dL — ABNORMAL HIGH (ref 0.44–1.00)
GFR, Estimated: 54 mL/min — ABNORMAL LOW (ref >60)
Glucose, Bld: 96 mg/dL (ref 70–99)
Potassium: 4.4 mmol/L (ref 3.5–5.1)
Sodium: 142 mmol/L (ref 135–145)

## 2024-03-04 LAB — RESP PANEL BY RT-PCR (RSV, FLU A&B, COVID)  RVPGX2
Influenza A by PCR: NEGATIVE
Influenza B by PCR: NEGATIVE
Resp Syncytial Virus by PCR: NEGATIVE
SARS Coronavirus 2 by RT PCR: NEGATIVE

## 2024-03-04 MED ORDER — IOHEXOL 350 MG/ML SOLN
100.0000 mL | Freq: Once | INTRAVENOUS | Status: AC | PRN
  Administered 2024-03-04: 75 mL via INTRAVENOUS

## 2024-03-04 NOTE — ED Provider Notes (Signed)
 [place] EMERGENCY DEPARTMENT AT [facility] Provider Note   CSN: [id number] Arrival date & time: 03/04/24  1205     Patient presents with: Abnormal ECG   Shelby Harrington is a 73 y.o. female.   Pt with c/o pain to right chest in past 3-4 days. Constant, dull, pleuritic at times, non radiating. At rest. No exertional chest pain. No associated sob, nv or diaphoresis. Has noted chronic/recurrent cough in past 5-6 months, had wondered whether possible allergy  related but persistent and a bit more so in past few days. No sore throat, runny nose or other uri symptoms.  No fever or chills. No hx heart disease. No leg pain or swelling. No hx dvt or pe.   The history is provided by the patient and medical records.       Prior to Admission medications   Medication Sig Start Date End Date Taking? Authorizing Provider  acetaminophen  (TYLENOL ) 500 MG tablet Take 500 mg by mouth every 6 (six) hours as needed for moderate pain.    [provider]  ALPRAZolam (XANAX) 0.25 MG tablet Take 0.25 mg by mouth 2 (two) times daily as needed for anxiety.    [provider]  amLODipine (NORVASC) 2.5 MG tablet Take 2.5 mg by mouth daily. 08/03/23   [provider]  Ascorbic Acid (VITAMIN C) 1000 MG tablet Take 1,000 mg by mouth daily.    [provider]  aspirin 81 MG tablet Take 81 mg by mouth daily.    [provider]  atorvastatin (LIPITOR) 10 MG tablet Take 10 mg by mouth at bedtime. 11/01/21   [provider]  betamethasone valerate (VALISONE) 0.1 % cream Apply 1 application. topically 2 (two) times daily as needed (rash).    [provider]  CALCIUM PO Take 1,200 mg by mouth daily.    [provider]  Cetirizine  HCl (ZYRTEC  ALLERGY ) 10 MG CAPS One at bedtime 08/12/16   Wert, Michael B, MD  Cholecalciferol (DIALYVITE VITAMIN D 5000) 125 MCG (5000 UT) capsule Take 5,000 Units by mouth daily.    [provider]  famotidine  (PEPCID) 20 MG tablet Take 20 mg by mouth daily as needed for heartburn or indigestion.    [provider]  fluticasone (FLONASE) 50 MCG/ACT nasal spray Place 2 sprays into both nostrils daily. 05/06/16   [provider]  Glucosamine-Chondroitin (GLUCOSAMINE CHONDR COMPLEX PO) Take 1 capsule by mouth daily as needed (rash).    [provider]  losartan (COZAAR) 25 MG tablet Take 12.5 mg by mouth daily. 12/17/20   [provider]  metoprolol  succinate (TOPROL -XL) 50 MG 24 hr tablet TAKE WITH OR IMMEDIATELY FOLLOWING A MEAL.TAKE 25 MG (HALF TABLET) IN THE MORNING AND TAKE 50 MG (ONE TABLET) AT NIGHT. 10/03/23   Tobb, Kardie, DO  Multiple Vitamins-Minerals (CENTRUM SILVER  PO) Take 1 tablet by mouth daily.    [provider]  traZODone (DESYREL) 50 MG tablet Take 25-50 mg by mouth at bedtime.    [provider]    Allergies: Prednisone, Clindamycin/lincomycin, Latex, Codeine, Penicillins, and Tape    Review of Systems  Constitutional:  Negative for chills and fever.  HENT:  Negative for sore throat.   Respiratory:  Positive for cough. Negative for shortness of breath.   Cardiovascular:  Negative for chest pain and leg swelling.  Gastrointestinal:  Negative for abdominal pain, nausea and vomiting.  Genitourinary:  Negative for flank pain.  Musculoskeletal:  Negative for back pain and  neck pain.  Neurological:  Negative for headaches.    Updated Vital Signs BP 137/76   Pulse (!) 43   Temp 98.6 F (37 C)   Resp 16   SpO2 98%   Physical Exam Vitals and nursing note reviewed.  Constitutional:      Appearance: Normal appearance. She is well-developed.  HENT:     Head: Atraumatic.     Nose: Nose normal.     Mouth/Throat:     Mouth: Mucous membranes are moist.  Eyes:     General: No scleral icterus.    Conjunctiva/sclera: Conjunctivae normal.  Neck:     Trachea: No tracheal deviation.  Cardiovascular:     Rate and Rhythm: Normal rate  and regular rhythm.     Pulses: Normal pulses.     Heart sounds: Normal heart sounds. No murmur heard.    No friction rub. No gallop.  Pulmonary:     Effort: Pulmonary effort is normal. No respiratory distress.     Breath sounds: Normal breath sounds.  Chest:     Chest wall: No tenderness.  Abdominal:     General: There is no distension.     Palpations: Abdomen is soft.     Tenderness: There is no abdominal tenderness.  Genitourinary:    Comments: No cva tenderness.  Musculoskeletal:        General: No swelling or tenderness.     Cervical back: Normal range of motion and neck supple. No rigidity. No muscular tenderness.     Right lower leg: No edema.     Left lower leg: No edema.  Skin:    General: Skin is warm and dry.     Findings: No rash.  Neurological:     Mental Status: She is alert.     Comments: Alert, speech normal.   Psychiatric:        Mood and Affect: Mood normal.     (all labs ordered are listed, but only abnormal results are displayed) Results for orders placed or performed during the hospital encounter of 03/04/24  Basic metabolic panel   Collection Time: 03/04/24 12:14 PM  Result Value Ref Range   Sodium 142 135 - 145 mmol/L   Potassium 4.4 3.5 - 5.1 mmol/L   Chloride 107 98 - 111 mmol/L   CO2 22 22 - 32 mmol/L   Glucose, Bld 96 70 - 99 mg/dL   BUN 16 8 - 23 mg/dL   Creatinine, Ser 8.90 (H) 0.44 - 1.00 mg/dL   Calcium 9.3 8.9 - 89.6 mg/dL   GFR, Estimated 54 (L) >60 mL/min   Anion gap 13 5 - 15  CBC   Collection Time: 03/04/24 12:14 PM  Result Value Ref Range   WBC 5.9 4.0 - 10.5 K/uL   RBC 5.51 (H) 3.87 - 5.11 MIL/uL   Hemoglobin 16.2 (H) 12.0 - 15.0 g/dL   HCT 51.2 (H) 63.9 - 53.9 %   MCV 88.4 80.0 - 100.0 fL   MCH 29.4 26.0 - 34.0 pg   MCHC 33.3 30.0 - 36.0 g/dL   RDW 85.1 88.4 - 84.4 %   Platelets 179 150 - 400 K/uL   nRBC 0.0 0.0 - 0.2 %  Troponin T, High Sensitivity   Collection Time: 03/04/24 12:14 PM  Result Value Ref Range    Troponin T High Sensitivity <15 0 - 19 ng/L  Troponin T, High Sensitivity   Collection Time: 03/04/24  3:00 PM  Result Value Ref Range  Troponin T High Sensitivity <15 0 - 19 ng/L   CT Angio Chest PE W/Cm &/Or Wo Cm Result Date: 03/04/2024 CLINICAL DATA:  Pulmonary embolism suspected, high probability. Pleuritic light chest pain with prolonged cough. EXAM: CT ANGIOGRAPHY CHEST WITH CONTRAST TECHNIQUE: Multidetector CT imaging of the chest was performed using the standard protocol during bolus administration of intravenous contrast. Multiplanar CT image reconstructions and MIPs were obtained to evaluate the vascular anatomy. RADIATION DOSE REDUCTION: This exam was performed according to the departmental dose-optimization program which includes automated exposure control, adjustment of the mA and/or kV according to patient size and/or use of iterative reconstruction technique. CONTRAST:  75mL OMNIPAQUE  IOHEXOL  350 MG/ML SOLN COMPARISON:  09/29/2021, 12/02/2016. FINDINGS: Cardiovascular: The heart is borderline enlarged and there is no pericardial effusion. There is atherosclerotic calcification of the aorta without evidence of aneurysm. The pulmonary trunk is normal in caliber. No evidence of pulmonary embolism is seen. Mediastinum/Nodes: No enlarged mediastinal, hilar, or axillary lymph nodes. Thyroid  gland, trachea, and esophagus demonstrate no significant findings. There is a small hiatal hernia. Lungs/Pleura: Minimal atelectasis or scarring is noted bilaterally. No effusion or pneumothorax is seen. There is a 3 mm nodule in the right lower lobe, axial image 94. There is a 3 mm nodule in the left lower lobe, axial image 91. Upper Abdomen: Scattered cysts are present within the liver. No acute abnormality. Musculoskeletal: Degenerative changes are present in the thoracic spine. No acute osseous abnormality. Review of the MIP images confirms the above findings. IMPRESSION: 1. No evidence of pulmonary embolism  or other acute process. 2. 3 mm lung nodules bilaterally. No follow-up needed if patient is low-risk (and has no known or suspected primary neoplasm). Non-contrast chest CT can be considered in 12 months if patient is high-risk. This recommendation follows the consensus statement: Guidelines for Management of Incidental Pulmonary Nodules Detected on CT Images: From the Fleischner Society 2017; Radiology 2017; 284:228-243. 3. Small hiatal hernia. 4. Coronary artery calcifications and aortic atherosclerosis. Electronically Signed   By: Leita Birmingham M.D.   On: 03/04/2024 15:44   DG Chest 2 View Result Date: 03/04/2024 EXAM: 2 VIEW(S) XRAY OF THE CHEST 03/04/2024 12:41:49 PM COMPARISON: 2-view chest x-ray 03/04/2024 11:24 AM CLINICAL HISTORY: CP. Per triage notes: Pt c/o pressure/ pain in R chest lung area, goes around to lung/ shoulder blade area. Pain worse w inspiration- becomes a sharp pain. Onset Saturday. Seen at UC, did an xray that was normal, but my EKG was not normal. Advises ; associated cough but no other sick symptoms FINDINGS: LUNGS AND PLEURA: No focal pulmonary opacity. No pulmonary edema. No pleural effusion. No pneumothorax. HEART AND MEDIASTINUM: No acute abnormality of the cardiac and mediastinal silhouettes. BONES AND SOFT TISSUES: No acute osseous abnormality. IMPRESSION: 1. No acute process. Electronically signed by: Lonni Necessary MD 03/04/2024 12:47 PM EDT RP Workstation: [id number]     EKG: EKG Interpretation Date/Time:  Monday March 04 2024 12:14:24 EDT Ventricular Rate:  53 PR Interval:  175 QRS Duration:  96 QT Interval:  449 QTC Calculation: 422 R Axis:   -75  Text Interpretation: Sinus rhythm Left anterior fascicular block Non-specific ST-t changes No significant change since last tracing Confirmed by Bernard Drivers ([id number]) on 03/04/2024 2:35:03 PM  Radiology: CT Angio Chest PE W/Cm &/Or Wo Cm Result Date: 03/04/2024 CLINICAL DATA:  Pulmonary embolism  suspected, high probability. Pleuritic light chest pain with prolonged cough. EXAM: CT ANGIOGRAPHY CHEST WITH CONTRAST TECHNIQUE: Multidetector CT imaging of the chest was  performed using the standard protocol during bolus administration of intravenous contrast. Multiplanar CT image reconstructions and MIPs were obtained to evaluate the vascular anatomy. RADIATION DOSE REDUCTION: This exam was performed according to the departmental dose-optimization program which includes automated exposure control, adjustment of the mA and/or kV according to patient size and/or use of iterative reconstruction technique. CONTRAST:  75mL OMNIPAQUE  IOHEXOL  350 MG/ML SOLN COMPARISON:  09/29/2021, 12/02/2016. FINDINGS: Cardiovascular: The heart is borderline enlarged and there is no pericardial effusion. There is atherosclerotic calcification of the aorta without evidence of aneurysm. The pulmonary trunk is normal in caliber. No evidence of pulmonary embolism is seen. Mediastinum/Nodes: No enlarged mediastinal, hilar, or axillary lymph nodes. Thyroid  gland, trachea, and esophagus demonstrate no significant findings. There is a small hiatal hernia. Lungs/Pleura: Minimal atelectasis or scarring is noted bilaterally. No effusion or pneumothorax is seen. There is a 3 mm nodule in the right lower lobe, axial image 94. There is a 3 mm nodule in the left lower lobe, axial image 91. Upper Abdomen: Scattered cysts are present within the liver. No acute abnormality. Musculoskeletal: Degenerative changes are present in the thoracic spine. No acute osseous abnormality. Review of the MIP images confirms the above findings. IMPRESSION: 1. No evidence of pulmonary embolism or other acute process. 2. 3 mm lung nodules bilaterally. No follow-up needed if patient is low-risk (and has no known or suspected primary neoplasm). Non-contrast chest CT can be considered in 12 months if patient is high-risk. This recommendation follows the consensus statement:  Guidelines for Management of Incidental Pulmonary Nodules Detected on CT Images: From the Fleischner Society 2017; Radiology 2017; 284:228-243. 3. Small hiatal hernia. 4. Coronary artery calcifications and aortic atherosclerosis. Electronically Signed   By: Leita Birmingham M.D.   On: 03/04/2024 15:44   DG Chest 2 View Result Date: 03/04/2024 EXAM: 2 VIEW(S) XRAY OF THE CHEST 03/04/2024 12:41:49 PM COMPARISON: 2-view chest x-ray 03/04/2024 11:24 AM CLINICAL HISTORY: CP. Per triage notes: Pt c/o pressure/ pain in R chest lung area, goes around to lung/ shoulder blade area. Pain worse w inspiration- becomes a sharp pain. Onset Saturday. Seen at UC, did an xray that was normal, but my EKG was not normal. Advises ; associated cough but no other sick symptoms FINDINGS: LUNGS AND PLEURA: No focal pulmonary opacity. No pulmonary edema. No pleural effusion. No pneumothorax. HEART AND MEDIASTINUM: No acute abnormality of the cardiac and mediastinal silhouettes. BONES AND SOFT TISSUES: No acute osseous abnormality. IMPRESSION: 1. No acute process. Electronically signed by: Lonni Necessary MD 03/04/2024 12:47 PM EDT RP Workstation: [id number]     Procedures   Medications Ordered in the ED  iohexol  (OMNIPAQUE ) 350 MG/ML injection 100 mL (75 mLs Intravenous Contrast Given 03/04/24 1516)                                    Medical Decision Making Problems Addressed: Chronic cough: chronic illness or injury Lung nodules: chronic illness or injury Precordial chest pain: acute illness or injury with systemic symptoms that poses a threat to life or bodily functions Sinus bradycardia: acute illness or injury  Amount and/or Complexity of Data Reviewed External Data Reviewed: notes. Labs: ordered. Decision-making details documented in ED Course. Radiology: ordered and independent interpretation performed. Decision-making details documented in ED Course. ECG/medicine tests: ordered and independent  interpretation performed. Decision-making details documented in ED Course.  Risk Prescription drug management. Decision regarding hospitalization.   Iv  ns. Continuous pulse ox and cardiac monitoring. Labs ordered/sent. Imaging ordered.   Differential diagnosis includes acs, pna, pe, msk cp, etc. Dispo decision including potential need for admission considered - will get labs and imaging and reassess.   Reviewed nursing notes and prior charts for additional history. External reports reviewed.   Cardiac monitor: sinus rhythm, rate 55.  Labs reviewed/interpreted by me - wbc normal, hgb 16. Trop normal. Initial and delta trop normal and not increasing - felt not c/w acs.   Xrays reviewed/interpreted by me - no pna.   CT reviewed/interpreted by me - no PE.  Incididental note of small lung nodules, shared w pt, pcp f/u.   Pt currently appear stable for ed d/c.   Rec close pcp/card f/u.  Return precautions provided.       Final diagnoses:  Precordial chest pain  Lung nodules  Sinus bradycardia    ED Discharge Orders     None          Bernard Drivers, MD 03/04/24 1559

## 2024-03-04 NOTE — ED Triage Notes (Signed)
 Pt c/o pressure/ pain in R chest lung area, goes around to lung/ shoulder blade area. Pain worse w inspiration- becomes a sharp pain. Onset Saturday. Seen at [facility], did an xray that was normal, but my EKG was not normal. Advises associated cough but no other sick symptoms

## 2024-03-04 NOTE — ED Notes (Signed)
 Patient transported to CT

## 2024-03-04 NOTE — ED Notes (Signed)
 Reviewed AVS/discharge instruction with patient. Time allotted for and all questions answered. Patient is agreeable for d/c and escorted to ed exit by staff.

## 2024-03-04 NOTE — Discharge Instructions (Addendum)
 It was our pleasure to provide your ER care today - we hope that you feel better. Take acetaminophen  as need. If GI/reflux symptoms, you may try pepcid and maalox as need.   For recent chest pain, follow up closely with cardiologist in the next 1-2 weeks - we made referral, and they should be contacting you with an appointment in the next few days.  For chronic cough, discuss with your doctor whether possibly your losartan is contributing to those symptoms and/or trying possible alternate therapy.   Your imaging tests were read as showing no acute process. Incidental note was made of 3 mm lung nodules bilaterally and a small hiatal hernia  - discuss with primary care doctor and have repeat imaging in one year.   Return to ER right away if worse, new symptoms, fevers, recurrent/persistent chest pain, increased trouble breathing, fainting, or other concern.

## 2024-03-06 DIAGNOSIS — H2513 Age-related nuclear cataract, bilateral: Secondary | ICD-10-CM | POA: Diagnosis not present

## 2024-03-06 DIAGNOSIS — H53001 Unspecified amblyopia, right eye: Secondary | ICD-10-CM | POA: Diagnosis not present

## 2024-03-06 DIAGNOSIS — H31001 Unspecified chorioretinal scars, right eye: Secondary | ICD-10-CM | POA: Diagnosis not present

## 2024-03-06 DIAGNOSIS — H52202 Unspecified astigmatism, left eye: Secondary | ICD-10-CM | POA: Diagnosis not present

## 2024-03-13 DIAGNOSIS — I129 Hypertensive chronic kidney disease with stage 1 through stage 4 chronic kidney disease, or unspecified chronic kidney disease: Secondary | ICD-10-CM | POA: Diagnosis not present

## 2024-03-13 DIAGNOSIS — K760 Fatty (change of) liver, not elsewhere classified: Secondary | ICD-10-CM | POA: Diagnosis not present

## 2024-03-13 DIAGNOSIS — I700 Atherosclerosis of aorta: Secondary | ICD-10-CM | POA: Diagnosis not present

## 2024-03-13 DIAGNOSIS — E88819 Insulin resistance, unspecified: Secondary | ICD-10-CM | POA: Diagnosis not present

## 2024-03-13 DIAGNOSIS — E559 Vitamin D deficiency, unspecified: Secondary | ICD-10-CM | POA: Diagnosis not present

## 2024-03-13 DIAGNOSIS — F419 Anxiety disorder, unspecified: Secondary | ICD-10-CM | POA: Diagnosis not present

## 2024-03-13 DIAGNOSIS — G4733 Obstructive sleep apnea (adult) (pediatric): Secondary | ICD-10-CM | POA: Diagnosis not present

## 2024-03-13 DIAGNOSIS — N183 Chronic kidney disease, stage 3 unspecified: Secondary | ICD-10-CM | POA: Diagnosis not present

## 2024-03-13 DIAGNOSIS — I471 Supraventricular tachycardia, unspecified: Secondary | ICD-10-CM | POA: Diagnosis not present

## 2024-03-13 DIAGNOSIS — E66811 Obesity, class 1: Secondary | ICD-10-CM | POA: Diagnosis not present

## 2024-03-13 DIAGNOSIS — Z6832 Body mass index (BMI) 32.0-32.9, adult: Secondary | ICD-10-CM | POA: Diagnosis not present

## 2024-04-15 DIAGNOSIS — E88819 Insulin resistance, unspecified: Secondary | ICD-10-CM | POA: Diagnosis not present

## 2024-04-15 DIAGNOSIS — F419 Anxiety disorder, unspecified: Secondary | ICD-10-CM | POA: Diagnosis not present

## 2024-04-15 DIAGNOSIS — N183 Chronic kidney disease, stage 3 unspecified: Secondary | ICD-10-CM | POA: Diagnosis not present

## 2024-04-15 DIAGNOSIS — E66811 Obesity, class 1: Secondary | ICD-10-CM | POA: Diagnosis not present

## 2024-04-15 DIAGNOSIS — I700 Atherosclerosis of aorta: Secondary | ICD-10-CM | POA: Diagnosis not present

## 2024-04-15 DIAGNOSIS — K760 Fatty (change of) liver, not elsewhere classified: Secondary | ICD-10-CM | POA: Diagnosis not present

## 2024-04-15 DIAGNOSIS — Z6832 Body mass index (BMI) 32.0-32.9, adult: Secondary | ICD-10-CM | POA: Diagnosis not present

## 2024-04-15 DIAGNOSIS — I129 Hypertensive chronic kidney disease with stage 1 through stage 4 chronic kidney disease, or unspecified chronic kidney disease: Secondary | ICD-10-CM | POA: Diagnosis not present

## 2024-04-15 DIAGNOSIS — E559 Vitamin D deficiency, unspecified: Secondary | ICD-10-CM | POA: Diagnosis not present

## 2024-04-15 DIAGNOSIS — I471 Supraventricular tachycardia, unspecified: Secondary | ICD-10-CM | POA: Diagnosis not present

## 2024-04-15 DIAGNOSIS — G4733 Obstructive sleep apnea (adult) (pediatric): Secondary | ICD-10-CM | POA: Diagnosis not present

## 2024-04-30 ENCOUNTER — Other Ambulatory Visit: Payer: Self-pay | Admitting: Medical Oncology

## 2024-04-30 ENCOUNTER — Inpatient Hospital Stay: Admitting: Medical Oncology

## 2024-04-30 ENCOUNTER — Inpatient Hospital Stay: Attending: Hematology & Oncology

## 2024-04-30 DIAGNOSIS — Z880 Allergy status to penicillin: Secondary | ICD-10-CM | POA: Diagnosis not present

## 2024-04-30 DIAGNOSIS — D751 Secondary polycythemia: Secondary | ICD-10-CM | POA: Diagnosis not present

## 2024-04-30 DIAGNOSIS — G4730 Sleep apnea, unspecified: Secondary | ICD-10-CM | POA: Diagnosis not present

## 2024-04-30 DIAGNOSIS — G629 Polyneuropathy, unspecified: Secondary | ICD-10-CM | POA: Diagnosis not present

## 2024-04-30 DIAGNOSIS — Z7982 Long term (current) use of aspirin: Secondary | ICD-10-CM | POA: Insufficient documentation

## 2024-04-30 DIAGNOSIS — Z888 Allergy status to other drugs, medicaments and biological substances status: Secondary | ICD-10-CM | POA: Insufficient documentation

## 2024-04-30 DIAGNOSIS — R634 Abnormal weight loss: Secondary | ICD-10-CM | POA: Insufficient documentation

## 2024-04-30 DIAGNOSIS — K760 Fatty (change of) liver, not elsewhere classified: Secondary | ICD-10-CM | POA: Insufficient documentation

## 2024-04-30 DIAGNOSIS — Z79899 Other long term (current) drug therapy: Secondary | ICD-10-CM | POA: Diagnosis not present

## 2024-04-30 DIAGNOSIS — Z91040 Latex allergy status: Secondary | ICD-10-CM | POA: Insufficient documentation

## 2024-04-30 DIAGNOSIS — Z881 Allergy status to other antibiotic agents status: Secondary | ICD-10-CM | POA: Diagnosis not present

## 2024-04-30 DIAGNOSIS — Z885 Allergy status to narcotic agent status: Secondary | ICD-10-CM | POA: Diagnosis not present

## 2024-04-30 LAB — CMP (CANCER CENTER ONLY)
ALT: 23 U/L (ref 0–44)
AST: 28 U/L (ref 15–41)
Albumin: 4.3 g/dL (ref 3.5–5.0)
Alkaline Phosphatase: 81 U/L (ref 38–126)
Anion gap: 11 (ref 5–15)
BUN: 18 mg/dL (ref 8–23)
CO2: 25 mmol/L (ref 22–32)
Calcium: 9.3 mg/dL (ref 8.9–10.3)
Chloride: 107 mmol/L (ref 98–111)
Creatinine: 1.15 mg/dL — ABNORMAL HIGH (ref 0.44–1.00)
GFR, Estimated: 51 mL/min — ABNORMAL LOW (ref >60)
Glucose, Bld: 87 mg/dL (ref 70–99)
Potassium: 4.2 mmol/L (ref 3.5–5.1)
Sodium: 143 mmol/L (ref 135–145)
Total Bilirubin: 0.6 mg/dL (ref 0.0–1.2)
Total Protein: 6.9 g/dL (ref 6.5–8.1)

## 2024-04-30 LAB — CBC
HCT: 48.3 % — ABNORMAL HIGH (ref 36.0–46.0)
Hemoglobin: 16.2 g/dL — ABNORMAL HIGH (ref 12.0–15.0)
MCH: 29.8 pg (ref 26.0–34.0)
MCHC: 33.5 g/dL (ref 30.0–36.0)
MCV: 89 fL (ref 80.0–100.0)
Platelets: 191 K/uL (ref 150–400)
RBC: 5.43 MIL/uL — ABNORMAL HIGH (ref 3.87–5.11)
RDW: 14.6 % (ref 11.5–15.5)
WBC: 6.1 K/uL (ref 4.0–10.5)
nRBC: 0 % (ref 0.0–0.2)

## 2024-04-30 LAB — IRON AND IRON BINDING CAPACITY (CC-WL,HP ONLY)
Iron: 131 ug/dL (ref 28–170)
Saturation Ratios: 31 % (ref 10.4–31.8)
TIBC: 424 ug/dL (ref 250–450)
UIBC: 293 ug/dL

## 2024-04-30 LAB — FERRITIN: Ferritin: 34 ng/mL (ref 11–307)

## 2024-04-30 NOTE — Progress Notes (Signed)
 Hematology and Oncology Follow Up Visit  Shelby Harrington [id number] 06-14-1951 73 y.o. 04/30/2024   Principle Diagnosis:  Hemochromatosis - Heterozygous H63D mutation Polycythemia- JAK2 negative- secondary to sleep apnea   Current Therapy:        Aspirin 81 mg PO daily Phlebotomy to keep ferritin < 50 and iron sat < 30%    Interim History:  Shelby Harrington is here today for follow-up.   She reports that she has been well since our last visit. She has no concerns today.  She is UTD on eye exams Fatty Liver found on May 2025 abdominal US  Had a sleep study and was found to have mild sleep apnea. She is working with weight management and has lost some weight. She goes back in Dec for follow up.  No blood loss, bruising or petechiae.  No fever, chills, n/v, cough, rash, dizziness, SOB, chest pain, palpitations, abdominal pain or changes in bowel habits.  No swelling in her extremities at this time.  Neuropathy in her hands and feet waxes and wanes.  No falls or syncope reported.  Appetite and hydration are good.  Wt Readings from Last 3 Encounters:  04/30/24 162 lb (73.5 kg)  10/30/23 174 lb (78.9 kg)  07/17/23 172 lb 6.4 oz (78.2 kg)    ECOG Performance Status: 1 - Symptomatic but completely ambulatory  Medications:  Allergies as of 04/30/2024       Reactions   Prednisone Palpitations   Clindamycin/lincomycin Diarrhea   Latex Other (See Comments)   Skin blisters   Codeine Rash   Penicillins Rash   Tape Rash   Adhesive tape        Medication List        Accurate as of April 30, 2024 11:51 AM. If you have any questions, ask your nurse or doctor.          acetaminophen  500 MG tablet Commonly known as: TYLENOL  Take 500 mg by mouth every 6 (six) hours as needed for moderate pain.   ALPRAZolam 0.25 MG tablet Commonly known as: XANAX Take 0.25 mg by mouth 2 (two) times daily as needed for anxiety.   amLODipine 2.5 MG tablet Commonly known as: NORVASC Take  2.5 mg by mouth daily.   aspirin 81 MG tablet Take 81 mg by mouth daily.   atorvastatin 10 MG tablet Commonly known as: LIPITOR Take 10 mg by mouth at bedtime.   betamethasone valerate 0.1 % cream Commonly known as: VALISONE Apply 1 application. topically 2 (two) times daily as needed (rash).   CALCIUM PO Take 1,200 mg by mouth daily.   CENTRUM SILVER  PO Take 1 tablet by mouth daily.   Cetirizine  HCl 10 MG Caps Commonly known as: ZyrTEC  Allergy  One at bedtime   Contrave 8-90 MG Tb12 Generic drug: Naltrexone-buPROPion HCl ER Take 1 tablet by mouth daily.   Dialyvite Vitamin D 5000 125 MCG (5000 UT) capsule Generic drug: Cholecalciferol Take 5,000 Units by mouth daily.   estradiol 0.1 MG/GM vaginal cream Commonly known as: ESTRACE Place 1 Applicatorful vaginally 2 (two) times a week.   famotidine 20 MG tablet Commonly known as: PEPCID Take 20 mg by mouth daily as needed for heartburn or indigestion.   fluticasone 50 MCG/ACT nasal spray Commonly known as: FLONASE Place 2 sprays into both nostrils daily.   GLUCOSAMINE CHONDR COMPLEX PO Take 1 capsule by mouth daily as needed (rash).   losartan 25 MG tablet Commonly known as: COZAAR Take 12.5 mg by mouth daily.  metFORMIN 500 MG 24 hr tablet Commonly known as: GLUCOPHAGE-XR Take 500 mg by mouth every evening.   metoprolol  succinate 50 MG 24 hr tablet Commonly known as: TOPROL -XL TAKE WITH OR IMMEDIATELY FOLLOWING A MEAL.TAKE 25 MG (HALF TABLET) IN THE MORNING AND TAKE 50 MG (ONE TABLET) AT NIGHT.   traZODone 50 MG tablet Commonly known as: DESYREL Take 25-50 mg by mouth at bedtime.   vitamin C 1000 MG tablet Take 1,000 mg by mouth daily.        Allergies:  Allergies  Allergen Reactions   Prednisone Palpitations   Clindamycin/Lincomycin Diarrhea   Latex Other (See Comments)    Skin blisters   Codeine Rash   Penicillins Rash   Tape Rash    Adhesive tape    Past Medical History, Surgical  history, Social history, and Family History were reviewed and updated.  Review of Systems: All other 10 point review of systems is negative.   Physical Exam:  weight is 162 lb (73.5 kg). Her oral temperature is 98.1 F (36.7 C). Her blood pressure is 153/90 (abnormal) and her pulse is 50 (abnormal). Her respiration is 16 and oxygen saturation is 97%.   Wt Readings from Last 3 Encounters:  04/30/24 162 lb (73.5 kg)  10/30/23 174 lb (78.9 kg)  07/17/23 172 lb 6.4 oz (78.2 kg)    Ocular: Sclerae unicteric, pupils equal, round and reactive to light Ear-nose-throat: Oropharynx clear, dentition fair Lymphatic: No cervical or supraclavicular adenopathy Lungs no rales or rhonchi, good excursion bilaterally Heart regular rate and rhythm, no murmur appreciated Abd soft, nontender, positive bowel sounds MSK no focal spinal tenderness, no joint edema Neuro: non-focal, well-oriented, appropriate affect  Lab Results  Component Value Date   WBC 6.1 04/30/2024   HGB 16.2 (H) 04/30/2024   HCT 48.3 (H) 04/30/2024   MCV 89.0 04/30/2024   PLT 191 04/30/2024   Lab Results  Component Value Date   FERRITIN 26 01/29/2024   IRON 71 01/29/2024   TIBC 468 (H) 01/29/2024   UIBC 397 01/29/2024   IRONPCTSAT 15 01/29/2024   Lab Results  Component Value Date   RBC 5.43 (H) 04/30/2024   No results found for: KPAFRELGTCHN, LAMBDASER, KAPLAMBRATIO No results found for: IGGSERUM, IGA, IGMSERUM No results found for: STEPHANY CARLOTA BENSON MARKEL EARLA JOANNIE DOC VICK, SPEI   Chemistry      Component Value Date/Time   NA 143 04/30/2024 1110   NA 142 09/14/2021 0921   NA 148 (H) 06/29/2017 0813   NA 143 03/30/2017 0805   K 4.2 04/30/2024 1110   K 3.8 06/29/2017 0813   K 4.0 03/30/2017 0805   CL 107 04/30/2024 1110   CL 105 06/29/2017 0813   CO2 25 04/30/2024 1110   CO2 28 06/29/2017 0813   CO2 26 03/30/2017 0805   BUN 18 04/30/2024 1110   BUN 16  09/14/2021 0921   BUN 21 06/29/2017 0813   BUN 18.2 03/30/2017 0805   CREATININE 1.15 (H) 04/30/2024 1110   CREATININE 1.3 (H) 06/29/2017 0813   CREATININE 1.0 03/30/2017 0805      Component Value Date/Time   CALCIUM 9.3 04/30/2024 1110   CALCIUM 9.5 06/29/2017 0813   CALCIUM 9.6 03/30/2017 0805   ALKPHOS 81 04/30/2024 1110   ALKPHOS 78 06/29/2017 0813   ALKPHOS 73 03/30/2017 0805   AST 28 04/30/2024 1110   AST 29 03/30/2017 0805   ALT 23 04/30/2024 1110   ALT 32 06/29/2017 0813   ALT  26 03/30/2017 0805   BILITOT 0.6 04/30/2024 1110   BILITOT 0.67 03/30/2017 0805     Encounter Diagnosis  Name Primary?   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 Crafts is a very pleasant 73 yo caucasian female with hemochromatosis, heterozygous for the H63D mutation. She also has polycythemia- JAK2 negative 06/29/2017.   Hgb is stable at 16.2 today. HCT 48.3.  She will come back when she is better hydrated and has eaten well for phlebotomy  Continue follow up with her sleep team  RTC in 2-7 days for phlebotomy with fluid replacement RTC 3 months labs(CBC, iron, ferritin, TSH, CMP) RTC 6 months APP, labs (CBC, CMP, iron, ferritin)  Lauraine CHRISTELLA Dais, PA-C 10/28/202511:51 AM

## 2024-05-01 ENCOUNTER — Ambulatory Visit: Payer: Self-pay | Admitting: Medical Oncology

## 2024-05-03 ENCOUNTER — Inpatient Hospital Stay

## 2024-05-03 MED ORDER — SODIUM CHLORIDE 0.9 % IV SOLN: Freq: Once | INTRAVENOUS | Status: AC

## 2024-05-03 NOTE — Progress Notes (Signed)
 Pt. Arrived for Phlebotomy and post IVF today. Reported low pulse rate but asyptomatic. Pulse at 45 pre procedure. Phlebotomized 500g via empty bag method with 18g intracath. Pt. Monitored closely without issues. Post 500ml IVF given. Nourishment given post. Stable on d/c.

## 2024-05-03 NOTE — Patient Instructions (Signed)
 [facility] - A DEPT OF [facility]  Discharge Instructions: Thank you for choosing [facility] to provide your oncology and hematology care.   If you have a lab appointment with the Cancer Center, please go directly to the Cancer Center and check in at the registration area.  Wear comfortable clothing and clothing appropriate for easy access to any Portacath or PICC line.   We strive to give you quality time with your provider. You may need to reschedule your appointment if you arrive late (15 or more minutes).  Arriving late affects you and other patients whose appointments are after yours.  Also, if you miss three or more appointments without notifying the office, you may be dismissed from the clinic at the provider's discretion.      For prescription refill requests, have your pharmacy contact our office and allow 72 hours for refills to be completed.    Today you received the following  procedure: Therapeutic phlebotomy .      To help prevent nausea and vomiting after your treatment, we encourage you to take your nausea medication as directed.  BELOW ARE SYMPTOMS THAT SHOULD BE REPORTED IMMEDIATELY: *FEVER GREATER THAN 100.4 F (38 C) OR HIGHER *CHILLS OR SWEATING *NAUSEA AND VOMITING THAT IS NOT CONTROLLED WITH YOUR NAUSEA MEDICATION *UNUSUAL SHORTNESS OF BREATH *UNUSUAL BRUISING OR BLEEDING *URINARY PROBLEMS (pain or burning when urinating, or frequent urination) *BOWEL PROBLEMS (unusual diarrhea, constipation, pain near the anus) TENDERNESS IN MOUTH AND THROAT WITH OR WITHOUT PRESENCE OF ULCERS (sore throat, sores in mouth, or a toothache) UNUSUAL RASH, SWELLING OR PAIN  UNUSUAL VAGINAL DISCHARGE OR ITCHING   Items with * indicate a potential emergency and should be followed up as soon as possible or go to the Emergency Department if any problems should occur.  Please show the CHEMOTHERAPY ALERT CARD or IMMUNOTHERAPY ALERT CARD at  check-in to the Emergency Department and triage nurse. Should you have questions after your visit or need to cancel or reschedule your appointment, please contact [facility] - A DEPT OF [facility]  [phone] and follow the prompts.  Office hours are 8:00 a.m. to 4:30 p.m. Monday - Friday. Please note that voicemails left after 4:00 p.m. may not be returned until the following business day.  We are closed weekends and major holidays. You have access to a nurse at all times for urgent questions. Please call the main number to the clinic [phone] and follow the prompts.  For any non-urgent questions, you may also contact your provider using MyChart. We now offer e-Visits for anyone 45 and older to request care online for non-urgent symptoms. For details visit [email or web address].   Also download the MyChart app! Go to the app store, search "MyChart", open the app, select , and log in with your MyChart username and password.

## 2024-05-14 NOTE — Progress Notes (Unsigned)
 Cardiology Office Note:    Date:  05/16/2024   ID:  Shelby Harrington, DOB [birth year], MRN [id number]  PCP:  Teresa Channel, MD   [facility] Providers Cardiologist:  Dub Huntsman, DO     Referring MD: Bernard Drivers, MD   Chief Complaint  Patient presents with   Follow-up   Chest Pain   Bradycardia   Edema    Leg.   Shortness of Breath    With activity.    History of Present Illness:    Shelby Harrington is a 73 y.o. female with a hx of paroxysmal SVT on a heart monitor, hypertension, CKD stage III.  She is maintained on 50 mg Toprol  at night for paroxysmal SVT.  She reported chest tightness when last seen by Dr. Huntsman 07/17/2023.  Chest pain felt related to palpitations and repeat heart monitor was planned.  Heart monitor 08/2023 was overall unremarkable with occasional paroxysmal SVT.  Through shared decision making no changes in medications were attempted.  She presented to [facility] ED 03/04/2024 with constant, dull, pleuritic chest pain.  She ruled out with negative enzymes and nonischemic EKG.  She was discharged without admission.  CTA negative for PE but noted coronary artery calcification and aortic atherosclerosis.  She presents today for follow-up of chest pain. She describes right sided chest pain radiating to her back. The day before she was exercising at the fitness center - including recumbant bike, upper body weight machines and crunches. She has not been to the gym in the last three weeks. Exercise began several years ago, not a new program prior to chest pain. She was concerned because the right  sided chest pain felt like a pressure. Has since largely resolved, but she has not been exercising (not because of chest pain, but because of other life stressors).    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  Confirmed on 05/15 CS   WRIST SURGERY     ganglion removal x 2 as a teenager    Current Medications: Current Meds  Medication Sig   acetaminophen  (TYLENOL ) 500 MG tablet Take 500 mg by mouth every 6 (six) hours as needed for moderate pain.   ALPRAZolam (XANAX) 0.25 MG tablet Take 0.25 mg by mouth 2 (two) times daily as needed for anxiety.   amLODipine (NORVASC) 2.5 MG tablet Take 2.5 mg by mouth daily.   Ascorbic Acid (VITAMIN C) 1000 MG tablet Take 1,000 mg by mouth daily.   aspirin 81 MG tablet Take 81 mg by mouth daily.   atorvastatin (LIPITOR) 10 MG tablet Take 10 mg by mouth at bedtime.   betamethasone  valerate (VALISONE) 0.1 % cream Apply 1 application. topically 2 (two) times daily as needed (rash).   CALCIUM PO Take 1,200 mg by mouth daily.   Cetirizine  HCl (ZYRTEC  ALLERGY ) 10 MG CAPS One at bedtime   Cholecalciferol (DIALYVITE VITAMIN D 5000) 125 MCG (5000 UT) capsule Take 5,000 Units by mouth daily.   estradiol (ESTRACE) 0.1 MG/GM vaginal cream Place 1 Applicatorful vaginally 2 (two) times a week.   famotidine (PEPCID) 20 MG tablet Take 20 mg by mouth daily as needed for heartburn or indigestion.   fluticasone (FLONASE) 50 MCG/ACT nasal spray Place 2 sprays into both nostrils daily.   Glucosamine-Chondroitin (GLUCOSAMINE CHONDR  COMPLEX PO) Take 1 capsule by mouth daily as needed (rash).   losartan (COZAAR) 25 MG tablet Take 12.5 mg by mouth daily.   metFORMIN (GLUCOPHAGE-XR) 500 MG 24 hr tablet Take 500 mg by mouth every evening.   Multiple Vitamins-Minerals (CENTRUM SILVER  PO) Take 1 tablet by mouth daily.   Naltrexone-buPROPion HCl ER (CONTRAVE) 8-90 MG TB12 Take 1 tablet by mouth daily.   traZODone (DESYREL) 50 MG tablet Take 25-50 mg by mouth at bedtime.   [DISCONTINUED] metoprolol  succinate (TOPROL -XL) 50 MG 24 hr tablet TAKE WITH OR IMMEDIATELY FOLLOWING A MEAL.TAKE 25 MG (HALF TABLET) IN THE MORNING AND TAKE 50 MG (ONE TABLET) AT NIGHT.     Allergies:   Prednisone, Clindamycin/lincomycin, Latex, Codeine, Penicillins, and Tape   Social History   Socioeconomic History   Marital status: Married    Spouse name: Not on file   Number of children: Not on file   Years of education: Not on file   Highest education level: Not on file  Occupational History   Not on file  Tobacco Use   Smoking status: Never   Smokeless tobacco: Never  Vaping Use   Vaping status: Never Used  Substance and Sexual Activity   Alcohol use: Yes    Alcohol/week: 1.0 standard drink of alcohol    Types: 1 Glasses of wine per week    Comment: glass of wine on the weekends   Drug use: No   Sexual activity: Not on file  Other Topics Concern   Not on file  Social History Narrative   Not on file   Social Drivers of Health   Financial Resource Strain: Not on file  Food Insecurity: Not on file  Transportation Needs: Not on file  Physical Activity: Not on file  Stress: Not on file  Social Connections: Not on file     Family History: The patient's family history includes Breast cancer (age of onset: 78) in her mother; Diabetes in her father; Emphysema in her mother; Hypertension in her father and mother; Skin cancer in her father; Uterine cancer (age of onset: 48) in her sister.  ROS:   Please see the history of present  illness.     All other systems reviewed and are negative.  EKGs/Labs/Other Studies Reviewed:    The following studies were reviewed today:  EKG Interpretation Date/Time:  Thursday May 16 2024 08:03:55 EST Ventricular Rate:  44 PR Interval:  182 QRS Duration:  88 QT Interval:  462 QTC Calculation: 395 R Axis:   -36  Text Interpretation: Marked sinus bradycardia Left axis deviation Low voltage QRS Possible Anterolateral infarct , age undetermined When compared with ECG of 04-Mar-2024 12:14, PREVIOUS ECG IS PRESENT Confirmed by Shelby Harrington ([id number]) on 05/16/2024 8:08:10 AM    Recent Labs: 10/30/2023: TSH 1.270 04/30/2024: ALT 23; BUN 18; Creatinine  1.15; Hemoglobin 16.2; Platelets 191; Potassium 4.2; Sodium 143  Recent Lipid Panel No results found for: CHOL, TRIG, HDL, CHOLHDL, VLDL, LDLCALC, LDLDIRECT   Risk Assessment/Calculations:                Physical Exam:    VS:  BP 120/60 (BP Location: Left Arm, Patient Position: Sitting, Cuff Size: Normal)   Pulse (!) 44   Ht 4' 11.5 (1.511 m)   BMI 32.17 kg/m     Wt Readings from Last 3 Encounters:  04/30/24 162 lb (73.5 kg)  10/30/23 174 lb (78.9 kg)  07/17/23 172 lb 6.4 oz (78.2 kg)     GEN:  Well nourished, well developed in no acute distress HEENT: Normal NECK: No JVD; No carotid bruits LYMPHATICS: No lymphadenopathy CARDIAC: RRR, no murmurs, rubs, gallops RESPIRATORY:  Clear to auscultation without rales, wheezing or rhonchi  ABDOMEN: Soft, non-tender, non-distended MUSCULOSKELETAL:  No edema; No deformity  SKIN: Warm and dry NEUROLOGIC:  Alert and oriented x 3 PSYCHIATRIC:  Normal affect   ASSESSMENT:    1. PSVT (paroxysmal supraventricular tachycardia)   2. Palpitations   3. Chest pain of uncertain etiology   4. Sinus bradycardia   5. Primary hypertension   6. Hyperlipidemia with target LDL less than 70    PLAN:    In order of problems listed above:  Chest pain -Coronary artery  calcification and aortic atherosclerosis on recent CTA that ruled out PE - chest pain resolved, but has not been active -- mild cardiomegaly noted on CXR -- will start with echocardiogram, CT coronary in 2023 with calcium score zero -- father had CABG x4 in his 13s - Continue aspirin, statin, beta-blocker, amlodipine -- reduce BB dose to 50 mg toprol  at night   Paroxysmal SVT Sinus bradycardia - Maintained on 50 mg metoprolol  at night and 25 mg toprol  -- will reduce to 50 mg toprol  at night, stop the morning dose -- she will start magnesium taurate   Hypertension -Maintained on 12.5 mg losartan, 50 mg Toprol , 2.5 mg amlodipine   Hyperlipidemia with LDL less than 70 - lipid panel [id number] with LDL 56, Trig 97 --A1c 5.5%      Follow up in 3 months with Dr. Sheena, sooner if echo abnormal.     Medication Adjustments/Labs and Tests Ordered: Current medicines are reviewed at length with the patient today.  Concerns regarding medicines are outlined above.  Orders Placed This Encounter  Procedures   EKG 12-Lead   ECHOCARDIOGRAM COMPLETE   Meds ordered this encounter  Medications   metoprolol  succinate (TOPROL -XL) 50 MG 24 hr tablet    Sig: Take 1 tablet (50 mg total) by mouth at bedtime.    Dispense:  90 tablet    Refill:  2    Patient Instructions  Medication Instructions:  DECREASE METOPROLOL  SUCCINATE TO 50 MG AT BEDTIME. START TAKING OTC MAGNESIUM TAURATE 200-400 MG DAILY.  Lab Work: NONE TO BE DONE TODAY.  Testing/Procedures: Your physician has requested that you have an echocardiogram. Echocardiography is a painless test that uses sound waves to create images of your heart. It provides your doctor with information about the size and shape of your heart and how well your heart's chambers and valves are working. This procedure takes approximately one hour. There are no restrictions for this procedure. Please do NOT wear cologne, perfume, aftershave, or lotions  (deodorant is allowed). Please arrive 15 minutes prior to your appointment time.  Please note: We ask at that  you not bring children with you during ultrasound (echo/ vascular) testing. Due to room size and safety concerns, children are not allowed in the ultrasound rooms during exams. Our front office staff cannot provide observation of children in our lobby area while testing is being conducted. An adult accompanying a patient to their appointment will only be allowed in the ultrasound room at the discretion of the ultrasound technician under special circumstances. We apologize for any inconvenience.   Follow-Up: At [facility], you and your health needs are our priority.  As part of our continuing mission to provide you with exceptional heart care, our providers are all part of one team.  This team includes your primary Cardiologist (physician) and Advanced Practice Providers or APPs (Physician Assistants and Nurse Practitioners) who all work together to provide you with the care you need, when you need it.  Your next appointment:   3 MONTHS  Provider:   Kardie Tobb, DO        Signed, Jon Nat Hails, [place]  05/16/2024 9:16 AM    [facility]

## 2024-05-16 ENCOUNTER — Encounter: Payer: Self-pay | Admitting: Physician Assistant

## 2024-05-16 ENCOUNTER — Ambulatory Visit: Attending: Physician Assistant | Admitting: Physician Assistant

## 2024-05-16 VITALS — BP 120/60 | HR 44 | Ht 59.5 in

## 2024-05-16 DIAGNOSIS — I10 Essential (primary) hypertension: Secondary | ICD-10-CM | POA: Diagnosis not present

## 2024-05-16 DIAGNOSIS — E785 Hyperlipidemia, unspecified: Secondary | ICD-10-CM

## 2024-05-16 DIAGNOSIS — R002 Palpitations: Secondary | ICD-10-CM

## 2024-05-16 DIAGNOSIS — R079 Chest pain, unspecified: Secondary | ICD-10-CM | POA: Diagnosis not present

## 2024-05-16 DIAGNOSIS — I471 Supraventricular tachycardia, unspecified: Secondary | ICD-10-CM | POA: Diagnosis not present

## 2024-05-16 DIAGNOSIS — R001 Bradycardia, unspecified: Secondary | ICD-10-CM | POA: Diagnosis not present

## 2024-05-16 MED ORDER — METOPROLOL SUCCINATE ER 50 MG PO TB24: 50.0000 mg | ORAL_TABLET | Freq: Every evening | ORAL | 2 refills | Status: AC

## 2024-05-16 NOTE — Patient Instructions (Addendum)
 Medication Instructions:  DECREASE METOPROLOL  SUCCINATE TO 50 MG AT BEDTIME. START TAKING OTC MAGNESIUM TAURATE 200-400 MG DAILY.  Lab Work: NONE TO BE DONE TODAY.  Testing/Procedures: Your physician has requested that you have an echocardiogram. Echocardiography is a painless test that uses sound waves to create images of your heart. It provides your doctor with information about the size and shape of your heart and how well your heart's chambers and valves are working. This procedure takes approximately one hour. There are no restrictions for this procedure. Please do NOT wear cologne, perfume, aftershave, or lotions (deodorant is allowed). Please arrive 15 minutes prior to your appointment time.  Please note: We ask at that you not bring children with you during ultrasound (echo/ vascular) testing. Due to room size and safety concerns, children are not allowed in the ultrasound rooms during exams. Our front office staff cannot provide observation of children in our lobby area while testing is being conducted. An adult accompanying a patient to their appointment will only be allowed in the ultrasound room at the discretion of the ultrasound technician under special circumstances. We apologize for any inconvenience.   Follow-Up: At [facility], you and your health needs are our priority.  As part of our continuing mission to provide you with exceptional heart care, our providers are all part of one team.  This team includes your primary Cardiologist (physician) and Advanced Practice Providers or APPs (Physician Assistants and Nurse Practitioners) who all work together to provide you with the care you need, when you need it.  Your next appointment:   3 MONTHS  Provider:   Kardie Tobb, DO

## 2024-05-22 ENCOUNTER — Encounter: Payer: Self-pay | Admitting: Cardiology

## 2024-05-22 DIAGNOSIS — Z23 Encounter for immunization: Secondary | ICD-10-CM | POA: Diagnosis not present

## 2024-05-22 DIAGNOSIS — R911 Solitary pulmonary nodule: Secondary | ICD-10-CM | POA: Diagnosis not present

## 2024-05-22 DIAGNOSIS — E669 Obesity, unspecified: Secondary | ICD-10-CM | POA: Diagnosis not present

## 2024-05-22 DIAGNOSIS — I129 Hypertensive chronic kidney disease with stage 1 through stage 4 chronic kidney disease, or unspecified chronic kidney disease: Secondary | ICD-10-CM | POA: Diagnosis not present

## 2024-05-22 DIAGNOSIS — I471 Supraventricular tachycardia, unspecified: Secondary | ICD-10-CM | POA: Diagnosis not present

## 2024-05-22 DIAGNOSIS — N183 Chronic kidney disease, stage 3 unspecified: Secondary | ICD-10-CM | POA: Diagnosis not present

## 2024-05-22 DIAGNOSIS — I700 Atherosclerosis of aorta: Secondary | ICD-10-CM | POA: Diagnosis not present

## 2024-06-04 ENCOUNTER — Other Ambulatory Visit: Payer: Self-pay | Admitting: Family Medicine

## 2024-06-04 DIAGNOSIS — E66811 Obesity, class 1: Secondary | ICD-10-CM | POA: Diagnosis not present

## 2024-06-04 DIAGNOSIS — K760 Fatty (change of) liver, not elsewhere classified: Secondary | ICD-10-CM | POA: Diagnosis not present

## 2024-06-04 DIAGNOSIS — N183 Chronic kidney disease, stage 3 unspecified: Secondary | ICD-10-CM | POA: Diagnosis not present

## 2024-06-04 DIAGNOSIS — Z6832 Body mass index (BMI) 32.0-32.9, adult: Secondary | ICD-10-CM | POA: Diagnosis not present

## 2024-06-04 DIAGNOSIS — I471 Supraventricular tachycardia, unspecified: Secondary | ICD-10-CM | POA: Diagnosis not present

## 2024-06-04 DIAGNOSIS — E559 Vitamin D deficiency, unspecified: Secondary | ICD-10-CM | POA: Diagnosis not present

## 2024-06-04 DIAGNOSIS — F419 Anxiety disorder, unspecified: Secondary | ICD-10-CM | POA: Diagnosis not present

## 2024-06-04 DIAGNOSIS — E88819 Insulin resistance, unspecified: Secondary | ICD-10-CM | POA: Diagnosis not present

## 2024-06-04 DIAGNOSIS — I129 Hypertensive chronic kidney disease with stage 1 through stage 4 chronic kidney disease, or unspecified chronic kidney disease: Secondary | ICD-10-CM | POA: Diagnosis not present

## 2024-06-04 DIAGNOSIS — I700 Atherosclerosis of aorta: Secondary | ICD-10-CM | POA: Diagnosis not present

## 2024-06-04 DIAGNOSIS — Z1231 Encounter for screening mammogram for malignant neoplasm of breast: Secondary | ICD-10-CM

## 2024-06-04 DIAGNOSIS — G4733 Obstructive sleep apnea (adult) (pediatric): Secondary | ICD-10-CM | POA: Diagnosis not present

## 2024-06-20 ENCOUNTER — Ambulatory Visit (HOSPITAL_COMMUNITY): Admission: RE | Admit: 2024-06-20 | Discharge: 2024-06-20 | Attending: Physician Assistant

## 2024-06-20 DIAGNOSIS — I471 Supraventricular tachycardia, unspecified: Secondary | ICD-10-CM | POA: Insufficient documentation

## 2024-06-20 DIAGNOSIS — R600 Localized edema: Secondary | ICD-10-CM | POA: Diagnosis not present

## 2024-06-20 DIAGNOSIS — R072 Precordial pain: Secondary | ICD-10-CM | POA: Diagnosis not present

## 2024-06-20 DIAGNOSIS — R079 Chest pain, unspecified: Secondary | ICD-10-CM | POA: Insufficient documentation

## 2024-06-20 DIAGNOSIS — R0609 Other forms of dyspnea: Secondary | ICD-10-CM

## 2024-06-20 DIAGNOSIS — R002 Palpitations: Secondary | ICD-10-CM | POA: Insufficient documentation

## 2024-06-20 LAB — ECHOCARDIOGRAM COMPLETE
AR max vel: 1.81 cm2
AV Area VTI: 1.84 cm2
AV Area mean vel: 1.77 cm2
AV Mean grad: 5 mmHg
AV Peak grad: 9.4 mmHg
Ao pk vel: 1.53 m/s
Area-P 1/2: 1.96 cm2
S' Lateral: 1.92 cm

## 2024-06-25 ENCOUNTER — Ambulatory Visit: Payer: Self-pay | Admitting: Physician Assistant

## 2024-07-05 ENCOUNTER — Ambulatory Visit
Admission: RE | Admit: 2024-07-05 | Discharge: 2024-07-05 | Disposition: A | Discharge status: Home / Self Care | Source: Ambulatory Visit

## 2024-07-05 DIAGNOSIS — Z1231 Encounter for screening mammogram for malignant neoplasm of breast: Secondary | ICD-10-CM

## 2024-07-31 ENCOUNTER — Inpatient Hospital Stay: Attending: Hematology & Oncology

## 2024-07-31 LAB — CMP (CANCER CENTER ONLY)
ALT: 20 U/L (ref 0–44)
AST: 28 U/L (ref 15–41)
Albumin: 4.2 g/dL (ref 3.5–5.0)
Alkaline Phosphatase: 93 U/L (ref 38–126)
Anion gap: 12 (ref 5–15)
BUN: 16 mg/dL (ref 8–23)
CO2: 24 mmol/L (ref 22–32)
Calcium: 9.6 mg/dL (ref 8.9–10.3)
Chloride: 106 mmol/L (ref 98–111)
Creatinine: 1.19 mg/dL — ABNORMAL HIGH (ref 0.44–1.00)
GFR, Estimated: 49 mL/min — ABNORMAL LOW
Glucose, Bld: 104 mg/dL — ABNORMAL HIGH (ref 70–99)
Potassium: 4.3 mmol/L (ref 3.5–5.1)
Sodium: 142 mmol/L (ref 135–145)
Total Bilirubin: 0.4 mg/dL (ref 0.0–1.2)
Total Protein: 6.8 g/dL (ref 6.5–8.1)

## 2024-07-31 LAB — IRON AND IRON BINDING CAPACITY (CC-WL,HP ONLY)
Iron: 91 ug/dL (ref 28–170)
Saturation Ratios: 21 % (ref 10.4–31.8)
TIBC: 426 ug/dL (ref 250–450)
UIBC: 335 ug/dL

## 2024-07-31 LAB — CBC
HCT: 46.8 % — ABNORMAL HIGH (ref 36.0–46.0)
Hemoglobin: 15.1 g/dL — ABNORMAL HIGH (ref 12.0–15.0)
MCH: 28.9 pg (ref 26.0–34.0)
MCHC: 32.3 g/dL (ref 30.0–36.0)
MCV: 89.7 fL (ref 80.0–100.0)
Platelets: 218 10*3/uL (ref 150–400)
RBC: 5.22 MIL/uL — ABNORMAL HIGH (ref 3.87–5.11)
RDW: 13.6 % (ref 11.5–15.5)
WBC: 6.9 10*3/uL (ref 4.0–10.5)
nRBC: 0 % (ref 0.0–0.2)

## 2024-07-31 LAB — FERRITIN: Ferritin: 19 ng/mL (ref 11–307)

## 2024-08-06 ENCOUNTER — Ambulatory Visit: Payer: Self-pay | Admitting: Medical Oncology

## 2024-08-16 ENCOUNTER — Ambulatory Visit: Admitting: Cardiology

## 2024-10-29 ENCOUNTER — Inpatient Hospital Stay: Admitting: Medical Oncology

## 2024-10-29 ENCOUNTER — Inpatient Hospital Stay
